# Patient Record
Sex: Female | Born: 1955 | Race: White | Hispanic: No | Smoking: Never smoker
Health system: Southern US, Community
[De-identification: ages and names within clinical notes are randomized; demographics above are authoritative.]

## PROBLEM LIST (undated history)

## (undated) DIAGNOSIS — C801 Malignant (primary) neoplasm, unspecified: Secondary | ICD-10-CM

## (undated) DIAGNOSIS — S060XAA Concussion with loss of consciousness status unknown, initial encounter: Secondary | ICD-10-CM

## (undated) DIAGNOSIS — K219 Gastro-esophageal reflux disease without esophagitis: Secondary | ICD-10-CM

## (undated) HISTORY — DX: Concussion with loss of consciousness status unknown, initial encounter: S06.0XAA

## (undated) HISTORY — PX: DILATION AND CURETTAGE OF UTERUS: SHX78

## (undated) HISTORY — PX: LAPAROSCOPIC OVARIAN: SHX5906

## (undated) HISTORY — PX: ABDOMINAL HYSTERECTOMY: SHX81

## (undated) HISTORY — PX: TONSILLECTOMY: SUR1361

---

## 2001-10-06 ENCOUNTER — Encounter (INDEPENDENT_AMBULATORY_CARE_PROVIDER_SITE_OTHER): Payer: Self-pay

## 2001-10-06 ENCOUNTER — Ambulatory Visit (HOSPITAL_COMMUNITY): Admission: RE | Admit: 2001-10-06 | Discharge: 2001-10-06 | Payer: Self-pay | Admitting: Obstetrics and Gynecology

## 2001-10-06 ENCOUNTER — Encounter (INDEPENDENT_AMBULATORY_CARE_PROVIDER_SITE_OTHER): Payer: Self-pay | Admitting: Specialist

## 2001-10-20 ENCOUNTER — Ambulatory Visit: Admission: RE | Admit: 2001-10-20 | Discharge: 2001-10-20 | Payer: Self-pay | Admitting: Gynecology

## 2001-10-30 ENCOUNTER — Encounter: Payer: Self-pay | Admitting: Obstetrics and Gynecology

## 2001-11-03 ENCOUNTER — Inpatient Hospital Stay (HOSPITAL_COMMUNITY): Admission: RE | Admit: 2001-11-03 | Discharge: 2001-11-05 | Payer: Self-pay | Admitting: Obstetrics and Gynecology

## 2001-11-03 ENCOUNTER — Encounter (INDEPENDENT_AMBULATORY_CARE_PROVIDER_SITE_OTHER): Payer: Self-pay | Admitting: Specialist

## 2001-12-22 ENCOUNTER — Ambulatory Visit: Admission: RE | Admit: 2001-12-22 | Discharge: 2001-12-22 | Payer: Self-pay | Admitting: Gynecology

## 2002-05-04 ENCOUNTER — Ambulatory Visit (HOSPITAL_COMMUNITY): Admission: RE | Admit: 2002-05-04 | Discharge: 2002-05-04 | Payer: Self-pay | Admitting: Family Medicine

## 2002-05-04 ENCOUNTER — Encounter: Payer: Self-pay | Admitting: Family Medicine

## 2002-10-14 ENCOUNTER — Other Ambulatory Visit: Admission: RE | Admit: 2002-10-14 | Discharge: 2002-10-14 | Payer: Self-pay | Admitting: Obstetrics and Gynecology

## 2003-04-20 ENCOUNTER — Other Ambulatory Visit: Admission: RE | Admit: 2003-04-20 | Discharge: 2003-04-20 | Payer: Self-pay | Admitting: Obstetrics and Gynecology

## 2003-10-07 ENCOUNTER — Other Ambulatory Visit: Admission: RE | Admit: 2003-10-07 | Discharge: 2003-10-07 | Payer: Self-pay | Admitting: Obstetrics and Gynecology

## 2004-03-23 ENCOUNTER — Other Ambulatory Visit: Admission: RE | Admit: 2004-03-23 | Discharge: 2004-03-23 | Payer: Self-pay | Admitting: Obstetrics and Gynecology

## 2004-10-25 ENCOUNTER — Other Ambulatory Visit: Admission: RE | Admit: 2004-10-25 | Discharge: 2004-10-25 | Payer: Self-pay | Admitting: Obstetrics and Gynecology

## 2005-05-10 ENCOUNTER — Other Ambulatory Visit: Admission: RE | Admit: 2005-05-10 | Discharge: 2005-05-10 | Payer: Self-pay | Admitting: Obstetrics and Gynecology

## 2008-10-16 ENCOUNTER — Encounter: Admission: RE | Admit: 2008-10-16 | Discharge: 2008-10-16 | Payer: Self-pay | Admitting: Orthopedic Surgery

## 2010-08-12 ENCOUNTER — Encounter: Payer: Self-pay | Admitting: Orthopedic Surgery

## 2010-12-07 NOTE — Op Note (Signed)
[facility]  Patient:    Chelsea Bell, Chelsea Bell Visit Number: [id number] MRN: [id number]          Service Type: GYN Location: 1S X004 01 Attending Physician:  Soledad Gerlach Dictated by:   Rande Brunt Clarke-Pearson, M.D. Proc. Date: 11/03/01 Admit Date:  11/03/2001   CC:         Guy Sandifer. Arleta Creek, M.D.  Telford Nab, R.N.   Operative Report  PREOPERATIVE DIAGNOSIS:  Grade I endometrial adenocarcinoma.  POSTOPERATIVE DIAGNOSIS:  Grade I endometrial adenocarcinoma.  PROCEDURE:  Exploratory laparotomy. total abdominal hysterectomy, right salpingo-oophorectomy.  SURGEON:  Daniel L. Clarke-Pearson, M.D.  ASSISTANT:  Guy Sandifer. Arleta Creek, M.D.  ANESTHESIA:  General with orotracheal tube.  ESTIMATED BLOOD LOSS:  150 cc.  SURGICAL FINDINGS:  At the time of exploratory laparotomy, the upper abdomen including the liver diaphragms, stomach, and spleen were normal.  The omentum was adherent to an umbilical hernia which was easily released.  The pelvis revealed a recent prior left salpingo-oophorectomy.  There was a small cyst on the right ovary.  The uterus is normal size.  On frozen section, the pathologist found that the endometrial carcinoma was grade I with no obvious invasion of the myometrium.  DESCRIPTION OF PROCEDURE:  The patient was brought to the operating room and after satisfactory attainment of general anesthesia, was placed in the modified lithotomy position in [place] stirrups.  The anterior abdominal wall, perineum, and vagina were prepped with Betadine; a Foley catheter was placed, and the patient was draped.  The abdomen was entered through a low midline incision.  Peritoneal washings were obtained from the pelvis.  The abdomen and pelvis were explored with the above-noted findings.  Bookwalter retractor was assembled, and the bowel was packed out of the pelvis.  In the course of packing the bowel, the omentum was released  from its attachments to a small umbilical hernia.  The uterus was grasped with long Kelly clamps, and the round ligaments were divided.  The retroperitoneal space is opened, identifying the vessels and ureter.  The ovarian vessels were skeletonized, clamped, cut, free tied,  and suture ligated on both sides.  The bladder flap was advanced with sharp and blunt dissection.  The uterine vessels were skeletonized, clamped, cut, and suture ligated.  In a stepwise fashion, the paracervical and cardinal ligaments were clamped, cut, and suture ligated. The rectovaginal septum was developed and the cross-clamped incorporating the uterosacral ligaments.  The vaginal angles were transfixed with 0 Vicryl and the central portion of the vagina closed with interrupted figure-of-eight sutures of 0 Vicryl.  The cervix was inspected and had been removed in its entirety.  The specimen was submitted to frozen section, returning, and showing no evidence of myometrial invasion.  Given these very good prognostic features, it was deemed that lymph node dissection would not be performed given the low yield of such a procedure and Grade I endometrial cancer.  The packs and retractors were removed after hemostasis was achieved in the pelvis.  The umbilical hernia was closed with two figure-of-eight sutures of 0 Prolene.  The anterior abdominal wall was then closed in layers, the first being a running mass closure using #1 PDS.  Subcutaneous tissue was irrigated, hemostasis achieved with cautery, and then the subcutaneous tissue was reapproximated using interrupted sutures of 3-0 Vicryl.  The skin was closed with a running subcuticular suture of 3-0 Vicryl.  Steri-Strips were applied. The patient was awakened from anesthesia and taken  to the recovery room in satisfactory condition.  Sponge, needle, and instrument counts correct x 2. Dictated by:   Rande Brunt. Clarke-Pearson, M.D. Attending Physician:  Soledad Gerlach DD:  11/03/01 TD:  11/03/01 Job: [id number]

## 2010-12-07 NOTE — H&P (Signed)
[facility]  Patient:    Chelsea Bell, Chelsea Bell Visit Number: [id number] MRN: [id number]          Service Type: GYN Location: 1S X004 01 Attending Physician:  Soledad Gerlach Dictated by:   Guy Sandifer Arleta Creek, M.D. Admit Date:  11/03/2001                           History and Physical  CHIEF COMPLAINT:  Endometrial adenocarcinoma.  HISTORY OF PRESENT ILLNESS:  The patient is a 55 year old married white female, G0, P0, who underwent hysteroscopy, dilatation and curettage, and laparoscopy with left salpingo-oophorectomy, and ablation of endometriosis, and lysis of adhesions on October 06, 2001, at [facility].  The pathology report was consistent with a mature cystic teratoma and benign fallopian tube of the left ovary.  The endometrial curetting and endometrial polyp were both consistent with a well-differentiated endometrial adenocarcinoma, grade 1, as well as complex atypical hyperplasia.  The patient has been healing well from this surgery.  Consultation with Dr. Stanford Breed has been carried out.  She is now being admitted for total abdominal hysterectomy, right salpingo-oophorectomy, and possible lymph node dissection.  PAST MEDICAL HISTORY: 1. "Borderline" asthma, Dr. Jetty Duhamel. 2. Intermittent constipation. 3. History of infertility. 4. Superficial varicosities of lower extremities.  PAST SURGICAL HISTORY: 1. Laparoscopy and hysteroscopy as above. 2. Tonsillectomy.  MEDICATIONS:  Ambien p.r.n., Claritin D p.r.n., Beconase nasal spray p.r.n., multivitamin, Tylenol daily, Zantac p.r.n.  ALLERGIES:  CODEINE leading to headache, DARVOCET itching, AMOXICILLIN itching and nickel contact dermatitis.  FAMILY HISTORY:  Diabetes in mother and both grandmothers, chronic hypertension in mother and possibly in sisters, heart disease in paternal aunt, asthma in sister, thyroid disease in mother, osteoporosis in her family.  SOCIAL  HISTORY:  The patient denies tobacco, alcohol, or drug abuse.  REVIEW OF SYSTEMS:  Negative except as above.  PHYSICAL EXAMINATION:  VITAL SIGNS:  Height 5 feet and 5-1/2 inches, weight 288 pounds, blood pressure 138/84.  HEENT:  Without thyromegaly.  LUNGS:  Clear to auscultation.  HEART:  Regular rate and rhythm.  BACK:  Without CVA tenderness.  BREASTS:  Without mass, retraction, or discharge.  ABDOMEN:  Obese, soft, and nontender without masses.  PELVIC:  Vulva, vagina, and cervix without lesion.  Uterus normal size and nontender.  Adnexa nontender without masses.  RECTAL:  Without masses.  EXTREMITIES:  Grossly within normal limits.  NEUROLOGIC:  Grossly within normal limits.  ASSESSMENT:  Endometrial adenocarcinoma, grade 1.  PLAN:  Total abdominal hysterectomy and bilateral salpingo-oophorectomy and possible lymph node dissection. Dictated by:   Guy Sandifer Arleta Creek, M.D. Attending Physician:  Soledad Gerlach DD:  11/02/01 TD:  11/02/01 Job: [id number]

## 2010-12-07 NOTE — Consult Note (Signed)
[facility]  Patient:    Chelsea Bell, Chelsea Bell Visit Number: [id number] MRN: [id number]          Service Type: DSU Location: [facility] Attending Physician:  Soledad Gerlach Dictated by:   Rande Brunt. Clarke-Pearson, M.D. Proc. Date: 10/20/01 Admit Date:  10/06/2001 Discharge Date: 10/06/2001   CC:         Fayrene Fearing E. Arleta Creek, M.D.  Telford Nab, R.N.   Consultation Report  A 55 year old white female referred by Dr. Harold Hedge for consultation regarding management of a newly diagnosed grade 1 endometrial carcinoma.  The patient has had several months of menorrhagia recently undergoing an ultrasound which showed an ovarian mass.  Subsequently, the patient underwent laparoscopic left salpingo-oophorectomy, biopsy of the posterior cul-de-sac, and a hysteroscopy with D&C.  Final pathology showed a well differentiated endometrial adenocarcinoma associated with complex atypical hyperplasia. Cul-de-sac biopsies showed endometriosis and the ovary was a mature cystic teratoma.  The patient has had an uncomplicated postoperative course.  PAST MEDICAL HISTORY:  Obesity.  CURRENT MEDICATIONS:  Beconase, Claritin, Ambien, Zantac p.r.n., multivitamin.  ALLERGIES:  AMOXICILLIN, DARVOCET (itch), PERCOCET (headache), CODEINE (headache).  PAST SURGICAL HISTORY:  Laparoscopy, hysteroscopy, D&C, tonsils and adenoidectomy.  PAST OBSTETRICAL HISTORY:  Gravida 0.  FAMILY HISTORY:  Negative for gynecologic, breast, or colon cancer.  REVIEW OF SYSTEMS:  Otherwise negative.  PHYSICAL EXAMINATION  VITAL SIGNS:  Weight 284 pounds, height 5 feet 5 inches.  GENERAL:  The patient is an obese, white female in no acute distress.  HEENT:  Negative.  NECK:  Supple without thyromegaly.  LYMPH:  There is no supraclavicular or inguinal adenopathy.  ABDOMEN:  Soft, nontender.  No mass, organomegaly, ascites, or hernias are noted.  PELVIC:  EGBUS normal.   Vagina is clean, well supported.  Bimanual and rectovaginal examination reveal no masses, induration, or nodularity.  IMPRESSION:  Grade 1 endometrial carcinoma in an obese white female.  Would recommend the patient undergo definitive therapy with total abdominal hysterectomy and right salpingo-oophorectomy, peritoneal washings, and possible pelvic and periaortic lymphadenectomy if she has deep myometrial invasion.  We will coordinate surgery with Dr. Henderson Cloud.  The risks of surgery including hemorrhage, infection, injury to adjacent viscera, thromboembolic complications, and increased risk of wound breakdown and infection in an obese patient were outlined.  The patient is aware of the anesthetic risks as well.  All of her questions are answered.  She wished to proceed with surgery when we can coordinate the surgery with Dr. Harold Hedge. Dictated by:   Rande Brunt. Clarke-Pearson, M.D. Attending Physician:  Soledad Gerlach DD:  10/20/01 TD:  10/21/01 Job: [id number]

## 2010-12-07 NOTE — Discharge Summary (Signed)
[facility]  Patient:    Chelsea Bell, ADCOX Visit Number: [id number] MRN: [id number]          Service Type: GYN Location: 4W 0473 01 Attending Physician:  Soledad Gerlach Dictated by:   Guy Sandifer Arleta Creek, M.D. Admit Date:  11/03/2001 Discharge Date: 11/05/2001                             Discharge Summary  ADMISSION DIAGNOSIS:  Grade I endometrial carcinoma.  DISCHARGE DIAGNOSIS:  Grade I endometrial carcinoma.  PROCEDURES:  On November 03, 2001, total abdominal hysterectomy with right salpingo-oophorectomy.  REASON FOR ADMISSION:  This patient is a 55 year old married white female, G0, P0, status post hysteroscopy D&C and laparoscopic LSO with the postoperative diagnoses of a mature cystic teratoma and well-differentiated endometrial adenocarcinoma, grade I, with complex atypical hyperplasia.  She is being admitted for surgical management.  HOSPITAL COURSE:   The patient was admitted to the hospital and underwent the above procedure with an estimated blood loss of 150 cc.  She tolerated the procedure well.  On the evening of surgery, she was complaining of some itching with her Dilaudid PCA but has good pain relief.  Urine output is clear.  Vital signs are stable.  She is changed to a morphine PCA.  On the first postoperative day, she has good pain relief, complains of some sinus congestion.  She remains afebrile.  Abdomen is soft with good bowel sounds.  On the evening of November 04, 2001, she is tolerating a regular diet but not yet passing flatus.  She has remained afebrile with stable vital signs and good urine output.  On the day of discharge on November 05, 2001, she is passing flatus, ambulating, and doing well.  CONDITION UPON DISCHARGE:  Good.  DIET:  Regular as tolerated.  ACTIVITY:  No lifting, operation of automobiles, or vaginal entry.  SPECIAL INSTRUCTIONS:  She is to call the office for problems including but not  limited to temperature of 101 or greater, persistent nausea and vomiting, increasing pain, or heavy vaginal bleeding.  DISCHARGE MEDICATIONS: 1. Demerol 50 mg, #20, 1 to 2 p.o. q.6h. p.r.n. 2. Ibuprofen 600 mg p.o. q.6h. p.r.n. 3. Multivitamins daily.  FOLLOWUP:  In office in two weeks. Dictated by:   Guy Sandifer Arleta Creek, M.D. Attending Physician:  Soledad Gerlach DD:  11/26/01 TD:  11/29/01 Job: [id number]

## 2010-12-07 NOTE — Consult Note (Signed)
[facility]  Patient:    Chelsea Bell, Chelsea Bell Visit Number: [id number] MRN: [id number]          Service Type: GON Location: GYN Attending Physician:  Jeannette Corpus Dictated by:   Rande Brunt Clarke-Pearson, M.D. Proc. Date: 12/22/01 Admit Date:  12/22/2001 Discharge Date: 12/22/2001   CC:         Fayrene Fearing E. Arleta Creek, M.D.  Telford Nab, R.N.   Consultation Report  A 55 year old white female returns for postoperative follow-up having undergone a total abdominal hysterectomy, bilateral salpingo-oophorectomy, and peritoneal washings on April 15.  Final pathology showed she had a grade 1 endometrial carcinoma which had 1 mm of invasion.  She has had an uncomplicated postoperative course, although she noted a slight amount of erythema lateral to her umbilicus yesterday.  She denies any fever, chills, or any pain in this area.  She denies any vaginal bleeding or discharge.  Her functional status is very good.  REVIEW OF SYSTEMS:  Reviewed and unchanged.  FAMILY HISTORY:  Reviewed and unchanged.  SOCIAL HISTORY:  Reviewed and unchanged.  Patient comes accompanied by her husband.  PHYSICAL EXAMINATION  VITAL SIGNS:  Weight 285 pounds, blood pressure 138/84.  GENERAL:  The patient is a healthy, obese white female in no acute distress.  HEENT:  Negative.  NECK:  Supple without thyromegaly.  LYMPH:  There is no supraclavicular or inguinal adenopathy.  ABDOMEN:  Obese, soft, nontender.  No mass, organomegaly, ascites, or hernias are noted.  Midline incision is healing well.  There is a slight amount of induration and minimal erythema just to the left of her upper pole of her incision.  This may represent an early cellulitis.  PELVIC:  EGBUS normal.  Vagina is clean, well supported.  Cuff is healing nicely.  Bimanual and rectovaginal examinations reveal no masses, induration, or nodularity.  IMPRESSION:  Stage IB grade 1 endometrial  adenocarcinoma.  Patient has had good postoperative recovery and can return to full levels of activity.  Erythema near the wound.  This may be a cellulitis and therefore I will place the patient on Keflex 500 mg t.i.d. for the next week.  She is scheduled to follow up with Dr. Henderson Cloud.  Despite the fact she has a low risk lesion, I recommend she have an examination by Dr. Henderson Cloud every three months in the first year, every four months in the second year, and every six months to finish five years of follow-up.  I would be happy to see the patient back in reconsultation if needed. Dictated by:   Rande Brunt. Clarke-Pearson, M.D. Attending Physician:  Jeannette Corpus DD:  12/22/01 TD:  12/24/01 Job: [id number]

## 2010-12-07 NOTE — Op Note (Signed)
[facility]  Patient:    Chelsea Bell, Chelsea Bell Visit Number: [id number] MRN: [id number]          Service Type: DSU Location: [facility] Attending Physician:  Soledad Gerlach Dictated by:   Guy Sandifer Arleta Creek, M.D. Proc. Date: 10/06/01 Admit Date:  10/06/2001                             Operative Report  PREOPERATIVE DIAGNOSES:       1. Left ovarian cyst.                               2. Menometrorrhagia.  POSTOPERATIVE DIAGNOSES:      1. Bilateral ovarian cysts.                               2. Endometriosis.                               3. Menometrorrhagia.                               4. Pelvic adhesions.  OPERATION:                    1. Laparoscopy with left salpingo-oophorectomy,                                  resection and ablation of endometriosis, and                                  lysis of adhesions.                               2. Hysteroscopy with resection of endometrial                                  polyp and sharp curettage.  SURGEON:                      Guy Sandifer. Arleta Creek, M.D.  ANESTHESIA:                   General with endotracheal intubation - Raul Del, M.D.  ESTIMATED BLOOD LOSS:         Less than 100 cc.  I&Os:                         Sorbitol distending media, 200 cc deficit with                               some of that being on the floor.  INDICATIONS AND CONSENT:      The patient is a 55 year old married  white female with a history of worsening menometrorrhagia.  Ultrasound reveals possibility of endometrial polypoid-type structures.  There is also a left ovarian cyst, possibly consistent with a dermoid.  Laparoscopy with probable left salpingo-oophorectomy, hysteroscopy with resectoscope, and D&C was discussed with the patient.  Potential risks and complications have been discussed including, but not limited to infection, bowel, bladder, or ureteral damage,  bleeding requiring transfusion of blood products with possible transfusion reaction, HIV and hepatitis acquisition, DVT, PE, pneumonia, hysterectomy, intrauterine synechia formation.  All questions have been answered and consent signed on the chart.  FINDINGS:                     There are multiple polypoid-type structures in the uterine cavity.  Abdominally, the upper abdomen appeared grossly normal. The uterus is normal in size and contour.  The fallopian tubes are normal bilaterally.  The right ovary contains an approximately 2 cm smooth translucent cyst.  The left ovary contains a 3 cm smooth translucent cyst. There was a small adhesion from the bowel to the left ovary.  There is a heavy implant of endometriosis in the apex of the posterior cul-de-sac.  There are two small implants of endometriosis in the right aspect of the posterior cul-de-sac.  There are some adhesions of the bowel to the right pelvic brim. Anterior cul-de-sac is clean.  DESCRIPTION OF PROCEDURE:     The patient is taken to the operating room and placed in the dorsal supine position where general anesthesia was induced via endotracheal intubation.  She was then placed in the dorsolithotomy position where she was prepped abdominally and vaginally.  The bladder was straight catheterized and she was draped in a sterile fashion.  The patient received preoperative IV antibiotics.  A bivalve speculum was placed and the anterior cervical lip was injected with 1% Xylocaine and then grasped with a single tooth tenaculum.  A paracervical block was then placed in the 2, 4, 5, 7, 8, and 10 oclock positions with approximately 20 cc total of 1% Xylocaine.  The surface was gently and progressively dilated to a 33 Pratt dilator.  The resectoscope was then placed in the cervical os and advanced under direct visualization using sorbitol distending media.  Due to the bleeding the patient had, it was difficult to get a  consistent clear picture of the entire endometrial cavity at one time.  The picture would intermittent clear and then become bloody again.  Therefore, the hysteroscope was withdrawn and sharp curettage was carried out.  Reinspection reveals a remaining polypoid-type structure in the mid posterior endometrial wall.  This was resected with the resectoscope.  Sharp curettage was again carried out and reinspection reveals the cavity to be clean.  The single tooth tenaculum was replaced with a Hulka tenaculum and attention was turned to the abdomen.  An infraumbilical incision was made and a 10-11 disposable trocar sleeve was placed on the first attempt without difficulty.  Inspection with the laparoscope reveals good placement and no damage to surrounding structures is noted.  Pneumoperitoneum is induced and then a suprapubic and later left lower quadrant incisions are made after transillumination and then a 5 mm nondisposable trocar sleeves are placed under direct visualization without difficulty.  The above findings are noted.  The implant of endometriosis in the posterior cul-de-sac is sharply resected and sent for pathology.  Bipolar cautery was used to obtain hemostasis as well as to cauterize the remaining implants of endometriosis.  The adhesion of  the left ovary was taken down. The right ovary is aspirated for serous fluid which is sent for cytology.  The adhesions of the right pelvic brim are taken down sharply and cautery is used for hemostasis.  Careful inspection of the left ovary reveals the infundibulopelvic ligament to be very mobile.  The ureter is well clear of the area of surgery.  Bipolar cautery was then used to take down the infundibulopelvic ligament followed by the mesosalpinx, and the fallopian tube, and the utero-ovarian ligament in a step-wise fashion using a scissor to release the pedicles.  The cyst on the ovary is then aspirated to debulk the ovary.  This is serous  fluid which is also sent for cytology.  Then, using the 5 mm laparoscope through the left lower trocar sleeve, the Endocatch was used to remove the ovary through the umbilical incision.  Reinspection with the operative laparoscope reveals good hemostasis.  Excess fluid is removed.  Inferior trocar sleeves are removed.  Pneumoperitoneum is reduced and no bleeding is noted from any site.  The umbilical trocar sleeve is removed.  A 0 Vicryl is used to reapproximate the fascia and the umbilical incision with care being taken not to pick up any underlying structures.  The skin incisions were closed with subcuticular 3-0 Vicryl suture.  One-half percent plain Marcaine was injected into the incisions.  The Hulka tenaculum was removed and good hemostasis was noted.  All counts were correct.  The patient was awakened and taken to the recovery room in stable condition. Dictated by:   Guy Sandifer Arleta Creek, M.D. Attending Physician:  Soledad Gerlach DD:  10/06/01 TD:  10/07/01 Job: [id number]

## 2011-12-04 ENCOUNTER — Telehealth (INDEPENDENT_AMBULATORY_CARE_PROVIDER_SITE_OTHER): Payer: Self-pay | Admitting: *Deleted

## 2011-12-04 NOTE — Telephone Encounter (Signed)
Patient's insurance has changed and she is now getting her medication at [facility] in [place] instead of mail order. Chelsea Bell is needing a refill on her Omeprazole 40 mg. Please check on this she could not remember the name only the first 2 letters. The medication is for acid reflux. Patient's return phone number is [phone].

## 2011-12-11 NOTE — Telephone Encounter (Signed)
Information was put in wrong chart.

## 2013-07-25 ENCOUNTER — Other Ambulatory Visit (HOSPITAL_COMMUNITY): Payer: Self-pay | Admitting: Chiropractic Medicine

## 2013-07-25 DIAGNOSIS — M79641 Pain in right hand: Secondary | ICD-10-CM

## 2013-07-25 DIAGNOSIS — M25562 Pain in left knee: Secondary | ICD-10-CM

## 2013-07-26 ENCOUNTER — Ambulatory Visit (HOSPITAL_COMMUNITY)
Admission: RE | Admit: 2013-07-26 | Discharge: 2013-07-26 | Disposition: A | Discharge status: Home / Self Care | Payer: BC Managed Care – PPO | Source: Ambulatory Visit | Attending: Chiropractic Medicine | Admitting: Chiropractic Medicine

## 2013-07-26 ENCOUNTER — Other Ambulatory Visit (HOSPITAL_COMMUNITY): Payer: Self-pay | Admitting: Chiropractic Medicine

## 2013-07-26 DIAGNOSIS — M259 Joint disorder, unspecified: Secondary | ICD-10-CM | POA: Insufficient documentation

## 2013-07-26 DIAGNOSIS — R52 Pain, unspecified: Secondary | ICD-10-CM

## 2013-07-26 DIAGNOSIS — M25539 Pain in unspecified wrist: Secondary | ICD-10-CM | POA: Insufficient documentation

## 2013-07-26 DIAGNOSIS — M79609 Pain in unspecified limb: Secondary | ICD-10-CM | POA: Insufficient documentation

## 2014-05-27 ENCOUNTER — Encounter (HOSPITAL_COMMUNITY): Payer: Self-pay | Admitting: *Deleted

## 2014-06-09 ENCOUNTER — Encounter (HOSPITAL_COMMUNITY)
Admission: RE | Disposition: A | Discharge status: Home / Self Care | Payer: BC Managed Care – PPO | Source: Ambulatory Visit | Attending: Gastroenterology

## 2014-06-09 ENCOUNTER — Ambulatory Visit (HOSPITAL_COMMUNITY)
Admission: RE | Admit: 2014-06-09 | Discharge: 2014-06-09 | Disposition: A | Discharge status: Home / Self Care | Payer: BC Managed Care – PPO | Source: Ambulatory Visit | Attending: Gastroenterology | Admitting: Gastroenterology

## 2014-06-09 ENCOUNTER — Ambulatory Visit (HOSPITAL_COMMUNITY): Payer: BC Managed Care – PPO | Admitting: Anesthesiology

## 2014-06-09 ENCOUNTER — Encounter (HOSPITAL_COMMUNITY): Payer: Self-pay | Admitting: *Deleted

## 2014-06-09 DIAGNOSIS — K5730 Diverticulosis of large intestine without perforation or abscess without bleeding: Secondary | ICD-10-CM | POA: Diagnosis not present

## 2014-06-09 DIAGNOSIS — Z1211 Encounter for screening for malignant neoplasm of colon: Secondary | ICD-10-CM | POA: Insufficient documentation

## 2014-06-09 DIAGNOSIS — K219 Gastro-esophageal reflux disease without esophagitis: Secondary | ICD-10-CM | POA: Diagnosis not present

## 2014-06-09 HISTORY — PX: COLONOSCOPY WITH PROPOFOL: SHX5780

## 2014-06-09 HISTORY — DX: Gastro-esophageal reflux disease without esophagitis: K21.9

## 2014-06-09 HISTORY — DX: Malignant (primary) neoplasm, unspecified: C80.1

## 2014-06-09 SURGERY — COLONOSCOPY WITH PROPOFOL
Anesthesia: Monitor Anesthesia Care

## 2014-06-09 MED ORDER — LACTATED RINGERS IV SOLN
INTRAVENOUS | Status: DC
  Administered 2014-06-09: 1000 mL via INTRAVENOUS

## 2014-06-09 MED ORDER — LIDOCAINE HCL (CARDIAC) 20 MG/ML IV SOLN
INTRAVENOUS | Status: AC
  Filled 2014-06-09: qty 5

## 2014-06-09 MED ORDER — SODIUM CHLORIDE 0.9 % IV SOLN: INTRAVENOUS | Status: DC

## 2014-06-09 MED ORDER — KETAMINE HCL 10 MG/ML IJ SOLN
INTRAMUSCULAR | Status: DC | PRN
  Administered 2014-06-09 (×2): 10 mg via INTRAVENOUS

## 2014-06-09 MED ORDER — LIDOCAINE HCL 1 % IJ SOLN
INTRAMUSCULAR | Status: DC | PRN
  Administered 2014-06-09: 50 mg via INTRADERMAL

## 2014-06-09 MED ORDER — MIDAZOLAM HCL 5 MG/5ML IJ SOLN
INTRAMUSCULAR | Status: DC | PRN
  Administered 2014-06-09 (×2): 1 mg via INTRAVENOUS

## 2014-06-09 MED ORDER — PROPOFOL INFUSION 10 MG/ML OPTIME
INTRAVENOUS | Status: DC | PRN
  Administered 2014-06-09: 140 ug/kg/min via INTRAVENOUS

## 2014-06-09 MED ORDER — PROPOFOL 10 MG/ML IV BOLUS
INTRAVENOUS | Status: AC
  Filled 2014-06-09: qty 20

## 2014-06-09 MED ORDER — MIDAZOLAM HCL 2 MG/2ML IJ SOLN
INTRAMUSCULAR | Status: AC
  Filled 2014-06-09: qty 2

## 2014-06-09 SURGICAL SUPPLY — 22 items

## 2014-06-09 NOTE — Anesthesia Postprocedure Evaluation (Signed)
  Anesthesia Post-op Note  Patient: Chelsea Bell  Procedure(s) Performed: Procedure(s) (LRB): COLONOSCOPY WITH PROPOFOL (N/A)  Patient Location: PACU  Anesthesia Type: MAC  Level of Consciousness: awake and alert   Airway and Oxygen Therapy: Patient Spontanous Breathing  Post-op Pain: mild  Post-op Assessment: Post-op Vital signs reviewed, Patient's Cardiovascular Status Stable, Respiratory Function Stable, Patent Airway and No signs of Nausea or vomiting  Last Vitals:  Filed Vitals:   06/09/14 1330  BP: 158/60  Temp: 36.6 C    Post-op Vital Signs: stable   Complications: No apparent anesthesia complications

## 2014-06-09 NOTE — H&P (Signed)
Chelsea Bell is an 58 y.o. female.   Chief Complaint: Colon cancer screening HPI: Pleasant 58 year old female with history of endometrial cancer and family history of colon polyps in her father, underwent a negative colonoscopy by me approximately 11 years ago in October 2004, and presents at this time for updated colon cancer screening. She does have sporadic abdominal cramps and diarrhea with multiple bowel movements in the early part of the day from time to time.  Past Medical History  Diagnosis Date  . Cancer     endometrial cancer- surgery only  . GERD (gastroesophageal reflux disease)     Past Surgical History  Procedure Laterality Date  . Tonsillectomy    . Dilation and curettage of uterus    . Abdominal hysterectomy    . Laparoscopic ovarian      diagnostic with 1 ovary removed , then Hysterectomy(endometrial cancer) with other ovary removed    History reviewed. No pertinent family history. Social History:  reports that she has never smoked. She does not have any smokeless tobacco history on file. She reports that she does not drink alcohol or use illicit drugs.  Allergies:  Allergies  Allergen Reactions  . Dilaudid [Hydromorphone Hcl] Itching  . Percocet [Oxycodone-Acetaminophen] Itching  . Amoxicillin Itching    + headache.   . Codeine Itching    + Headache.   . Nickel Dermatitis    "itching, skin blisters"    Medications Prior to Admission  Medication Sig Dispense Refill  . ibuprofen (ADVIL,MOTRIN) 200 MG tablet Take 400-600 mg by mouth every 6 (six) hours as needed for headache or mild pain.    Marland Kitchen OVER THE COUNTER MEDICATION Take 1 tablet by mouth at bedtime. [place] PM-- (helps get a good nights sleep)    . OVER THE COUNTER MEDICATION Take 1 tablet by mouth every morning. [place] AM-- (helps get the day started/focus)    . OVER THE COUNTER MEDICATION Take 1 each by mouth every morning. Onguard oil (doterra brand) --puts a drop on tongue    . OVER THE  COUNTER MEDICATION Take 1 drop by mouth every morning. Frankinsense (doterra Brand) --one drop on tongue.    Marland Kitchen OVER THE COUNTER MEDICATION Apply 1 application topically at bedtime. Margirine, lavender and peppermint. (doterra brand)--Rubs on legs at night.    Marland Kitchen OVER THE COUNTER MEDICATION Apply 1 application topically at bedtime. Deep blue (doterra brand)-- rubs on lower back    . peppermint oil liquid Take 1 drop by mouth at bedtime. Doterra Brand. --One drop on tongue.    . polyvinyl alcohol (LIQUIFILM TEARS) 1.4 % ophthalmic solution Place 1 drop into both eyes every morning.    . sodium chloride (OCEAN) 0.65 % SOLN nasal spray Place 2 sprays into both nostrils daily as needed for congestion.      No results found for this or any previous visit (from the past 48 hour(s)). No results found.  ROS see history of present illness  There were no vitals taken for this visit. Physical Exam this is a severely overweight, morbidly obese pleasant, articulate Caucasian female. She is anicteric and without pallor. The chest is clear to auscultation, with good air movement. Heart sounds are normal, no murmur or arrhythmia appreciated. Abdomen is severely obese but without guarding, evident mass effect, or tenderness. Neurologically, the patient appears cognitively intact and without overt cranial nerve or focal motor deficits.  Assessment/Plan Patient at slightly increased risk for colon cancer based on her family history of colon  polyps. Also, probable IBS.  Plan: Colonoscopy under propofol sedation by anesthesia. I do not feel that the patient's lower GI symptoms are sufficiently suspicious for microscopic colitis to necessitate random mucosal biopsies.  Emmani Lesueur V 06/09/2014, 1:26 PM

## 2014-06-09 NOTE — Discharge Instructions (Signed)
Colonoscopy, Care After °These instructions give you information on caring for yourself after your procedure. Your doctor may also give you more specific instructions. Call your doctor if you have any problems or questions after your procedure. °HOME CARE °· Do not drive for 24 hours. °· Do not sign important papers or use machinery for 24 hours. °· You may shower. °· You may go back to your usual activities, but go slower for the first 24 hours. °· Take rest breaks often during the first 24 hours. °· Walk around or use warm packs on your belly (abdomen) if you have belly cramping or gas. °· Drink enough fluids to keep your pee (urine) clear or pale yellow. °· Resume your normal diet. Avoid heavy or fried foods. °· Avoid drinking alcohol for 24 hours or as told by your doctor. °· Only take medicines as told by your doctor. °If a tissue sample (biopsy) was taken during the procedure:  °· Do not take aspirin or blood thinners for 7 days, or as told by your doctor. °· Do not drink alcohol for 7 days, or as told by your doctor. °· Eat soft foods for the first 24 hours. °GET HELP IF: °You still have a small amount of blood in your poop (stool) 2-3 days after the procedure. °GET HELP RIGHT AWAY IF: °· You have more than a small amount of blood in your poop. °· You see clumps of tissue (blood clots) in your poop. °· Your belly is puffy (swollen). °· You feel sick to your stomach (nauseous) or throw up (vomit). °· You have a fever. °· You have belly pain that gets worse and medicine does not help. °MAKE SURE YOU: °· Understand these instructions. °· Will watch your condition. °· Will get help right away if you are not doing well or get worse. °Document Released: 08/10/2010 Document Revised: 07/13/2013 Document Reviewed: 03/15/2013 °ExitCare® Patient Information ©2015 [employer]. This information is not intended to replace advice given to you by your health care provider. Make sure you discuss any questions you have with  your health care provider. ° °

## 2014-06-09 NOTE — Anesthesia Preprocedure Evaluation (Addendum)
Anesthesia Evaluation  Patient identified by MRN, date of birth, ID band Patient awake    Reviewed: Allergy & Precautions, H&P , NPO status , Patient's Chart, lab work & pertinent test results  Airway Mallampati: II  TM Distance: >3 FB Neck ROM: Full    Dental no notable dental hx.    Pulmonary neg pulmonary ROS,  breath sounds clear to auscultation  Pulmonary exam normal       Cardiovascular negative cardio ROS  Rhythm:Regular Rate:Normal     Neuro/Psych negative neurological ROS  negative psych ROS   GI/Hepatic Neg liver ROS, GERD-  ,  Endo/Other  Morbid obesity  Renal/GU negative Renal ROS  negative genitourinary   Musculoskeletal negative musculoskeletal ROS (+)   Abdominal   Peds negative pediatric ROS (+)  Hematology negative hematology ROS (+)   Anesthesia Other Findings   Reproductive/Obstetrics negative OB ROS                            Anesthesia Physical Anesthesia Plan  ASA: III  Anesthesia Plan: MAC   Post-op Pain Management:    Induction: Intravenous  Airway Management Planned: Nasal Cannula  Additional Equipment:   Intra-op Plan:   Post-operative Plan:   Informed Consent: I have reviewed the patients History and Physical, chart, labs and discussed the procedure including the risks, benefits and alternatives for the proposed anesthesia with the patient or authorized representative who has indicated his/her understanding and acceptance.   Dental advisory given  Plan Discussed with: CRNA and Surgeon  Anesthesia Plan Comments:        Anesthesia Quick Evaluation

## 2014-06-09 NOTE — Op Note (Signed)
[facility] [place], [address]   COLONOSCOPY PROCEDURE REPORT  PATIENT: Chelsea Bell, Chelsea Bell  MR#: [id number] BIRTHDATE: Aug 09, 1955 , 9  yrs. old GENDER: female ENDOSCOPIST: Ronald Lobo, MD REFERRED BY:  Minette Brine, MD PROCEDURE DATE:  06/17/2014 PROCEDURE:   Colonoscopy ASA CLASS:   III INDICATIONS:Screening--standard MEDICATIONS: propofol  DESCRIPTION OF PROCEDURE:   After the risks and benefits and of the procedure were explained, informed consent was obtained.  Digital exam normal.         The Pentax Ped Colon [id number]  endoscope was introduced through the anus and advanced to the terminal ileum.      .  The quality of the prep was excellent      .  The instrument was then slowly withdrawn as the colon was fully examined.   Apart from moderate sigmoid diverticulosis, this was a normal examination.  No polyps, masses, or colitis or vascular malformations were seen.          Retroflexion normal.          The scope was then withdrawn from the patient and the procedure completed. No biopsies were obtained.  WITHDRAWAL TIME: 11 minutes  COMPLICATIONS: There were no immediate complications.  ENDOSCOPIC IMPRESSION: Sigmoid diverticulosis.  RECOMMENDATIONS: Repeat colonoscopy 10 years.  REPEAT EXAM: Colonoscopy November 2025  cc:  Dr. Corliss Blacker  _______________________________ eSigned:  Ronald Lobo, MD 2014/06/17 3:25 PM   CPT CODES: ICD CODES:  The ICD and CPT codes recommended by this software are interpretations from the data that the clinical staff has captured with the software.  The verification of the translation of this report to the ICD and CPT codes and modifiers is the sole responsibility of the health care institution and practicing physician where this report was generated.  [employer]. will not be held responsible for the validity of the ICD and CPT codes included on this report.  AMA  assumes no liability for data contained or not contained herein. CPT is a Designer, television/film set of the [employer].

## 2014-06-09 NOTE — Transfer of Care (Signed)
Immediate Anesthesia Transfer of Care Note  Patient: Chelsea Bell  Procedure(s) Performed: Procedure(s): COLONOSCOPY WITH PROPOFOL (N/A)  Patient Location: PACU and Endoscopy Unit  Anesthesia Type:MAC  Level of Consciousness: awake, alert , oriented, patient cooperative and responds to stimulation  Airway & Oxygen Therapy: Patient Spontanous Breathing and Patient connected to face mask oxygen  Post-op Assessment: Report given to PACU RN, Post -op Vital signs reviewed and stable and Patient moving all extremities  Post vital signs: Reviewed and stable  Complications: No apparent anesthesia complications

## 2014-06-10 ENCOUNTER — Encounter (HOSPITAL_COMMUNITY): Payer: Self-pay | Admitting: Gastroenterology

## 2014-07-18 ENCOUNTER — Other Ambulatory Visit: Payer: Self-pay | Admitting: Obstetrics and Gynecology

## 2014-07-20 LAB — CYTOLOGY - PAP

## 2014-10-05 ENCOUNTER — Ambulatory Visit (INDEPENDENT_AMBULATORY_CARE_PROVIDER_SITE_OTHER): Payer: 59 | Admitting: Emergency Medicine

## 2014-10-05 ENCOUNTER — Ambulatory Visit (INDEPENDENT_AMBULATORY_CARE_PROVIDER_SITE_OTHER): Payer: 59

## 2014-10-05 VITALS — BP 141/95 | HR 65 | Temp 97.6°F | Resp 18 | Ht 66.25 in | Wt 297.0 lb

## 2014-10-05 DIAGNOSIS — R109 Unspecified abdominal pain: Secondary | ICD-10-CM

## 2014-10-05 LAB — POCT CBC
GRANULOCYTE PERCENT: 57.7 % (ref 37–80)
HEMATOCRIT: 43.3 % (ref 37.7–47.9)
Hemoglobin: 13.6 g/dL (ref 12.2–16.2)
Lymph, poc: 2.7 (ref 0.6–3.4)
MCH: 28.8 pg (ref 27–31.2)
MCHC: 31.3 g/dL — AB (ref 31.8–35.4)
MCV: 92.1 fL (ref 80–97)
MID (cbc): 0.1 (ref 0–0.9)
MPV: 7.1 fL (ref 0–99.8)
POC Granulocyte: 3.8 (ref 2–6.9)
POC LYMPH %: 41.3 % (ref 10–50)
POC MID %: 1 % (ref 0–12)
Platelet Count, POC: 287 10*3/uL (ref 142–424)
RBC: 4.71 M/uL (ref 4.04–5.48)
RDW, POC: 15.5 %
WBC: 6.6 10*3/uL (ref 4.6–10.2)

## 2014-10-05 LAB — POCT URINALYSIS DIPSTICK
Bilirubin, UA: NEGATIVE
Glucose, UA: NEGATIVE
Ketones, UA: NEGATIVE
Leukocytes, UA: NEGATIVE
Nitrite, UA: NEGATIVE
PH UA: 5.5
PROTEIN UA: NEGATIVE
RBC UA: NEGATIVE
SPEC GRAV UA: 1.01
UROBILINOGEN UA: 0.2

## 2014-10-05 LAB — POCT UA - MICROSCOPIC ONLY
Bacteria, U Microscopic: NEGATIVE
CASTS, UR, LPF, POC: NEGATIVE
Crystals, Ur, HPF, POC: NEGATIVE
Epithelial cells, urine per micros: NEGATIVE
MUCUS UA: NEGATIVE
RBC, urine, microscopic: NEGATIVE
WBC, UR, HPF, POC: NEGATIVE
YEAST UA: NEGATIVE

## 2014-10-05 NOTE — Progress Notes (Signed)
[facility] [address] [place] [address] [phone]  Date:  10/05/2014   Name:  Chelsea Bell   DOB:  1955/09/04   MRN:  [id number]  PCP:  No primary care provider on file.    Chief Complaint: Abdominal Pain   History of Present Illness:  Chelsea Bell is a 59 y.o. very pleasant female patient who presents with the following:  Mid morning today developed a very sharp pain in RLQ and across lower abdomen Worse with potholes and tracks No nausea or vomiting.  No stool change.  The patient has no complaint of blood, mucous, or pus in her stools. Worse when up and around.  Less when sitting or laying. No dysuria, urgency or frequency.  Some incontinence of urine No fever or chills.  History [facility] Colonoscopy in December  No appetite No improvement with over the counter medications or other home remedies.  Denies other complaint or health concern today.     There are no active problems to display for this patient.   Past Medical History  Diagnosis Date  . Cancer     endometrial cancer- surgery only  . GERD (gastroesophageal reflux disease)     Past Surgical History  Procedure Laterality Date  . Tonsillectomy    . Dilation and curettage of uterus    . Abdominal hysterectomy    . Laparoscopic ovarian      diagnostic with 1 ovary removed , then Hysterectomy(endometrial cancer) with other ovary removed  . Colonoscopy with propofol N/A 06/09/2014    Procedure: COLONOSCOPY WITH PROPOFOL;  Surgeon: Cleotis Nipper, MD;  Location: [facility] END[facility]COPY;  Service: Endoscopy;  Laterality: N/A;    History  Substance Use Topics  . Smoking status: Never Smoker   . Smokeless tobacco: Not on file  . Alcohol Use: No    History reviewed. No pertinent family history.  Allergies  Allergen Reactions  . Dilaudid [Hydromorphone Hcl] Itching  . Percocet [Oxycodone-Acetaminophen] Itching  . Amoxicillin Itching    + headache.   . Codeine  Itching    + Headache.   . Nickel Dermatitis    "itching, skin blisters"    Medication list has been reviewed and updated.  Current Outpatient Prescriptions on File Prior to Visit  Medication Sig Dispense Refill  . ibuprofen (ADVIL,MOTRIN) 200 MG tablet Take 400-600 mg by mouth every 6 (six) hours as needed for headache or mild pain.    Marland Kitchen OVER THE COUNTER MEDICATION Take 1 tablet by mouth at bedtime. [place] PM-- (helps get a good nights sleep)    . OVER THE COUNTER MEDICATION Take 1 tablet by mouth every morning. [place] AM-- (helps get the day started/focus)    . OVER THE COUNTER MEDICATION Take 1 each by mouth every morning. Onguard oil (doterra brand) --puts a drop on tongue    . OVER THE COUNTER MEDICATION Take 1 drop by mouth every morning. Frank[place]ense (doterra Brand) --one drop on tongue.    Marland Kitchen OVER THE COUNTER MEDICATION Apply 1 application topically at bedtime. Margirine, lavender and peppermint. (doterra brand)--Rubs on legs at night.    Marland Kitchen OVER THE COUNTER MEDICATION Apply 1 application topically at bedtime. Deep blue (doterra brand)-- rubs on lower back    . peppermint oil liquid Take 1 drop by mouth at bedtime. Doterra Brand. --One drop on tongue.    . polyvinyl alcohol (LIQUIFILM TEARS) 1.4 % ophthalmic solution Place 1 drop into both eyes every morning.    Marland Kitchen  sodium chloride (OCEAN) 0.65 % SOLN nasal spray Place 2 sprays into both nostrils daily as needed for congestion.     No current facility-administered medications on file prior to visit.    Review of Systems:  As per HPI, otherwise negative.    Physical Examination: Filed Vitals:   10/05/14 1400  BP: 141/95  Pulse: 65  Temp: 97.6 F (36.4 C)  Resp: 18   Filed Vitals:   10/05/14 1400  Height: 5' 6.25" (1.683 m)  Weight: 297 lb (134.718 kg)   Body mass index is 47.56 kg/(m^2). Ideal Body Weight: Weight in (lb) to have BMI = 25: 155.7  GEN: morbidly obese, NAD, Non-toxic, A & O x 3 HEENT: Atraumatic,  Normocephalic. Neck supple. No masses, No LAD. Ears and Nose: No external deformity. CV: RRR, No M/G/R. No JVD. No thrill. No extra heart sounds. PULM: CTA B, no wheezes, crackles, rhonchi. No retractions. No resp. distress. No accessory muscle use. ABD: S, NT, ND, +BS. No rebound. No HSM. EXTR: No c/c/e NEURO Normal gait.  PSYCH: Normally interactive. Conversant. Not depressed or anxious appearing.  Calm demeanor.    Assessment and Plan: Abdominal pain Follow up tomorrow or sooner if worse Likely constipation  Signed,  Ellison Carwin, MD   Results for orders placed or performed in visit on 10/05/14  POCT CBC  Result Value Ref Range   WBC 6.6 4.6 - 10.2 K/uL   Lymph, poc 2.7 0.6 - 3.4   POC LYMPH PERCENT 41.3 10 - 50 %L   MID (cbc) 0.1 0 - 0.9   POC MID % 1.0 0 - 12 %M   POC Granulocyte 3.8 2 - 6.9   Granulocyte percent 57.7 37 - 80 %G   RBC 4.71 4.04 - 5.48 M/uL   Hemoglobin 13.6 12.2 - 16.2 g/dL   HCT, POC 43.3 37.7 - 47.9 %   MCV 92.1 80 - 97 fL   MCH, POC 28.8 27 - 31.2 pg   MCHC 31.3 (A) 31.8 - 35.4 g/dL   RDW, POC 15.5 %   Platelet Count, POC 287 142 - 424 K/uL   MPV 7.1 0 - 99.8 fL  POCT UA - Microscopic Only  Result Value Ref Range   WBC, Ur, HPF, POC neg    RBC, urine, microscopic neg    Bacteria, U Microscopic neg    Mucus, UA neg    Epithelial cells, urine per micros neg    Crystals, Ur, HPF, POC neg    Casts, Ur, LPF, POC neg    Yeast, UA neg   POCT urinalysis dipstick  Result Value Ref Range   Color, UA yellow    Clarity, UA clear    Glucose, UA neg    Bilirubin, UA neg    Ketones, UA neg    Spec Grav, UA 1.010    Blood, UA neg    pH, UA 5.5    Protein, UA neg    Urobilinogen, UA 0.2    Nitrite, UA neg    Leukocytes, UA Negative     [facility] reading (PRIMARY) by  Dr. Ouida Sills.  negative.

## 2015-06-11 ENCOUNTER — Emergency Department (HOSPITAL_COMMUNITY)
Admission: EM | Admit: 2015-06-11 | Discharge: 2015-06-11 | Disposition: A | Discharge status: Home / Self Care | Payer: 59 | Source: Home / Self Care | Attending: Emergency Medicine | Admitting: Emergency Medicine

## 2015-06-11 ENCOUNTER — Encounter (HOSPITAL_COMMUNITY): Payer: Self-pay | Admitting: Emergency Medicine

## 2015-06-11 DIAGNOSIS — L03116 Cellulitis of left lower limb: Secondary | ICD-10-CM

## 2015-06-11 MED ORDER — CEPHALEXIN 500 MG PO CAPS: 500.0000 mg | ORAL_CAPSULE | Freq: Four times a day (QID) | ORAL | Status: DC

## 2015-06-11 NOTE — ED Notes (Signed)
Left leg with tenderness, knot on inside of left lower leg, slight redness and swelling in leg.

## 2015-06-11 NOTE — Discharge Instructions (Signed)
Take full course of antibiotics (Keflex). Keep leg elevated as much as possible. You may use Tylenol/Ibuprofen for pain relief. Follow up with this facility if symptoms do not improve in the next couple days.   Cellulitis Cellulitis is an infection of the skin and the tissue beneath it. The infected area is usually red and tender. Cellulitis occurs most often in the arms and lower legs.  CAUSES  Cellulitis is caused by bacteria that enter the skin through cracks or cuts in the skin. The most common types of bacteria that cause cellulitis are staphylococci and streptococci. SIGNS AND SYMPTOMS   Redness and warmth.  Swelling.  Tenderness or pain.  Fever. DIAGNOSIS  Your health care provider can usually determine what is wrong based on a physical exam. Blood tests may also be done. TREATMENT  Treatment usually involves taking an antibiotic medicine. HOME CARE INSTRUCTIONS   Take your antibiotic medicine as directed by your health care provider. Finish the antibiotic even if you start to feel better.  Keep the infected arm or leg elevated to reduce swelling.  Apply a warm cloth to the affected area up to 4 times per day to relieve pain.  Take medicines only as directed by your health care provider.  Keep all follow-up visits as directed by your health care provider. SEEK MEDICAL CARE IF:   You notice red streaks coming from the infected area.  Your red area gets larger or turns dark in color.  Your bone or joint underneath the infected area becomes painful after the skin has healed.  Your infection returns in the same area or another area.  You notice a swollen bump in the infected area.  You develop new symptoms.  You have a fever. SEEK IMMEDIATE MEDICAL CARE IF:   You feel very sleepy.  You develop vomiting or diarrhea.  You have a general ill feeling (malaise) with muscle aches and pains.   This information is not intended to replace advice given to you by your  health care provider. Make sure you discuss any questions you have with your health care provider.   Document Released: 04/17/2005 Document Revised: 03/29/2015 Document Reviewed: 09/23/2011 Elsevier Interactive Patient Education [employer].

## 2015-06-11 NOTE — ED Provider Notes (Signed)
CSN: [id number]     Arrival date & time 06/11/15  1916 History   First MD Initiated Contact with Patient 06/11/15 1930     Chief Complaint  Patient presents with  . Leg Pain   (Consider location/radiation/quality/duration/timing/severity/associated sxs/prior Treatment) HPI  Chelsea Bell is a 59 y.o. female with history of varicose veins presenting to the urgent care with left leg pain. Patient states that she has been "retaining fluid" for the last 3-4 days in her legs, which happens to her often. Today she noticed a spot on her left lower leg that "feels like a knot." The area is mildly painful when touched. It is slightly erythematous. Denies injury to the area. Patient has tried OTC pain reliever and essential oils. 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 Denies history of DVT, recent surgeries, or current cancer. Denies fever, chills, SOB, chest pain, nausea, vomiting, numbness, tingling, or gait disturbance.   Past Medical History  Diagnosis Date  . Cancer [facility])     endometrial cancer- surgery only  . GERD (gastroesophageal reflux disease)    Past Surgical History  Procedure Laterality Date  . Tonsillectomy    . Dilation and curettage of uterus    . Abdominal hysterectomy    . Laparoscopic ovarian      diagnostic with 1 ovary removed , then Hysterectomy(endometrial cancer) with other ovary removed  . Colonoscopy with propofol N/A 06/09/2014    Procedure: COLONOSCOPY WITH PROPOFOL;  Surgeon: Cleotis Nipper, MD;  Location: [facility];  Service: Endoscopy;  Laterality: N/A;   No family history on file. Social History  Substance Use Topics  . Smoking status: Never Smoker   . Smokeless tobacco: None  . Alcohol Use: No   OB History    No data available     Review of Systems  Constitutional: Negative for fever, chills and appetite change.  HENT: Negative for congestion and sore throat.   Respiratory: Negative for cough, chest  tightness, shortness of breath and wheezing.   Cardiovascular: Positive for leg swelling. Negative for chest pain and palpitations.  Gastrointestinal: Negative for nausea, vomiting, diarrhea and constipation.  Genitourinary: Negative for dysuria and hematuria.  Musculoskeletal: Negative for gait problem.  Skin: Positive for color change. Negative for wound.  Neurological: Negative for dizziness, syncope, weakness, numbness and headaches.    Allergies  Dilaudid; Percocet; Amoxicillin; Codeine; and Nickel  Home Medications   Prior to Admission medications   Medication Sig Start Date End Date Taking? Authorizing Provider  cephALEXin (KEFLEX) 500 MG capsule Take 1 capsule (500 mg total) by mouth 4 (four) times daily. 06/11/15   Melony Overly, MD  ibuprofen (ADVIL,MOTRIN) 200 MG tablet Take 400-600 mg by mouth every 6 (six) hours as needed for headache or mild pain.    Historical Provider, MD  OVER THE COUNTER MEDICATION Take 1 tablet by mouth at bedtime. Jeunesse PM-- (helps get a good nights sleep)    Historical Provider, MD  OVER THE COUNTER MEDICATION Take 1 tablet by mouth every morning. Jeunesse AM-- (helps get the day started/focus)    Historical Provider, MD  OVER THE COUNTER MEDICATION Take 1 each by mouth every morning. Onguard oil (doterra brand) --puts a drop on tongue    Historical Provider, MD  OVER THE COUNTER MEDICATION Take 1 drop by mouth every morning. Frankinsense (doterra Brand) --one drop on tongue.    Historical Provider, MD  OVER THE COUNTER MEDICATION  Apply 1 application topically at bedtime. Margirine, lavender and peppermint. (doterra brand)--Rubs on legs at night.    Historical Provider, MD  OVER THE COUNTER MEDICATION Apply 1 application topically at bedtime. Deep blue (doterra brand)-- rubs on lower back    Historical Provider, MD  peppermint oil liquid Take 1 drop by mouth at bedtime. Doterra Brand. --One drop on tongue.    Historical Provider, MD  polyvinyl alcohol  (LIQUIFILM TEARS) 1.4 % ophthalmic solution Place 1 drop into both eyes every morning.    Historical Provider, MD  sodium chloride (OCEAN) 0.65 % SOLN nasal spray Place 2 sprays into both nostrils daily as needed for congestion.    Historical Provider, MD   Meds Ordered and Administered this Visit  Medications - No data to display  BP 121/82 mmHg  Pulse 62  Temp(Src) 97.7 F (36.5 C) (Oral)  Resp 18  SpO2 98% No data found.   Physical Exam  Constitutional: She is oriented to person, place, and time. She appears well-developed and well-nourished. No distress.  HENT:  Head: Normocephalic and atraumatic.  Eyes: Conjunctivae are normal. Pupils are equal, round, and reactive to light.  Neck: Neck supple.  Cardiovascular: Normal rate, regular rhythm, normal heart sounds and intact distal pulses.   Pulmonary/Chest: Effort normal and breath sounds normal.  Musculoskeletal: Normal range of motion. She exhibits edema (non-pitting up to bilateral ankles).  Full 5/5 strength in bilateral lower extremities. Negative Homan's sign.  Lymphadenopathy:    She has no cervical adenopathy.  Neurological: She is alert and oriented to person, place, and time. She has normal strength. No sensory deficit.  Skin: She is not diaphoretic.  Calf circumference equal bilaterally at 53 cm. Multiple varicose veins noted to bilateral lower legs. ~3 cm area of induration noted to left lower leg below the medial head of the gastrocnemius. Mildly erythematous and without laceration or drainage. Skin distal to indurated area is mildly erythematous  Psychiatric: She has a normal mood and affect. Her behavior is normal. Judgment and thought content normal.  Nursing note and vitals reviewed.   ED Course  Procedures (including critical care time)  Labs Review Labs Reviewed - No data to display  Imaging Review No results found.    MDM   1. Cellulitis of left leg    59 yo female with history of varicose veins  with left leg swelling and pain. Vitals stable in urgent care. Calf circumference equal bilaterally. Wells Score 0. Homan's sign negative. ~3cm area of induration with erythema below left medial head of gastrocnemius muscle. Presentation consistent with cellulitis. Rx Keflex 500 mg four times daily x 7 days. Patient to elevate leg as much as possible and take Tylenol/Ibuprofen for pain relief. Patient to follow up with this facility if symptoms do not improve in a couple days. Patient voices no other complaints at this time.   I have seen and examined the patient with the student. I reviewed and edited the note as necessary.  Melony Overly, MD 06/11/15 2025

## 2016-04-06 ENCOUNTER — Encounter (HOSPITAL_COMMUNITY): Payer: Self-pay | Admitting: *Deleted

## 2016-04-06 ENCOUNTER — Ambulatory Visit (HOSPITAL_COMMUNITY)
Admission: EM | Admit: 2016-04-06 | Discharge: 2016-04-06 | Disposition: A | Discharge status: Home / Self Care | Payer: BLUE CROSS/BLUE SHIELD | Attending: Family Medicine | Admitting: Family Medicine

## 2016-04-06 DIAGNOSIS — J069 Acute upper respiratory infection, unspecified: Secondary | ICD-10-CM

## 2016-04-06 MED ORDER — IPRATROPIUM BROMIDE 0.06 % NA SOLN: 2.0000 | Freq: Four times a day (QID) | NASAL | 1 refills | Status: DC

## 2016-04-06 NOTE — ED Provider Notes (Signed)
[facility]    CSN: [id number] Arrival date & time: 04/06/16  1219  First Provider Contact:  First MD Initiated Contact with Patient 04/06/16 1347        History   Chief Complaint Chief Complaint  Patient presents with  . URI    HPI Chelsea Bell is a 60 y.o. female.    URI  Presenting symptoms: congestion, cough, rhinorrhea and sore throat   Presenting symptoms: no fever   Severity:  Mild Duration:  3 days Progression:  Unchanged Chronicity:  New Relieved by:  None tried Worsened by:  Nothing Ineffective treatments:  None tried Associated symptoms: sneezing   Associated symptoms: no wheezing     Past Medical History:  Diagnosis Date  . Cancer [facility])    endometrial cancer- surgery only  . GERD (gastroesophageal reflux disease)     There are no active problems to display for this patient.   Past Surgical History:  Procedure Laterality Date  . ABDOMINAL HYSTERECTOMY    . COLONOSCOPY WITH PROPOFOL N/A 06/09/2014   Procedure: COLONOSCOPY WITH PROPOFOL;  Surgeon: Cleotis Nipper, MD;  Location: [facility];  Service: Endoscopy;  Laterality: N/A;  . DILATION AND CURETTAGE OF UTERUS    . LAPAROSCOPIC OVARIAN     diagnostic with 1 ovary removed , then Hysterectomy(endometrial cancer) with other ovary removed  . TONSILLECTOMY      OB History    No data available       Home Medications    Prior to Admission medications   Medication Sig Start Date End Date Taking? Authorizing Provider  cephALEXin (KEFLEX) 500 MG capsule Take 1 capsule (500 mg total) by mouth 4 (four) times daily. 06/11/15   Melony Overly, MD  ibuprofen (ADVIL,MOTRIN) 200 MG tablet Take 400-600 mg by mouth every 6 (six) hours as needed for headache or mild pain.    Historical Provider, MD  OVER THE COUNTER MEDICATION Take 1 tablet by mouth at bedtime. Jeunesse PM-- (helps get a good nights sleep)    Historical Provider, MD  OVER THE COUNTER MEDICATION Take 1 tablet by mouth  every morning. Jeunesse AM-- (helps get the day started/focus)    Historical Provider, MD  OVER THE COUNTER MEDICATION Take 1 each by mouth every morning. Onguard oil (doterra brand) --puts a drop on tongue    Historical Provider, MD  OVER THE COUNTER MEDICATION Take 1 drop by mouth every morning. Frankinsense (doterra Brand) --one drop on tongue.    Historical Provider, MD  OVER THE COUNTER MEDICATION Apply 1 application topically at bedtime. Margirine, lavender and peppermint. (doterra brand)--Rubs on legs at night.    Historical Provider, MD  OVER THE COUNTER MEDICATION Apply 1 application topically at bedtime. Deep blue (doterra brand)-- rubs on lower back    Historical Provider, MD  peppermint oil liquid Take 1 drop by mouth at bedtime. Doterra Brand. --One drop on tongue.    Historical Provider, MD  polyvinyl alcohol (LIQUIFILM TEARS) 1.4 % ophthalmic solution Place 1 drop into both eyes every morning.    Historical Provider, MD  sodium chloride (OCEAN) 0.65 % SOLN nasal spray Place 2 sprays into both nostrils daily as needed for congestion.    Historical Provider, MD    Family History History reviewed. No pertinent family history.  Social History Social History  Substance Use Topics  . Smoking status: Never Smoker  . Smokeless tobacco: Not on file  . Alcohol use No     Allergies  Dilaudid [hydromorphone hcl]; Percocet [oxycodone-acetaminophen]; Amoxicillin; Codeine; and Nickel   Review of Systems Review of Systems  Constitutional: Negative.  Negative for fever.  HENT: Positive for congestion, postnasal drip, rhinorrhea, sneezing and sore throat.   Respiratory: Positive for cough. Negative for shortness of breath and wheezing.   Cardiovascular: Negative.   Gastrointestinal: Negative.   Musculoskeletal: Negative.   All other systems reviewed and are negative.    Physical Exam Triage Vital Signs ED Triage Vitals  Enc Vitals Group     BP 04/06/16 1333 136/82     Pulse  Rate 04/06/16 1333 63     Resp 04/06/16 1333 16     Temp 04/06/16 1333 98.1 F (36.7 C)     Temp Source 04/06/16 1333 Oral     SpO2 04/06/16 1333 100 %     Weight --      Height --      Head Circumference --      Peak Flow --      Pain Score 04/06/16 1336 7     Pain Loc --      Pain Edu? --      Excl. in [place]? --    No data found.   Updated Vital Signs BP 136/82 (BP Location: Left Arm)   Pulse 63   Temp 98.1 F (36.7 C) (Oral)   Resp 16   SpO2 100%   Visual Acuity Right Eye Distance:   Left Eye Distance:   Bilateral Distance:    Right Eye Near:   Left Eye Near:    Bilateral Near:     Physical Exam  Constitutional: She appears well-developed and well-nourished.  HENT:  Right Ear: External ear normal.  Left Ear: External ear normal.  Mouth/Throat: Oropharynx is clear and moist.  Eyes: Pupils are equal, round, and reactive to light.  Neck: Normal range of motion. Neck supple.  Cardiovascular: Normal rate, regular rhythm, normal heart sounds and intact distal pulses.   Pulmonary/Chest: Effort normal and breath sounds normal.  Lymphadenopathy:    She has no cervical adenopathy.  Skin: Skin is warm and dry.  Nursing note and vitals reviewed.    UC Treatments / Results  Labs (all labs ordered are listed, but only abnormal results are displayed) Labs Reviewed - No data to display  EKG  EKG Interpretation None       Radiology No results found.  Procedures Procedures (including critical care time)  Medications Ordered in UC Medications - No data to display   Initial Impression / Assessment and Plan / UC Course  I have reviewed the triage vital signs and the nursing notes.  Pertinent labs & imaging results that were available during my care of the patient were reviewed by me and considered in my medical decision making (see chart for details).  Clinical Course      Final Clinical Impressions(s) / UC Diagnoses   Final diagnoses:  None    New  Prescriptions New Prescriptions   No medications on file     Billy Fischer, MD 04/06/16 1352

## 2016-04-06 NOTE — ED Triage Notes (Signed)
Pt  reportsb symptoms  Of  Sinus  Drainage  /  Congestion   X  Several  Days      Pt  Reports  The   Congestion  Has  Settled   In her  Chest   She reports symptoms  Not  releived  By  Home  remedys

## 2020-07-21 ENCOUNTER — Ambulatory Visit
Admission: EM | Admit: 2020-07-21 | Discharge: 2020-07-21 | Disposition: A | Discharge status: Home / Self Care | Payer: 59 | Attending: Family Medicine | Admitting: Family Medicine

## 2020-07-21 DIAGNOSIS — Z1152 Encounter for screening for COVID-19: Secondary | ICD-10-CM

## 2020-07-21 DIAGNOSIS — R059 Cough, unspecified: Secondary | ICD-10-CM

## 2020-07-21 MED ORDER — ALBUTEROL SULFATE HFA 108 (90 BASE) MCG/ACT IN AERS: 1.0000 | INHALATION_SPRAY | Freq: Four times a day (QID) | RESPIRATORY_TRACT | 0 refills | Status: DC | PRN

## 2020-07-21 MED ORDER — BENZONATATE 100 MG PO CAPS: 100.0000 mg | ORAL_CAPSULE | Freq: Three times a day (TID) | ORAL | 0 refills | Status: DC

## 2020-07-21 NOTE — Discharge Instructions (Signed)
Covid test pending.  You can use the albuterol inhaler as needed for cough, wheezing or shortness of breath every 4-6 hours.  Tessalon Perles as needed for cough.

## 2020-07-21 NOTE — ED Triage Notes (Signed)
Pt presents with cough for past few days, believes to be bronchitis , has had positive covid exposure

## 2020-07-24 NOTE — ED Provider Notes (Signed)
[facility]    CSN: [id number] Arrival date & time: 07/21/20  1430      History   Chief Complaint Chief Complaint  Patient presents with  . Cough    HPI Chelsea Bell is a 65 y.o. female.   Patient is a 65 year old female presents today for cough for the past few days.  Concern for bronchitis.  Did have positive Covid exposure.  No fevers, chills, chest pain or shortness of breath.     Past Medical History:  Diagnosis Date  . Cancer [facility])    endometrial cancer- surgery only  . GERD (gastroesophageal reflux disease)     There are no problems to display for this patient.   Past Surgical History:  Procedure Laterality Date  . ABDOMINAL HYSTERECTOMY    . COLONOSCOPY WITH PROPOFOL N/A 06/09/2014   Procedure: COLONOSCOPY WITH PROPOFOL;  Surgeon: Cleotis Nipper, MD;  Location: [facility];  Service: Endoscopy;  Laterality: N/A;  . DILATION AND CURETTAGE OF UTERUS    . LAPAROSCOPIC OVARIAN     diagnostic with 1 ovary removed , then Hysterectomy(endometrial cancer) with other ovary removed  . TONSILLECTOMY      OB History   No obstetric history on file.      Home Medications    Prior to Admission medications   Medication Sig Start Date End Date Taking? Authorizing Provider  albuterol (VENTOLIN HFA) 108 (90 Base) MCG/ACT inhaler Inhale 1-2 puffs into the lungs every 6 (six) hours as needed for wheezing or shortness of breath. 07/21/20  Yes Kenita Bines A, NP  benzonatate (TESSALON) 100 MG capsule Take 1 capsule (100 mg total) by mouth every 8 (eight) hours. 07/21/20  Yes Sameul Tagle A, NP  ibuprofen (ADVIL,MOTRIN) 200 MG tablet Take 400-600 mg by mouth every 6 (six) hours as needed for headache or mild pain.    [provider]  ipratropium (ATROVENT) 0.06 % nasal spray Place 2 sprays into both nostrils 4 (four) times daily. 04/06/16   Billy Fischer, MD  OVER THE COUNTER MEDICATION Take 1 tablet by mouth at bedtime. [place] PM-- (helps get  a good nights sleep)    [provider]  OVER THE COUNTER MEDICATION Take 1 tablet by mouth every morning. Jeunesse AM-- (helps get the day started/focus)    [provider]  OVER THE COUNTER MEDICATION Take 1 each by mouth every morning. Onguard oil (doterra brand) --puts a drop on tongue    [provider]  OVER THE COUNTER MEDICATION Take 1 drop by mouth every morning. Frankinsense (doterra Brand) --one drop on tongue.    [provider]  OVER THE COUNTER MEDICATION Apply 1 application topically at bedtime. Margirine, lavender and peppermint. (doterra brand)--Rubs on legs at night.    [provider]  OVER THE COUNTER MEDICATION Apply 1 application topically at bedtime. Deep blue (doterra brand)-- rubs on lower back    [provider]  peppermint oil liquid Take 1 drop by mouth at bedtime. Doterra Brand. --One drop on tongue.    [provider]  polyvinyl alcohol (LIQUIFILM TEARS) 1.4 % ophthalmic solution Place 1 drop into both eyes every morning.    [provider]  sodium chloride (OCEAN) 0.65 % SOLN nasal spray Place 2 sprays into both nostrils daily as needed for congestion.    [provider]    Family History No family history on file.  Social History Social History   Tobacco Use  . Smoking status:  Never Smoker  Substance Use Topics  . Alcohol use: No  . Drug use: No     Allergies   Dilaudid [hydromorphone hcl], Percocet [oxycodone-acetaminophen], Amoxicillin, Codeine, and Nickel   Review of Systems Review of Systems   Physical Exam Triage Vital Signs ED Triage Vitals  Enc Vitals Group     BP 07/21/20 1458 (!) 146/87     Pulse Rate 07/21/20 1458 75     Resp 07/21/20 1458 15     Temp 07/21/20 1458 98.4 F (36.9 C)     Temp src --      SpO2 07/21/20 1458 98 %     Weight --      Height --      Head Circumference --      Peak Flow --      Pain Score 07/21/20 1523 0     Pain Loc --       Pain Edu? --      Excl. in [place]? --    No data found.  Updated Vital Signs BP (!) 146/87   Pulse 75   Temp 98.4 F (36.9 C)   Resp 15   SpO2 98%   Visual Acuity Right Eye Distance:   Left Eye Distance:   Bilateral Distance:    Right Eye Near:   Left Eye Near:    Bilateral Near:     Physical Exam Vitals and nursing note reviewed.  Constitutional:      General: She is not in acute distress.    Appearance: Normal appearance. She is not ill-appearing, toxic-appearing or diaphoretic.  HENT:     Head: Normocephalic.     Nose: Nose normal.     Mouth/Throat:     Pharynx: Oropharynx is clear.  Eyes:     Conjunctiva/sclera: Conjunctivae normal.  Cardiovascular:     Rate and Rhythm: Normal rate and regular rhythm.  Pulmonary:     Effort: Pulmonary effort is normal.     Breath sounds: Normal breath sounds.  Musculoskeletal:        General: Normal range of motion.     Cervical back: Normal range of motion.  Skin:    General: Skin is warm and dry.     Findings: No rash.  Neurological:     Mental Status: She is alert.  Psychiatric:        Mood and Affect: Mood normal.      UC Treatments / Results  Labs (all labs ordered are listed, but only abnormal results are displayed) Labs Reviewed  NOVEL CORONAVIRUS, NAA    EKG   Radiology No results found.  Procedures Procedures (including critical care time)  Medications Ordered in UC Medications - No data to display  Initial Impression / Assessment and Plan / UC Course  I have reviewed the triage vital signs and the nursing notes.  Pertinent labs & imaging results that were available during my care of the patient were reviewed by me and considered in my medical decision making (see chart for details).     Cough Most likely viral. Tessalon Perles for cough as needed. Albuterol as needed for cough, wheezing or shortness of breath. Follow up as needed for continued or worsening symptoms  Final Clinical  Impressions(s) / UC Diagnoses   Final diagnoses:  Cough     Discharge Instructions     Covid test pending.  You can use the albuterol inhaler as needed for cough, wheezing or shortness of breath every 4-6 hours.  Tessalon Perles as needed for cough.   ED Prescriptions    Medication Sig Dispense Auth. Provider   albuterol (VENTOLIN HFA) 108 (90 Base) MCG/ACT inhaler Inhale 1-2 puffs into the lungs every 6 (six) hours as needed for wheezing or shortness of breath. 1 each Dashana Guizar A, NP   benzonatate (TESSALON) 100 MG capsule Take 1 capsule (100 mg total) by mouth every 8 (eight) hours. 21 capsule Mehtab Dolberry A, NP     PDMP not reviewed this encounter.   Dahlia Byes A, NP 07/24/20 1604

## 2020-07-25 LAB — NOVEL CORONAVIRUS, NAA: SARS-CoV-2, NAA: DETECTED — AB

## 2020-07-30 ENCOUNTER — Encounter: Payer: Self-pay | Admitting: Emergency Medicine

## 2020-07-30 ENCOUNTER — Ambulatory Visit (INDEPENDENT_AMBULATORY_CARE_PROVIDER_SITE_OTHER): Payer: 59

## 2020-07-30 ENCOUNTER — Ambulatory Visit
Admission: EM | Admit: 2020-07-30 | Discharge: 2020-07-30 | Disposition: A | Discharge status: Home / Self Care | Payer: 59 | Attending: Family Medicine | Admitting: Family Medicine

## 2020-07-30 ENCOUNTER — Other Ambulatory Visit: Payer: Self-pay

## 2020-07-30 DIAGNOSIS — R519 Headache, unspecified: Secondary | ICD-10-CM | POA: Diagnosis not present

## 2020-07-30 DIAGNOSIS — J1282 Pneumonia due to coronavirus disease 2019: Secondary | ICD-10-CM

## 2020-07-30 DIAGNOSIS — U071 COVID-19: Secondary | ICD-10-CM | POA: Diagnosis not present

## 2020-07-30 DIAGNOSIS — R059 Cough, unspecified: Secondary | ICD-10-CM

## 2020-07-30 DIAGNOSIS — R0602 Shortness of breath: Secondary | ICD-10-CM | POA: Diagnosis not present

## 2020-07-30 MED ORDER — PROMETHAZINE-DM 6.25-15 MG/5ML PO SYRP
5.0000 mL | ORAL_SOLUTION | Freq: Four times a day (QID) | ORAL | 0 refills | Status: DC | PRN
Start: 2020-07-30 — End: 2021-10-07

## 2020-07-30 MED ORDER — METHYLPREDNISOLONE SODIUM SUCC 125 MG IJ SOLR
125.0000 mg | Freq: Once | INTRAMUSCULAR | Status: AC
  Administered 2020-07-30: 125 mg via INTRAMUSCULAR

## 2020-07-30 MED ORDER — PREDNISONE 10 MG (21) PO TBPK: ORAL_TABLET | Freq: Every day | ORAL | 0 refills | Status: AC

## 2020-07-30 MED ORDER — AZITHROMYCIN 250 MG PO TABS: 250.0000 mg | ORAL_TABLET | Freq: Every day | ORAL | 0 refills | Status: DC

## 2020-07-30 NOTE — Discharge Instructions (Addendum)
Your xray shows viral pneumonia consistent with Covid 19 pneumonia  I have sent in a prednisone taper for you to take for 6 days. 6 tablets on day one, 5 tablets on day two, 4 tablets on day three, 3 tablets on day four, 2 tablets on day five, and 1 tablet on day six.  I have sent in azithromycin for you to take. Take 2 tablets today, then one tablet daily for the next 4 days.  I have sent in cough syrup for you to take as needed for cough. This medication can make you sleepy. Do not drive or operate heavy machinery while taking this medication.  Follow up with this office or with primary care if symptoms are persisting.  Follow up in the ER for high fever, trouble swallowing, trouble breathing, other concerning symptoms.

## 2020-07-30 NOTE — ED Triage Notes (Signed)
Cough since new Year's eve.  Covid positive.  Cough medication is not helping.

## 2020-08-04 NOTE — ED Provider Notes (Signed)
Kurt Azimi   [id number] 07/30/20 Arrival Time: 1440   CC: COVID symptoms  SUBJECTIVE: History from: patient.  Chelsea Bell is a 65 y.o. female who presents with cough, fatigue, SOB, body aches, headache since 07/21/20. Was diagnosed with Covid 19 the same day. Has medical hx of obesity. Was seen in urgent care for cough 07/21/20, and treated with albuterol and benzonatate. States that this has not been helping her cough. Has not completed Covid vaccines. Has not taken OTC medications for this. Cough, SOB, fatigue and body aches are worsened with activity. Denies previous symptoms in the past. Denies fever, sinus pain, rhinorrhea, sore throat, nausea, changes in bowel or bladder habits.    ROS: As per HPI.  All other pertinent ROS negative.     Past Medical History:  Diagnosis Date  . Cancer [facility])    endometrial cancer- surgery only  . GERD (gastroesophageal reflux disease)    Past Surgical History:  Procedure Laterality Date  . ABDOMINAL HYSTERECTOMY    . COLONOSCOPY WITH PROPOFOL N/A 06/09/2014   Procedure: COLONOSCOPY WITH PROPOFOL;  Surgeon: Cleotis Nipper, MD;  Location: [facility] ENDOSCOPY;  Service: Endoscopy;  Laterality: N/A;  . DILATION AND CURETTAGE OF UTERUS    . LAPAROSCOPIC OVARIAN     diagnostic with 1 ovary removed , then Hysterectomy(endometrial cancer) with other ovary removed  . TONSILLECTOMY     Allergies  Allergen Reactions  . Dilaudid [Hydromorphone Hcl] Itching  . Percocet [Oxycodone-Acetaminophen] Itching  . Amoxicillin Itching    + headache.   . Codeine Itching    + Headache.   . Nickel Dermatitis    "itching, skin blisters"   No current facility-administered medications on file prior to encounter.   Current Outpatient Medications on File Prior to Encounter  Medication Sig Dispense Refill  . albuterol (VENTOLIN HFA) 108 (90 Base) MCG/ACT inhaler Inhale 1-2 puffs into the lungs every 6 (six) hours as needed for wheezing or shortness of  breath. 1 each 0  . benzonatate (TESSALON) 100 MG capsule Take 1 capsule (100 mg total) by mouth every 8 (eight) hours. 21 capsule 0  . ibuprofen (ADVIL,MOTRIN) 200 MG tablet Take 400-600 mg by mouth every 6 (six) hours as needed for headache or mild pain.    Marland Kitchen ipratropium (ATROVENT) 0.06 % nasal spray Place 2 sprays into both nostrils 4 (four) times daily. 15 mL 1  . OVER THE COUNTER MEDICATION Take 1 tablet by mouth at bedtime. Gibson PM-- (helps get a good nights sleep)    . OVER THE COUNTER MEDICATION Take 1 tablet by mouth every morning. Jeunesse AM-- (helps get the day started/focus)    . OVER THE COUNTER MEDICATION Take 1 each by mouth every morning. Onguard oil (doterra brand) --puts a drop on tongue    . OVER THE COUNTER MEDICATION Take 1 drop by mouth every morning. Frankinsense (doterra Brand) --one drop on tongue.    Marland Kitchen OVER THE COUNTER MEDICATION Apply 1 application topically at bedtime. Margirine, lavender and peppermint. (doterra brand)--Rubs on legs at night.    Marland Kitchen OVER THE COUNTER MEDICATION Apply 1 application topically at bedtime. Deep blue (doterra brand)-- rubs on lower back    . peppermint oil liquid Take 1 drop by mouth at bedtime. Doterra Brand. --One drop on tongue.    . polyvinyl alcohol (LIQUIFILM TEARS) 1.4 % ophthalmic solution Place 1 drop into both eyes every morning.    . sodium chloride (OCEAN) 0.65 % SOLN nasal spray Place 2  sprays into both nostrils daily as needed for congestion.     Social History   Socioeconomic History  . Marital status: Married    Spouse name: Not on file  . Number of children: Not on file  . Years of education: Not on file  . Highest education level: Not on file  Occupational History  . Not on file  Tobacco Use  . Smoking status: Never Smoker  . Smokeless tobacco: Never Used  Substance and Sexual Activity  . Alcohol use: No  . Drug use: No  . Sexual activity: Yes  Other Topics Concern  . Not on file  Social History Narrative   . Not on file   Social Determinants of Health   Financial Resource Strain: Not on file  Food Insecurity: Not on file  Transportation Needs: Not on file  Physical Activity: Not on file  Stress: Not on file  Social Connections: Not on file  Intimate Partner Violence: Not on file   History reviewed. No pertinent family history.  OBJECTIVE:  Vitals:   07/30/20 1627 07/30/20 1628  BP: (!) 144/93   Pulse: 99   Resp: 20   Temp: 99.7 F (37.6 C)   TempSrc: Oral   SpO2: 95%   Weight:  300 lb (136.1 kg)  Height:  5\' 7"  (1.702 m)     General appearance: alert; appears fatigued, but nontoxic; speaking in full sentences and tolerating own secretions HEENT: NCAT; Ears: EACs clear, TMs pearly gray; Eyes: PERRL.  EOM grossly intact. Sinuses: nontender; Nose: nares patent with clear rhinorrhea, Throat: oropharynx erythematous, cobblestoning present, tonsils non erythematous or enlarged, uvula midline  Neck: supple without LAD Lungs: unlabored respirations, symmetrical air entry; cough: moderate; no respiratory distress; coarse lung sounds to bilateral lower lobes, mild wheezing to upper lobes Heart: regular rate and rhythm.  Radial pulses 2+ symmetrical bilaterally Skin: warm and dry Psychological: alert and cooperative; normal mood and affect  LABS:  No results found for this or any previous visit (from the past 24 hour(s)).   ASSESSMENT & PLAN:  1. Pneumonia due to COVID-19 virus   2. SOB (shortness of breath)   3. Cough   4. COVID-19   5. Nonintractable headache, unspecified chronicity pattern, unspecified headache type     Meds ordered this encounter  Medications  . promethazine-dextromethorphan (PROMETHAZINE-DM) 6.25-15 MG/5ML syrup    Sig: Take 5 mLs by mouth 4 (four) times daily as needed for cough.    Dispense:  118 mL    Refill:  0    Order Specific Question:   Supervising Provider    Answer:   Merrilee JanskyLAMPTEY, PHILIP O [id number]]  . predniSONE (STERAPRED UNI-PAK 21 TAB) 10  MG (21) TBPK tablet    Sig: Take by mouth daily for 6 days. Take 6 tablets on day 1, 5 tablets on day 2, 4 tablets on day 3, 3 tablets on day 4, 2 tablets on day 5, 1 tablet on day 6    Dispense:  21 tablet    Refill:  0    Order Specific Question:   Supervising Provider    Answer:   Merrilee JanskyLAMPTEY, PHILIP O [id number]]  . methylPREDNISolone sodium succinate (SOLU-MEDROL) 125 mg/2 mL injection 125 mg  . azithromycin (ZITHROMAX) 250 MG tablet    Sig: Take 1 tablet (250 mg total) by mouth daily. Take first 2 tablets together, then 1 every day until finished.    Dispense:  6 tablet    Refill:  0  Order Specific Question:   Supervising Provider    Answer:   Chase Picket [[id number]]   Chest xray shows "subtle developing opacities in the periphery of the left lung" Prescribed steroid taper Promethazine cough syrup prescribed Sedation precautions given Prescribed azithromycin Solumedrol 125mg  IM in office today Continue supportive care at home COVID and flu testing ordered.  It will take between 2-3 days for test results. Someone will contact you regarding abnormal results.   Patient should remain in quarantine until they have received Covid results.  If negative you may resume normal activities (go back to work/school) while practicing hand hygiene, social distance, and mask wearing.  If positive, patient should remain in quarantine for at least 5 days from symptom onset AND greater than 72 hours after symptoms resolution (absence of fever without the use of fever-reducing medication and improvement in respiratory symptoms), whichever is longer Get plenty of rest and push fluids Use OTC zyrtec for nasal congestion, runny nose, and/or sore throat Use OTC flonase for nasal congestion and runny nose Use medications daily for symptom relief Use OTC medications like ibuprofen or tylenol as needed fever or pain Call or go to the ED if you have any new or worsening symptoms such as fever, worsening cough,  shortness of breath, chest tightness, chest pain, turning blue, changes in mental status.  Reviewed expectations re: course of current medical issues. Questions answered. Outlined signs and symptoms indicating need for more acute intervention. Patient verbalized understanding. After Visit Summary given.         Faustino Congress, NP 08/04/20 1105

## 2021-07-30 ENCOUNTER — Other Ambulatory Visit: Payer: Self-pay

## 2021-10-06 ENCOUNTER — Emergency Department (HOSPITAL_COMMUNITY): Payer: Medicare Other

## 2021-10-06 ENCOUNTER — Observation Stay (HOSPITAL_COMMUNITY)
Admission: EM | Admit: 2021-10-06 | Discharge: 2021-10-07 | Disposition: A | Discharge status: Home / Self Care | Payer: Medicare Other | Attending: Internal Medicine | Admitting: Internal Medicine

## 2021-10-06 ENCOUNTER — Encounter (HOSPITAL_COMMUNITY): Payer: Self-pay | Admitting: Emergency Medicine

## 2021-10-06 DIAGNOSIS — R079 Chest pain, unspecified: Secondary | ICD-10-CM | POA: Diagnosis present

## 2021-10-06 DIAGNOSIS — R519 Headache, unspecified: Secondary | ICD-10-CM | POA: Insufficient documentation

## 2021-10-06 DIAGNOSIS — Z79899 Other long term (current) drug therapy: Secondary | ICD-10-CM | POA: Diagnosis not present

## 2021-10-06 DIAGNOSIS — R0789 Other chest pain: Secondary | ICD-10-CM | POA: Diagnosis not present

## 2021-10-06 DIAGNOSIS — Z8542 Personal history of malignant neoplasm of other parts of uterus: Secondary | ICD-10-CM | POA: Insufficient documentation

## 2021-10-06 DIAGNOSIS — I10 Essential (primary) hypertension: Secondary | ICD-10-CM | POA: Insufficient documentation

## 2021-10-06 DIAGNOSIS — J449 Chronic obstructive pulmonary disease, unspecified: Secondary | ICD-10-CM | POA: Diagnosis not present

## 2021-10-06 DIAGNOSIS — R42 Dizziness and giddiness: Secondary | ICD-10-CM

## 2021-10-06 LAB — COMPREHENSIVE METABOLIC PANEL
ALT: 23 U/L (ref 0–44)
AST: 20 U/L (ref 15–41)
Albumin: 4 g/dL (ref 3.5–5.0)
Alkaline Phosphatase: 97 U/L (ref 38–126)
Anion gap: 8 (ref 5–15)
BUN: 27 mg/dL — ABNORMAL HIGH (ref 8–23)
CO2: 27 mmol/L (ref 22–32)
Calcium: 9.1 mg/dL (ref 8.9–10.3)
Chloride: 105 mmol/L (ref 98–111)
Creatinine, Ser: 1.03 mg/dL — ABNORMAL HIGH (ref 0.44–1.00)
GFR, Estimated: >60 mL/min (ref >60)
Glucose, Bld: 134 mg/dL — ABNORMAL HIGH (ref 70–99)
Potassium: 4 mmol/L (ref 3.5–5.1)
Sodium: 140 mmol/L (ref 135–145)
Total Bilirubin: 0.7 mg/dL (ref 0.3–1.2)
Total Protein: 7 g/dL (ref 6.5–8.1)

## 2021-10-06 LAB — CBC WITH DIFFERENTIAL/PLATELET
Abs Immature Granulocytes: 0.02 10*3/uL (ref 0.00–0.07)
Basophils Absolute: 0.1 10*3/uL (ref 0.0–0.1)
Basophils Relative: 1 %
Eosinophils Absolute: 0.1 10*3/uL (ref 0.0–0.5)
Eosinophils Relative: 1 %
HCT: 40.9 % (ref 36.0–46.0)
Hemoglobin: 12.6 g/dL (ref 12.0–15.0)
Immature Granulocytes: 0 %
Lymphocytes Relative: 36 %
Lymphs Abs: 2.9 10*3/uL (ref 0.7–4.0)
MCH: 28.8 pg (ref 26.0–34.0)
MCHC: 30.8 g/dL (ref 30.0–36.0)
MCV: 93.4 fL (ref 80.0–100.0)
Monocytes Absolute: 0.4 10*3/uL (ref 0.1–1.0)
Monocytes Relative: 5 %
Neutro Abs: 4.5 10*3/uL (ref 1.7–7.7)
Neutrophils Relative %: 57 %
Platelets: 229 10*3/uL (ref 150–400)
RBC: 4.38 MIL/uL (ref 3.87–5.11)
RDW: 14.6 % (ref 11.5–15.5)
WBC: 8 10*3/uL (ref 4.0–10.5)
nRBC: 0 % (ref 0.0–0.2)

## 2021-10-06 LAB — D-DIMER, QUANTITATIVE: D-Dimer, Quant: 0.44 ug/mL-FEU (ref 0.00–0.50)

## 2021-10-06 NOTE — ED Provider Notes (Signed)
?[facility] ?Provider Note ? ? ?CSN: [id number] ?Arrival date & time: 10/06/21  2123 ? ?  ? ?History ? ?Chief Complaint  ?Patient presents with  ? Hypertension  ? ? ?Chelsea Bell is a 66 y.o. female without significant cardiac history or prior CVA presenting today with episodic dizziness, right-sided head pain, and chest pressure around 2100 today.  Episodic dizziness began while sitting in chair, patient stood up and walked to kitchen.  Then felt the room spinning, extremities were tingling, and then felt pressure on her chest.  Sensations lasted 10-20 seconds.  Called 911.  Denies this ever happening before.  Denies SOB.  EMS arrived and observed her have several more "episodes of dizziness".  During episodes, became hypertensive and eyes rolled back.  No grogginess following episodes.  Pt began having accompanying head pains described as transient and non-headache like.  Denies vision changes, facial asymmetry, focal deficits.  Denies recent trauma. ? ?Hx of cancer (20 yrs ago - endometrial stage 1) and GERD.  Lives alone. ? ?The history is provided by the patient and medical records.  ?Hypertension ? ? ?  ? ?Home Medications ?Prior to Admission medications   ?Medication Sig Start Date End Date Taking? Authorizing Provider  ?albuterol (VENTOLIN HFA) 108 (90 Base) MCG/ACT inhaler Inhale 1-2 puffs into the lungs every 6 (six) hours as needed for wheezing or shortness of breath. 07/21/20   Loura Halt A, NP  ?azithromycin (ZITHROMAX) 250 MG tablet Take 1 tablet (250 mg total) by mouth daily. Take first 2 tablets together, then 1 every day until finished. 07/30/20   Faustino Congress, NP  ?benzonatate (TESSALON) 100 MG capsule Take 1 capsule (100 mg total) by mouth every 8 (eight) hours. 07/21/20   Loura Halt A, NP  ?ibuprofen (ADVIL,MOTRIN) 200 MG tablet Take 400-600 mg by mouth every 6 (six) hours as needed for headache or mild pain.    [provider]  ?ipratropium (ATROVENT)  0.06 % nasal spray Place 2 sprays into both nostrils 4 (four) times daily. 04/06/16   Billy Fischer, MD  ?OVER THE COUNTER MEDICATION Take 1 tablet by mouth at bedtime. [place] PM-- (helps get a good nights sleep)    [provider]  ?OVER THE COUNTER MEDICATION Take 1 tablet by mouth every morning. Jeunesse AM-- (helps get the day started/focus)    [provider]  ?OVER THE COUNTER MEDICATION Take 1 each by mouth every morning. Onguard oil (doterra brand) --puts a drop on tongue    [provider]  ?OVER THE COUNTER MEDICATION Take 1 drop by mouth every morning. Frankinsense (doterra Brand) --one drop on tongue.    [provider]  ?OVER THE COUNTER MEDICATION Apply 1 application topically at bedtime. Margirine, lavender and peppermint. (doterra brand)--Rubs on legs at night.    [provider]  ?OVER THE COUNTER MEDICATION Apply 1 application topically at bedtime. Deep blue (doterra brand)-- rubs on lower back    [provider]  ?peppermint oil liquid Take 1 drop by mouth at bedtime. Doterra Brand. --One drop on tongue.    [provider]  ?polyvinyl alcohol (LIQUIFILM TEARS) 1.4 % ophthalmic solution Place 1 drop into both eyes every morning.    [provider]  ?promethazine-dextromethorphan (PROMETHAZINE-DM) 6.25-15 MG/5ML syrup Take 5 mLs by mouth 4 (four) times daily as needed for cough. 07/30/20   Faustino Congress, NP  ?sodium chloride (OCEAN) 0.65 % SOLN nasal spray Place 2 sprays into both nostrils daily  as needed for congestion.    [provider]  ?   ? ?Allergies    ?Dilaudid [hydromorphone hcl], Percocet [oxycodone-acetaminophen], Amoxicillin, Codeine, and Nickel   ? ?Review of Systems   ?Review of Systems  ?Cardiovascular:   ?     Chest pressure  ?Neurological:  Positive for dizziness.  ?     Right sided head pain ?Tingling of extremities  ? ?Physical Exam ?Updated Vital Signs ?BP 135/67   Pulse 73   Temp 98 ?F  (36.7 ?C) (Oral)   Resp 20   SpO2 100%  ?Physical Exam ?Vitals and nursing note reviewed.  ?Constitutional:   ?   General: She is not in acute distress. ?   Appearance: She is well-developed.  ?HENT:  ?   Head: Normocephalic and atraumatic.  ?   Mouth/Throat:  ?   Mouth: Mucous membranes are moist.  ?   Tongue: Tongue does not deviate from midline.  ?   Pharynx: Uvula midline.  ?Eyes:  ?   General: Lids are normal. Vision grossly intact. Gaze aligned appropriately. No visual field deficit or scleral icterus. ?   Extraocular Movements: Extraocular movements intact.  ?   Right eye: No nystagmus.  ?   Left eye: No nystagmus.  ?   Conjunctiva/sclera: Conjunctivae normal.  ?Cardiovascular:  ?   Rate and Rhythm: Normal rate and regular rhythm.  ?   Pulses: Normal pulses.     ?     Radial pulses are 2+ on the right side and 2+ on the left side.  ?     Dorsalis pedis pulses are 2+ on the right side and 2+ on the left side.  ?   Heart sounds: No murmur heard. ?Pulmonary:  ?   Effort: Pulmonary effort is normal. No respiratory distress.  ?   Breath sounds: Normal breath sounds.  ?Chest:  ?   Chest wall: No deformity, swelling, tenderness or crepitus.  ?Abdominal:  ?   General: Bowel sounds are normal.  ?   Palpations: Abdomen is soft.  ?   Tenderness: There is no abdominal tenderness.  ?Musculoskeletal:     ?   General: No swelling.  ?   Cervical back: Neck supple.  ?   Right lower leg: 2+ Pitting Edema present.  ?   Left lower leg: 2+ Pitting Edema present.  ?Skin: ?   General: Skin is warm and dry.  ?   Capillary Refill: Capillary refill takes less than 2 seconds.  ?Neurological:  ?   Mental Status: She is alert and oriented to person, place, and time.  ?   GCS: GCS eye subscore is 4. GCS verbal subscore is 5. GCS motor subscore is 6.  ?   Cranial Nerves: Cranial nerves 2-12 are intact. No cranial nerve deficit, dysarthria or facial asymmetry.  ?   Sensory: Sensation is intact.  ?   Motor: Motor function is intact.   ?Psychiatric:     ?   Mood and Affect: Mood normal.  ? ? ?ED Results / Procedures / Treatments   ?Labs ?(all labs ordered are listed, but only abnormal results are displayed) ?Labs Reviewed  ?COMPREHENSIVE METABOLIC PANEL - Abnormal; Notable for the following components:  ?    Result Value  ? Glucose, Bld 134 (*)   ? BUN 27 (*)   ? Creatinine, Ser 1.03 (*)   ? All other components within normal limits  ?CBC WITH DIFFERENTIAL/PLATELET  ?D-DIMER, QUANTITATIVE  ?TROPONIN I (HIGH  SENSITIVITY)  ?TROPONIN I (HIGH SENSITIVITY)  ? ? ?EKG ?EKG Interpretation ? ?Date/Time:  Sunday October 07 2021 00:11:08 EDT ?Ventricular Rate:  78 ?PR Interval:  175 ?QRS Duration: 115 ?QT Interval:  396 ?QTC Calculation: 452 ?R Axis:   -31 ?Text Interpretation: Sinus rhythm Nonspecific intraventricular conduction delay Low voltage, precordial leads Confirmed by Ripley Fraise [id number]) on 10/07/2021 12:24:03 AM ? ?Radiology ?DG Chest 2 View ? ?Result Date: 10/06/2021 ?CLINICAL DATA:  Chest pressure EXAM: CHEST - 2 VIEW COMPARISON:  07/30/2020 FINDINGS: Lungs are clear.  No pleural effusion or pneumothorax. The heart is normal in size. Epicardial fat pad along the left heart border. Mild degenerative changes of the mid thoracic spine. IMPRESSION: Normal chest radiographs. Electronically Signed   By: Julian Hy M.D.   On: 10/06/2021 23:21  ? ?CT Head Wo Contrast ? ?Result Date: 10/06/2021 ?CLINICAL DATA:  Dizziness EXAM: CT HEAD WITHOUT CONTRAST TECHNIQUE: Contiguous axial images were obtained from the base of the skull through the vertex without intravenous contrast. RADIATION DOSE REDUCTION: This exam was performed according to the departmental dose-optimization program which includes automated exposure control, adjustment of the mA and/or kV according to patient size and/or use of iterative reconstruction technique. COMPARISON:  None. FINDINGS: Brain: Normal anatomic configuration. No abnormal intra or extra-axial mass lesion or fluid  collection. No abnormal mass effect or midline shift. No evidence of acute intracranial hemorrhage or infarct. Ventricular size is normal. Cerebellum unremarkable. Vascular: Unremarkable Skull: Intact Sinuses/Orbits:

## 2021-10-06 NOTE — ED Triage Notes (Signed)
Pt brought in from home c/o hypertension, dizziness and headache on the right side that comes and goes. BPs with EMS noted to funiculate 179/92, 194/98, 114/78, 177/151. Pt denies any cardiac medical hx, or hx of HTN.  ?

## 2021-10-07 ENCOUNTER — Other Ambulatory Visit (HOSPITAL_COMMUNITY): Payer: Self-pay | Admitting: *Deleted

## 2021-10-07 ENCOUNTER — Observation Stay (HOSPITAL_BASED_OUTPATIENT_CLINIC_OR_DEPARTMENT_OTHER): Payer: Medicare Other

## 2021-10-07 DIAGNOSIS — R079 Chest pain, unspecified: Secondary | ICD-10-CM | POA: Diagnosis not present

## 2021-10-07 DIAGNOSIS — R42 Dizziness and giddiness: Secondary | ICD-10-CM

## 2021-10-07 LAB — COMPREHENSIVE METABOLIC PANEL
ALT: 23 U/L (ref 0–44)
AST: 18 U/L (ref 15–41)
Albumin: 3.9 g/dL (ref 3.5–5.0)
Alkaline Phosphatase: 93 U/L (ref 38–126)
Anion gap: 8 (ref 5–15)
BUN: 25 mg/dL — ABNORMAL HIGH (ref 8–23)
CO2: 28 mmol/L (ref 22–32)
Calcium: 9.1 mg/dL (ref 8.9–10.3)
Chloride: 107 mmol/L (ref 98–111)
Creatinine, Ser: 0.92 mg/dL (ref 0.44–1.00)
GFR, Estimated: >60 mL/min (ref >60)
Glucose, Bld: 99 mg/dL (ref 70–99)
Potassium: 4.1 mmol/L (ref 3.5–5.1)
Sodium: 143 mmol/L (ref 135–145)
Total Bilirubin: 0.5 mg/dL (ref 0.3–1.2)
Total Protein: 6.6 g/dL (ref 6.5–8.1)

## 2021-10-07 LAB — ECHOCARDIOGRAM COMPLETE
AR max vel: 1.61 cm2
AV Area VTI: 1.55 cm2
AV Area mean vel: 1.73 cm2
AV Mean grad: 6.5 mmHg
AV Peak grad: 12.6 mmHg
Ao pk vel: 1.78 m/s
Area-P 1/2: 2.6 cm2
S' Lateral: 3.7 cm

## 2021-10-07 LAB — TROPONIN I (HIGH SENSITIVITY)
Troponin I (High Sensitivity): 4 ng/L (ref <18)
Troponin I (High Sensitivity): 5 ng/L (ref <18)

## 2021-10-07 LAB — MAGNESIUM: Magnesium: 2.4 mg/dL (ref 1.7–2.4)

## 2021-10-07 LAB — CBC
HCT: 39.7 % (ref 36.0–46.0)
Hemoglobin: 12.2 g/dL (ref 12.0–15.0)
MCH: 29 pg (ref 26.0–34.0)
MCHC: 30.7 g/dL (ref 30.0–36.0)
MCV: 94.3 fL (ref 80.0–100.0)
Platelets: 246 10*3/uL (ref 150–400)
RBC: 4.21 MIL/uL (ref 3.87–5.11)
RDW: 14.8 % (ref 11.5–15.5)
WBC: 6.6 10*3/uL (ref 4.0–10.5)
nRBC: 0 % (ref 0.0–0.2)

## 2021-10-07 LAB — HIV ANTIBODY (ROUTINE TESTING W REFLEX): HIV Screen 4th Generation wRfx: NONREACTIVE

## 2021-10-07 MED ORDER — ONDANSETRON HCL 4 MG/2ML IJ SOLN: 4.0000 mg | Freq: Four times a day (QID) | INTRAMUSCULAR | Status: DC | PRN

## 2021-10-07 MED ORDER — PERFLUTREN LIPID MICROSPHERE
1.0000 mL | INTRAVENOUS | Status: AC | PRN
  Administered 2021-10-07: 2 mL via INTRAVENOUS
  Filled 2021-10-07: qty 10

## 2021-10-07 MED ORDER — HEPARIN SODIUM (PORCINE) 5000 UNIT/ML IJ SOLN: 5000.0000 [IU] | Freq: Three times a day (TID) | INTRAMUSCULAR | Status: DC

## 2021-10-07 MED ORDER — ACETAMINOPHEN 325 MG PO TABS: 650.0000 mg | ORAL_TABLET | ORAL | Status: DC | PRN

## 2021-10-07 MED ORDER — ALPRAZOLAM 0.5 MG PO TABS: 0.2500 mg | ORAL_TABLET | Freq: Two times a day (BID) | ORAL | Status: DC | PRN

## 2021-10-07 NOTE — ED Notes (Signed)
Echo at bedside

## 2021-10-07 NOTE — Assessment & Plan Note (Signed)
Atypical chest pain x4 episodes lasting several minutes ?Troponin 4, 5 ?D-dimer 0.44 ?Chest x-ray normal ?Patient likely to refuse any allopathic medications as she likes to do alternative medicine only ? ?

## 2021-10-07 NOTE — H&P (Signed)
?History and Physical  ? ? ?Patient: Chelsea Bell ZSW:[id number] DOB: 1955-08-23 ?DOA: 10/06/2021 ?DOS: the patient was seen and examined on 10/07/2021 ?PCP: Patient, No Pcp Per (Inactive)  ?Patient coming from: Home ? ?Chief Complaint:  ?Chief Complaint  ?Patient presents with  ? Hypertension  ? ?HPI: Chelsea Bell is a 66 y.o. female with medical history significant of GERD, COPD, who is on no daily medications and tries to participate in alternative medicine, presents ED with chief complaint of chest pain.  Patient reports she had 4 episodes of chest pain.  She describes the pain as a tightness that lasted several minutes.  She associated with dizziness.  She reports she was in her normal state of health right up until this evening after dinner.  When the first episode came on.  It was sudden in onset without any provocative factors.  Patient reports she had paresthesias in her fingers, feet, legs, body.  Patient reports that her blood pressure was being checked during these episodes and was told it was spiking so she needed to come into the ER.  Patient reports each episode was progressively more intense in the last.  Patient denies any nausea, palpitations, dyspnea.  She reports she has never had anything like this before.  Patient has no other complaints. ?Review of Systems: As mentioned in the history of present illness. All other systems reviewed and are negative. ?Past Medical History:  ?Diagnosis Date  ? Cancer [facility])   ? endometrial cancer- surgery only  ? GERD (gastroesophageal reflux disease)   ? ?Past Surgical History:  ?Procedure Laterality Date  ? ABDOMINAL HYSTERECTOMY    ? COLONOSCOPY WITH PROPOFOL N/A 06/09/2014  ? Procedure: COLONOSCOPY WITH PROPOFOL;  Surgeon: Cleotis Nipper, MD;  Location: [facility];  Service: Endoscopy;  Laterality: N/A;  ? DILATION AND CURETTAGE OF UTERUS    ? LAPAROSCOPIC OVARIAN    ? diagnostic with 1 ovary removed , then Hysterectomy(endometrial cancer) with  other ovary removed  ? TONSILLECTOMY    ? ?Social History:  reports that she has never smoked. She has never used smokeless tobacco. She reports that she does not drink alcohol and does not use drugs. ? ?Allergies  ?Allergen Reactions  ? Dilaudid [Hydromorphone Hcl] Itching  ? Percocet [Oxycodone-Acetaminophen] Itching  ? Amoxicillin Itching  ?  + headache.   ? Codeine Itching  ?  + Headache.   ? Nickel Dermatitis  ?  "itching, skin blisters"  ? ? ?History reviewed. No pertinent family history. ? ?Prior to Admission medications   ?Medication Sig Start Date End Date Taking? Authorizing Provider  ?albuterol (VENTOLIN HFA) 108 (90 Base) MCG/ACT inhaler Inhale 1-2 puffs into the lungs every 6 (six) hours as needed for wheezing or shortness of breath. 07/21/20   Loura Halt A, NP  ?azithromycin (ZITHROMAX) 250 MG tablet Take 1 tablet (250 mg total) by mouth daily. Take first 2 tablets together, then 1 every day until finished. 07/30/20   Faustino Congress, NP  ?benzonatate (TESSALON) 100 MG capsule Take 1 capsule (100 mg total) by mouth every 8 (eight) hours. 07/21/20   Loura Halt A, NP  ?ibuprofen (ADVIL,MOTRIN) 200 MG tablet Take 400-600 mg by mouth every 6 (six) hours as needed for headache or mild pain.    [provider]  ?ipratropium (ATROVENT) 0.06 % nasal spray Place 2 sprays into both nostrils 4 (four) times daily. 04/06/16   Billy Fischer, MD  ?OVER THE COUNTER MEDICATION Take 1 tablet by mouth  at bedtime. Jerome PM-- (helps get a good nights sleep)    [provider]  ?OVER THE COUNTER MEDICATION Take 1 tablet by mouth every morning. Jeunesse AM-- (helps get the day started/focus)    [provider]  ?OVER THE COUNTER MEDICATION Take 1 each by mouth every morning. Onguard oil (doterra brand) --puts a drop on tongue    [provider]  ?OVER THE COUNTER MEDICATION Take 1 drop by mouth every morning. Frankinsense (doterra Brand) --one drop on tongue.    [provider]  ?OVER THE COUNTER MEDICATION Apply 1 application topically at bedtime. Margirine, lavender and peppermint. (doterra brand)--Rubs on legs at night.    [provider]  ?OVER THE COUNTER MEDICATION Apply 1 application topically at bedtime. Deep blue (doterra brand)-- rubs on lower back    [provider]  ?peppermint oil liquid Take 1 drop by mouth at bedtime. Doterra Brand. --One drop on tongue.    [provider]  ?polyvinyl alcohol (LIQUIFILM TEARS) 1.4 % ophthalmic solution Place 1 drop into both eyes every morning.    [provider]  ?promethazine-dextromethorphan (PROMETHAZINE-DM) 6.25-15 MG/5ML syrup Take 5 mLs by mouth 4 (four) times daily as needed for cough. 07/30/20   Faustino Congress, NP  ?sodium chloride (OCEAN) 0.65 % SOLN nasal spray Place 2 sprays into both nostrils daily as needed for congestion.    [provider]  ? ? ?Physical Exam: ?Vitals:  ? 10/07/21 0000 10/07/21 0100 10/07/21 0200 10/07/21 0300  ?BP: 135/67 (!) 134/53 (!) 126/43 (!) 133/58  ?Pulse: 73 75 64 72  ?Resp: '20 19 20 '$ (!) 23  ?Temp:      ?TempSrc:      ?SpO2: 100% 97% 97% 98%  ? ?1.  General: ?Patient lying supine in bed,  no acute distress ?  ?2. Psychiatric: ?Alert and oriented x 3, mood and behavior normal for situation, pleasant and cooperative with exam ?  ?3. Neurologic: ?Speech and language are normal, face is symmetric, moves all 4 extremities voluntarily, at baseline without acute deficits on limited exam ?  ?4. HEENMT:  ?Head is atraumatic, normocephalic, pupils reactive to light, neck is supple, trachea is midline, mucous membranes are moist ?  ?5. Respiratory : ?Lungs are clear to auscultation bilaterally without wheezing, rhonchi, rales, no cyanosis, no increase in work of breathing or accessory muscle use ?  ?6. Cardiovascular : ?Heart rate normal, rhythm is regular, no murmurs, rubs or gallops, no peripheral edema, peripheral pulses palpated ?  ?7.  Gastrointestinal:  ?Abdomen is soft, nondistended, nontender to palpation bowel sounds active, no masses or organomegaly palpated ?  ?8. Skin:  ?Skin is warm, dry and intact without rashes, acute lesions, or ulcers on limited exam ?  ?9.Musculoskeletal:  ?No acute deformities or trauma, no asymmetry in tone, no peripheral edema, peripheral pulses palpated, no tenderness to palpation in the extremities ? ?Data Reviewed: ?In the ED ?Temp 98, heart rate 69-73, respiratory rate 19-21, blood pressure 144/64, satting 100% ?No leukocytosis, hemoglobin 12.6 ?D-dimer 0.44 ?Troponin 4, 5 ?CT head showed no acute change ?Chest x-ray shows normal chest radiographs ?EKG shows a heart rate of 69, sinus rhythm, QTc 445 ?I requested that PAs speak with cardiology prior to admission.  Since troponins were stable and not uptrending, cardiology  ? ?Assessment and Plan: ?* Chest pain ?Atypical chest pain x4 episodes lasting several minutes ?Troponin 4, 5 ?D-dimer 0.44 ?Chest x-ray normal ?Patient likely to refuse any allopathic medications as she  likes to do alternative medicine only ? ? ? ? ? ? Advance Care Planning:   Code Status: Full Code  ? ?Consults:  ? ?Family Communication: No family at bedside ? ?Severity of Illness: ?The appropriate patient status for this patient is OBSERVATION. Observation status is judged to be reasonable and necessary in order to provide the required intensity of service to ensure the patient's safety. The patient's presenting symptoms, physical exam findings, and initial radiographic and laboratory data in the context of their medical condition is felt to place them at decreased risk for further clinical deterioration. Furthermore, it is anticipated that the patient will be medically stable for discharge from the hospital within 2 midnights of admission.  ? ?Author: ?Rolla Plate, DO ?10/07/2021 6:15 AM ? ?For on call review [email or web address].  ?

## 2021-10-07 NOTE — Discharge Summary (Signed)
Physician Discharge Summary  ?Chelsea Bell EXH:[id number] DOB: 27-Jun-1956 DOA: 10/06/2021 ? ?PCP: Patient, No Pcp Per (Inactive) ? ?Admit date: 10/06/2021 ?Discharge date: 10/07/2021 ? ?Admitted From: home ?Disposition:  home ? ?Recommendations for Outpatient Follow-up:  ?Outpatient follow-up with cardiology will be made ?Patient will also receive 30-day event monitor ? ?Home Health: ?Equipment/Devices: ? ?Discharge Condition: Stable ?CODE STATUS: Full code ?Diet recommendation: Heart healthy ? ?Brief/Interim Summary: ?66 year old female with a history of GERD, COPD, presents to the emergency room with transient episodes of dizziness and chest pressure.  Episodes lasted a few minutes and spontaneously resolved.  She was evaluated in the emergency room where troponins, D-dimer were noted to be negative.  EKG was also unrevealing.  Echocardiogram performed showed ejection fraction of 50 to 55%.  Left ventricular internal cavity size was mildly dilated.  Right ventricular size mildly enlarged.  Right atrial size was moderately dilated.  Case was reviewed with Dr. Sallyanne Kuster.  It was felt possible that patient's episodes may have been related to an underlying arrhythmia that was not picked up on telemetry reading.  Patient did not have any recurrence of symptoms since being monitored in the emergency room.  It was felt reasonable to have the patient discharge and follow-up with cardiology as an outpatient.  Was also recommended the patient a 30-day event monitor.  Patient was advised to return to the emergency room if she had any recurrence of chest pain, shortness of breath, dizziness or any other new symptoms. ? ?Discharge Diagnoses:  ?Principal Problem: ?  Chest pain ? ? ? ?Discharge Instructions ? ?Discharge Instructions   ? ? Diet - low sodium heart healthy   Complete by: As directed ?  ? Increase activity slowly   Complete by: As directed ?  ? ?  ? ?Allergies as of 10/07/2021   ? ?   Reactions  ? Dilaudid  [hydromorphone Hcl] Itching  ? Percocet [oxycodone-acetaminophen] Itching  ? Amoxicillin Itching  ? + headache.   ? Codeine Itching  ? + Headache.   ? Nickel Dermatitis  ? "itching, skin blisters"  ? ?  ? ?  ?Medication List  ?  ? ?STOP taking these medications   ? ?azithromycin 250 MG tablet ?Commonly known as: ZITHROMAX ?  ?benzonatate 100 MG capsule ?Commonly known as: TESSALON ?  ?ibuprofen 200 MG tablet ?Commonly known as: ADVIL ?  ?promethazine-dextromethorphan 6.25-15 MG/5ML syrup ?Commonly known as: PROMETHAZINE-DM ?  ? ?  ? ?TAKE these medications   ? ?albuterol 108 (90 Base) MCG/ACT inhaler ?Commonly known as: VENTOLIN HFA ?Inhale 1-2 puffs into the lungs every 6 (six) hours as needed for wheezing or shortness of breath. ?  ?ipratropium 0.06 % nasal spray ?Commonly known as: Atrovent ?Place 2 sprays into both nostrils 4 (four) times daily. ?  ?OVER THE COUNTER MEDICATION ?Take 1 tablet by mouth at bedtime. Jenkins PM-- (helps get a good nights sleep) ?  ?OVER THE COUNTER MEDICATION ?Take 1 tablet by mouth every morning. Jeunesse AM-- (helps get the day started/focus) ?  ?OVER THE COUNTER MEDICATION ?Take 1 each by mouth every morning. Onguard oil (doterra brand) --puts a drop on tongue ?  ?OVER THE COUNTER MEDICATION ?Take 1 drop by mouth every morning. Frankinsense (doterra Brand) --one drop on tongue. ?  ?OVER THE COUNTER MEDICATION ?Apply 1 application topically at bedtime. Margirine, lavender and peppermint. (doterra brand)--Rubs on legs at night. ?  ?OVER THE COUNTER MEDICATION ?Apply 1 application topically at bedtime. Deep blue (doterra brand)-- rubs  on lower back ?  ?peppermint oil liquid ?Take 1 drop by mouth at bedtime. Doterra Brand. --One drop on tongue. ?  ?polyvinyl alcohol 1.4 % ophthalmic solution ?Commonly known as: LIQUIFILM TEARS ?Place 1 drop into both eyes every morning. ?  ?sodium chloride 0.65 % Soln nasal spray ?Commonly known as: OCEAN ?Place 2 sprays into both nostrils daily as  needed for congestion. ?  ? ?  ? ? Follow-up Information   ? ? [place]. Go to .   ?Specialty: Emergency Medicine ?Why: If symptoms worsen ?Contact information: ?[place] ?[phone] mc ?[place] ?[phone] ? ?  ?  ? ? [facility]. Call .   ?Why: to schedule an appointment and establish care ?Contact information: ?[place] [address] ?[phone] ? ? ?  ?  ? ? Arnoldo Lenis, MD Follow up.   ?Specialty: Cardiology ?Why: office will call you with appointment  ?they will also contact you for heart monitor ?Contact information: ?[place] ?[place] [address] ?[phone] ? ? ?  ?  ? ?  ?  ? ?  ? ?Allergies  ?Allergen Reactions  ? Dilaudid [Hydromorphone Hcl] Itching  ? Percocet [Oxycodone-Acetaminophen] Itching  ? Amoxicillin Itching  ?  + headache.   ? Codeine Itching  ?  + Headache.   ? Nickel Dermatitis  ?  "itching, skin blisters"  ? ? ?Consultations: ? ? ? ?Procedures/Studies: ?DG Chest 2 View ? ?Result Date: 10/06/2021 ?CLINICAL DATA:  Chest pressure EXAM: CHEST - 2 VIEW COMPARISON:  07/30/2020 FINDINGS: Lungs are clear.  No pleural effusion or pneumothorax. The heart is normal in size. Epicardial fat pad along the left heart border. Mild degenerative changes of the mid thoracic spine. IMPRESSION: Normal chest radiographs. Electronically Signed   By: Julian Hy M.D.   On: 10/06/2021 23:21  ? ?CT Head Wo Contrast ? ?Result Date: 10/06/2021 ?CLINICAL DATA:  Dizziness EXAM: CT HEAD WITHOUT CONTRAST TECHNIQUE: Contiguous axial images were obtained from the base of the skull through the vertex without intravenous contrast. RADIATION DOSE REDUCTION: This exam was performed according to the departmental dose-optimization program which includes automated exposure control, adjustment of the mA and/or kV according to patient size and/or use of iterative reconstruction technique. COMPARISON:  None. FINDINGS:  Brain: Normal anatomic configuration. No abnormal intra or extra-axial mass lesion or fluid collection. No abnormal mass effect or midline shift. No evidence of acute intracranial hemorrhage or infarct. Ventricular size is normal. Cerebellum unremarkable. Vascular: Unremarkable Skull: Intact Sinuses/Orbits: Paranasal sinuses are clear. Orbits are unremarkable. Other: Mastoid air cells and middle ear cavities are clear. IMPRESSION: No acute intracranial abnormality. Electronically Signed   By: Fidela Salisbury M.D.   On: 10/06/2021 23:16  ? ?ECHOCARDIOGRAM COMPLETE ? ?Result Date: 10/07/2021 ?   ECHOCARDIOGRAM REPORT   Patient Name:   Chelsea Bell Date of Exam: 10/07/2021 Medical Rec #:  [id number]           Height:       67.0 in Accession #:    [id number]          Weight:       300.0 lb Date of Birth:  1955-10-05          BSA:          2.403 m? Patient Age:    86 years            BP:  141/64 mmHg Patient Gender: F                   HR:           64 bpm. Exam Location:  [facility] Procedure: 2D Echo, Cardiac Doppler and Color Doppler Indications:    Chest Pain R07.9  History:        Patient has no prior history of Echocardiogram examinations.                 Cancer ([place]) (From Hx), GERD, Obesity.  Sonographer:    Alvino Chapel RCS Referring Phys: [id number] ASIA B Potter  1. Left ventricular ejection fraction, by estimation, is 50 to 55%. The left ventricle has low normal function. The left ventricle has no regional wall motion abnormalities. The left ventricular internal cavity size was mildly dilated. Left ventricular diastolic parameters were normal.  2. Right ventricular systolic function is normal. The right ventricular size is mildly enlarged. Tricuspid regurgitation signal is inadequate for assessing PA pressure.  3. Left atrial size was mildly dilated.  4. Right atrial size was moderately dilated.  5. The mitral valve is normal in structure. No evidence of mitral valve regurgitation.   6. The aortic valve is tricuspid. There is mild thickening of the aortic valve. Aortic valve regurgitation is trivial. Aortic valve sclerosis is present, with no evidence of aortic valve stenosis. FINDINGS

## 2021-10-07 NOTE — ED Notes (Signed)
Food tray at bedside. Patient resting in bed with eyes closed. Respirations even and unlabored. NAD noted.  ?

## 2021-10-07 NOTE — Progress Notes (Signed)
*  PRELIMINARY RESULTS* ?Echocardiogram ?2D Echocardiogram has been performed with Definity. ? ?Chelsea Bell ?10/07/2021, 12:35 PM ?

## 2021-10-11 ENCOUNTER — Telehealth: Payer: Self-pay | Admitting: Physician Assistant

## 2021-10-11 NOTE — Telephone Encounter (Signed)
Patient feels she needs to be seen sooner than the middle of May with the symptoms she was having when she went to the hospital.  ?

## 2021-10-12 ENCOUNTER — Encounter (INDEPENDENT_AMBULATORY_CARE_PROVIDER_SITE_OTHER): Payer: Medicare Other

## 2021-10-12 DIAGNOSIS — R42 Dizziness and giddiness: Secondary | ICD-10-CM

## 2021-10-12 NOTE — Telephone Encounter (Signed)
MyChart message sent to pt

## 2021-10-15 NOTE — Telephone Encounter (Signed)
Pt notified and verbalized understanding of having May appt. Pt is wearing 30 day heart monitor and [facility] needs results before her appointment.  ?

## 2021-10-16 ENCOUNTER — Other Ambulatory Visit: Payer: Self-pay

## 2021-10-16 ENCOUNTER — Other Ambulatory Visit: Payer: Self-pay | Admitting: *Deleted

## 2021-10-16 DIAGNOSIS — R42 Dizziness and giddiness: Secondary | ICD-10-CM

## 2021-11-20 NOTE — Progress Notes (Deleted)
Cardiology Office Note    Date:  11/20/2021   ID:  Chelsea Bell, DOB 1956/05/29, MRN [id number]   PCP:  Patient, No Pcp Per (Inactive)   [place]  Cardiologist:  None *** Advanced Practice Provider:  No care team member to display Electrophysiologist:  None   [id number]}   No chief complaint on file.   History of Present Illness:  Chelsea Bell is a 66 y.o. female ***    Past Medical History:  Diagnosis Date   Cancer [facility])    endometrial cancer- surgery only   GERD (gastroesophageal reflux disease)     Past Surgical History:  Procedure Laterality Date   ABDOMINAL HYSTERECTOMY     COLONOSCOPY WITH PROPOFOL N/A 06/09/2014   Procedure: COLONOSCOPY WITH PROPOFOL;  Surgeon: Cleotis Nipper, MD;  [facility];  Service: Endoscopy;  Laterality: N/A;   DILATION AND CURETTAGE OF UTERUS     LAPAROSCOPIC OVARIAN     diagnostic with 1 ovary removed , then Hysterectomy(endometrial cancer) with other ovary removed   TONSILLECTOMY      Current Medications: No outpatient medications have been marked as taking for the 11/27/21 encounter (Appointment) with [name], PA-C.     Allergies:   Dilaudid [hydromorphone hcl], Percocet [oxycodone-acetaminophen], Amoxicillin, Codeine, and Nickel   Social History   Socioeconomic History   Marital status: Married    Spouse name: Not on file   Number of children: Not on file   Years of education: Not on file   Highest education level: Not on file  Occupational History   Not on file  Tobacco Use   Smoking status: Never   Smokeless tobacco: Never  Substance and Sexual Activity   Alcohol use: No   Drug use: No   Sexual activity: Yes  Other Topics Concern   Not on file  Social History Narrative   Not on file   Social Determinants of Health   Financial Resource Strain: Not on file  Food Insecurity: Not on file  Transportation Needs: Not on file  Physical Activity: Not on  file  Stress: Not on file  Social Connections: Not on file     Family History:  The patient's ***family history is not on file.   ROS:   Please see the history of present illness.    ROS All other systems reviewed and are negative.   PHYSICAL EXAM:   VS:  There were no vitals taken for this visit.  Physical Exam  GEN: Well nourished, well developed, in no acute distress  HEENT: normal  Neck: no JVD, carotid bruits, or masses Cardiac:RRR; no murmurs, rubs, or gallops  Respiratory:  clear to auscultation bilaterally, normal work of breathing GI: soft, nontender, nondistended, + BS Ext: without cyanosis, clubbing, or edema, Good distal pulses bilaterally MS: no deformity or atrophy  Skin: warm and dry, no rash Neuro:  Alert and Oriented x 3, Strength and sensation are intact Psych: euthymic mood, full affect  Wt Readings from Last 3 Encounters:  07/30/20 300 lb (136.1 kg)  10/05/14 297 lb (134.7 kg)  06/09/14 294 lb (133.4 kg)      Studies/Labs Reviewed:   EKG:  EKG is*** ordered today.  The ekg ordered today demonstrates ***  Recent Labs: 10/07/2021: ALT 23; BUN 25; Creatinine, Ser 0.92; Hemoglobin 12.2; Magnesium 2.4; Platelets 246; Potassium 4.1; Sodium 143   Lipid Panel No results found for: CHOL, TRIG, HDL, CHOLHDL, VLDL, LDLCALC, LDLDIRECT  Additional  studies/ records that were reviewed today include:  ***   Risk Assessment/Calculations:   {Does this patient have ATRIAL FIBRILLATION?:[phone]}     ASSESSMENT:    No diagnosis found.   PLAN:  In order of problems listed above:    Shared Decision Making/Informed Consent   {Are you ordering a CV Procedure (e.g. stress test, cath, DCCV, TEE, etc)?   Press F2        :[id number]}    Medication Adjustments/Labs and Tests Ordered: Current medicines are reviewed at length with the patient today.  Concerns regarding medicines are outlined above.  Medication changes, Labs and Tests ordered today are listed  in the Patient Instructions below. There are no Patient Instructions on file for this visit.   [name], PA-C  11/20/2021 9:58 AM    [facility] [address], [place], [place]  [address] Phone: [phone]; Fax: [phone]

## 2021-11-27 ENCOUNTER — Ambulatory Visit: Payer: PRIVATE HEALTH INSURANCE | Admitting: Physician Assistant

## 2021-12-21 ENCOUNTER — Encounter: Payer: Self-pay | Admitting: Cardiology

## 2021-12-24 ENCOUNTER — Ambulatory Visit (INDEPENDENT_AMBULATORY_CARE_PROVIDER_SITE_OTHER): Payer: Medicare Other | Admitting: Cardiology

## 2021-12-24 ENCOUNTER — Encounter: Payer: Self-pay | Admitting: Cardiology

## 2021-12-24 VITALS — BP 118/74 | HR 65 | Ht 66.0 in | Wt 295.8 lb

## 2021-12-24 DIAGNOSIS — R0789 Other chest pain: Secondary | ICD-10-CM | POA: Diagnosis not present

## 2021-12-24 DIAGNOSIS — R42 Dizziness and giddiness: Secondary | ICD-10-CM | POA: Diagnosis not present

## 2021-12-24 NOTE — Patient Instructions (Signed)
Medication Instructions:  Continue all current medications.  Labwork: none  Testing/Procedures: none  Follow-Up: As needed.    Any Other Special Instructions Will Be Listed Below (If Applicable).  If you need a refill on your cardiac medications before your next appointment, please call your pharmacy.  

## 2021-12-24 NOTE — Progress Notes (Signed)
Clinical Summary Ms. Manus is a 66 y.o.female seen today as a new patient for the following medical problems.      Chest pain/dizzienss -admit 09/2021 with dizziness and chest pressure - negative workup for ACS with EKG and enzymes, ddimer neg - 09/2021 echo LVEF 50-55%, no WMAs - 09/2021 30 day monitor: SR, rare PACs and PVCs, no significant arrhythmias  - no recurrent symptoms - sudden onset while sitting at dining room table. Started feeling like room was moving a little bit. Got up and walked to kitchen, felt lightheaded and felt she may fall. Checked vitals and bp 160/80 and p 86 right after episode. Episodes coming and going. Felt transient pressure in chest. No positional component.      2. Cardiac screening - coronary calcium score was 0 by outside provider 09/2021     Past Medical History:  Diagnosis Date   Cancer [facility])    endometrial cancer- surgery only   GERD (gastroesophageal reflux disease)      Allergies  Allergen Reactions   Dilaudid [Hydromorphone Hcl] Itching   Percocet [Oxycodone-Acetaminophen] Itching   Amoxicillin Itching    + headache.    Codeine Itching    + Headache.    Nickel Dermatitis    "itching, skin blisters"     Current Outpatient Medications  Medication Sig Dispense Refill   albuterol (VENTOLIN HFA) 108 (90 Base) MCG/ACT inhaler Inhale 1-2 puffs into the lungs every 6 (six) hours as needed for wheezing or shortness of breath. 1 each 0   ipratropium (ATROVENT) 0.06 % nasal spray Place 2 sprays into both nostrils 4 (four) times daily. 15 mL 1   OVER THE COUNTER MEDICATION Take 1 tablet by mouth at bedtime. Jeunesse PM-- (helps get a good nights sleep)     OVER THE COUNTER MEDICATION Take 1 tablet by mouth every morning. Jeunesse AM-- (helps get the day started/focus)     OVER THE COUNTER MEDICATION Take 1 each by mouth every morning. Onguard oil (doterra brand) --puts a drop on tongue     OVER THE COUNTER MEDICATION Take 1 drop  by mouth every morning. Frankinsense (doterra Brand) --one drop on tongue.     OVER THE COUNTER MEDICATION Apply 1 application topically at bedtime. Margirine, lavender and peppermint. (doterra brand)--Rubs on legs at night.     OVER THE COUNTER MEDICATION Apply 1 application topically at bedtime. Deep blue (doterra brand)-- rubs on lower back     peppermint oil liquid Take 1 drop by mouth at bedtime. Doterra Brand. --One drop on tongue.     polyvinyl alcohol (LIQUIFILM TEARS) 1.4 % ophthalmic solution Place 1 drop into both eyes every morning.     sodium chloride (OCEAN) 0.65 % SOLN nasal spray Place 2 sprays into both nostrils daily as needed for congestion.     No current facility-administered medications for this visit.     Past Surgical History:  Procedure Laterality Date   ABDOMINAL HYSTERECTOMY     COLONOSCOPY WITH PROPOFOL N/A 06/09/2014   Procedure: COLONOSCOPY WITH PROPOFOL;  Surgeon: Cleotis Nipper, MD;  Location: [facility];  Service: Endoscopy;  Laterality: N/A;   DILATION AND CURETTAGE OF UTERUS     LAPAROSCOPIC OVARIAN     diagnostic with 1 ovary removed , then Hysterectomy(endometrial cancer) with other ovary removed   TONSILLECTOMY       Allergies  Allergen Reactions   Dilaudid [Hydromorphone Hcl] Itching   Percocet [Oxycodone-Acetaminophen] Itching   Amoxicillin Itching    +  headache.    Codeine Itching    + Headache.    Nickel Dermatitis    "itching, skin blisters"      Family History  Problem Relation Age of Onset   Diabetes Mother    Hypertension Mother    Thyroid disease Mother    Osteoporosis Mother    Arthritis Mother    Cerebrovascular Accident Father    Melanoma Father    Hyperlipidemia Sister    Asthma Sister    Hypertension Sister    Irritable bowel syndrome Sister    Diabetes Maternal Grandmother    Arthritis Maternal Grandmother    Osteoporosis Maternal Grandmother    Heart disease Paternal Grandfather    Cancer Paternal Uncle       Social History Ms. Klomp reports that she has never smoked. She has never used smokeless tobacco. Ms. Agar reports no history of alcohol use.   Review of Systems CONSTITUTIONAL: No weight loss, fever, chills, weakness or fatigue.  HEENT: Eyes: No visual loss, blurred vision, double vision or yellow sclerae.No hearing loss, sneezing, congestion, runny nose or sore throat.  SKIN: No rash or itching.  CARDIOVASCULAR: per hpi RESPIRATORY: No shortness of breath, cough or sputum.  GASTROINTESTINAL: No anorexia, nausea, vomiting or diarrhea. No abdominal pain or blood.  GENITOURINARY: No burning on urination, no polyuria NEUROLOGICAL: No headache, dizziness, syncope, paralysis, ataxia, numbness or tingling in the extremities. No change in bowel or bladder control.  MUSCULOSKELETAL: No muscle, back pain, joint pain or stiffness.  LYMPHATICS: No enlarged nodes. No history of splenectomy.  PSYCHIATRIC: No history of depression or anxiety.  ENDOCRINOLOGIC: No reports of sweating, cold or heat intolerance. No polyuria or polydipsia.  Marland Kitchen   Physical Examination Today's Vitals   12/24/21 0854  BP: 118/74  Pulse: 65  SpO2: 99%  Weight: 295 lb 12.8 oz (134.2 kg)  Height: '5\' 6"'$  (1.676 m)   Body mass index is 47.74 kg/m.  Gen: resting comfortably, no acute distress HEENT: no scleral icterus, pupils equal round and reactive, no palptable cervical adenopathy,  CV: RRR, 2/6 systolic murmur rusb, no jvd Resp: Clear to auscultation bilaterally GI: abdomen is soft, non-tender, non-distended, normal bowel sounds, no hepatosplenomegaly MSK: extremities are warm, no edema.  Skin: warm, no rash Neuro:  no focal deficits Psych: appropriate affect      Assessment and Plan   1.Chest pain/dizziness - isolated episode 3 months ago - negative workup during admission including EKG, enzymes, ddimer, echo - outpatient 30 day monitor was benign - no recurrence of symptoms, monitor at this  time.   F/u as needed     Arnoldo Lenis, M.D.

## 2022-09-05 IMAGING — CT CT HEAD W/O CM
4 series · 16 of 47 positions shown, 18 images · non-contrast
Comparison: None.

CLINICAL DATA: Dizziness



[Series 2: head w o · axial · 0.46mm/px · z∈[-662,-542]mm · 7 of 34 slices shown, 9 images]
[im 5/34  brain]
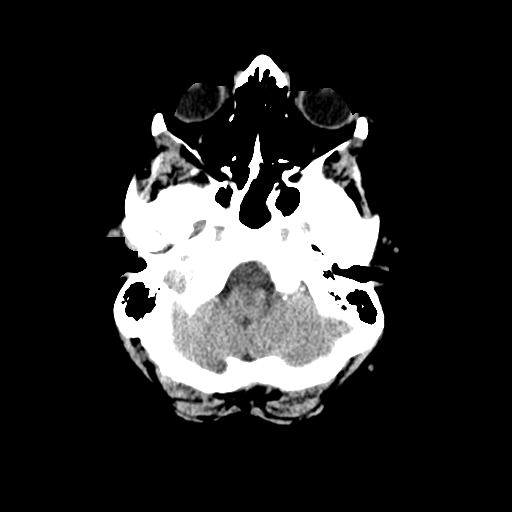
[im 5/34  bone]
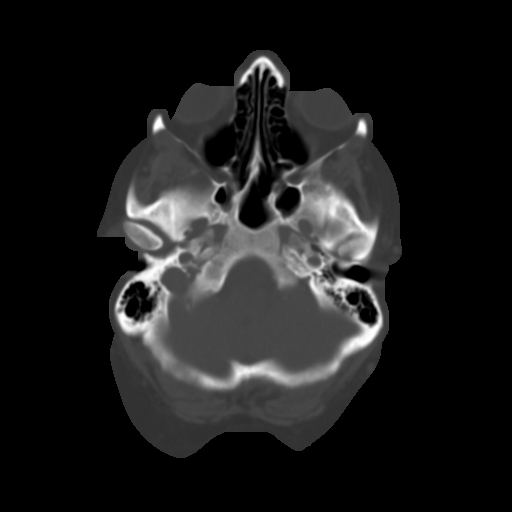
[im 9/34  brain]
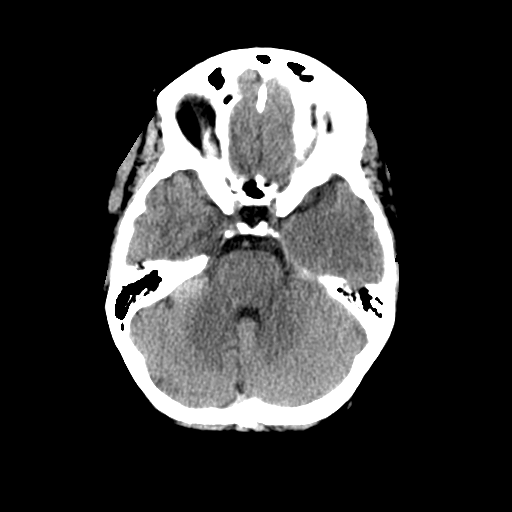
[im 13/34  brain]
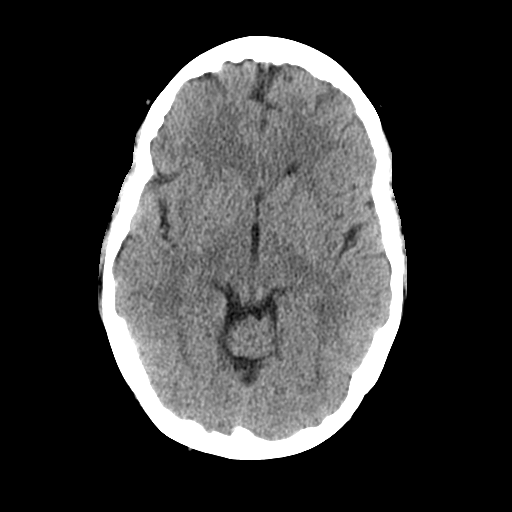
[im 17/34  brain]
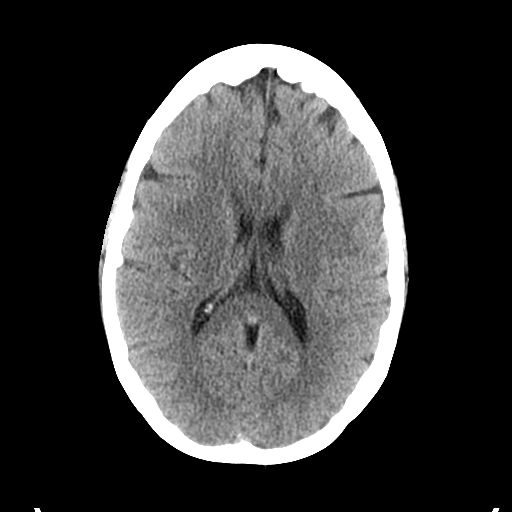
[im 21/34  brain]
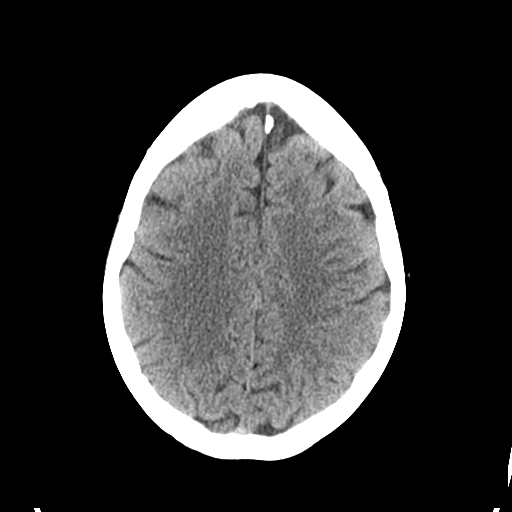
[im 21/34  bone]
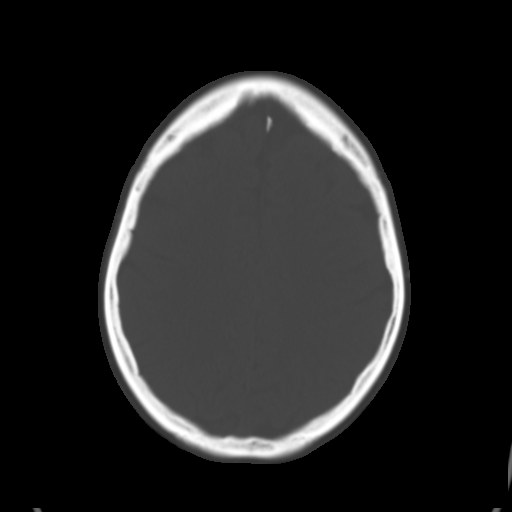
[im 25/34  brain]
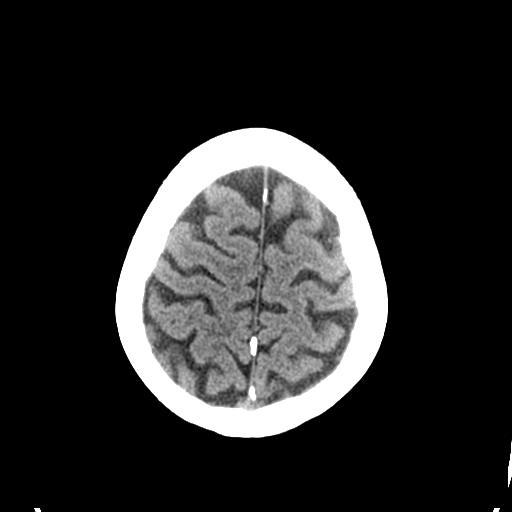
[im 29/34  brain]
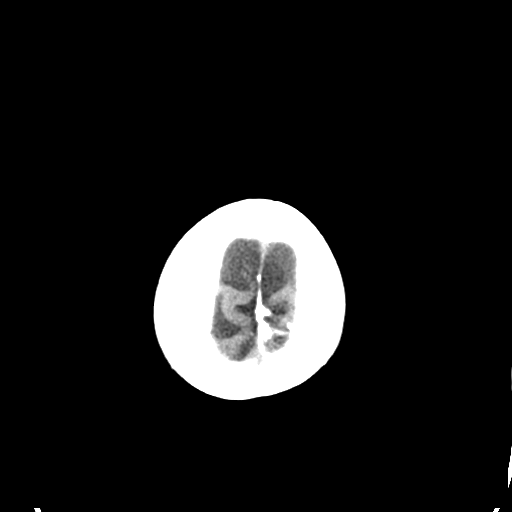

[Series 3: head bone · axial · 0.46mm/px · z∈[-666,-634]mm · 3 of 84 slices shown]
[im 9/84  bone]
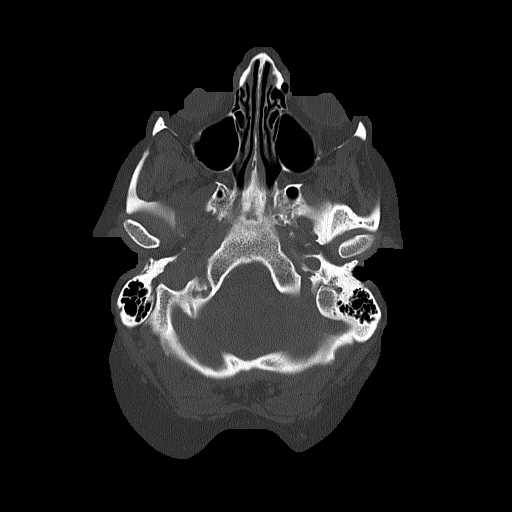
[im 17/84  bone]
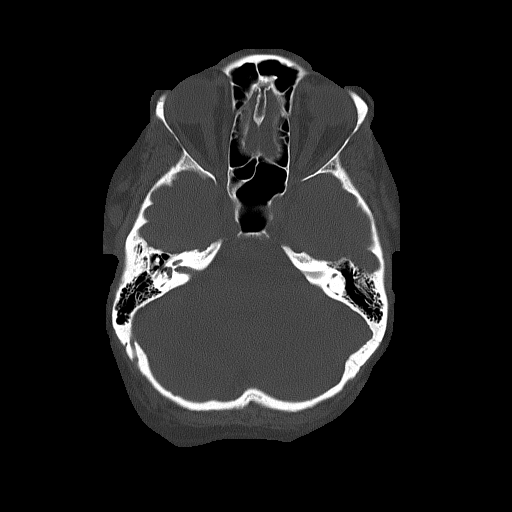
[im 25/84  bone]
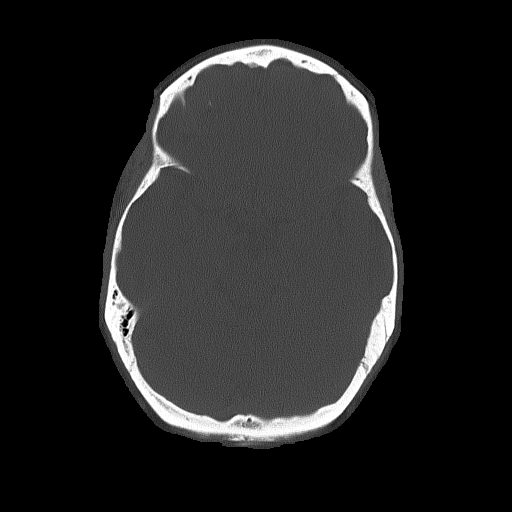

[Series 4: coronal soft · coronal · 0.33mm/px · 3 of 72 slices shown]
[im 24/72  brain]
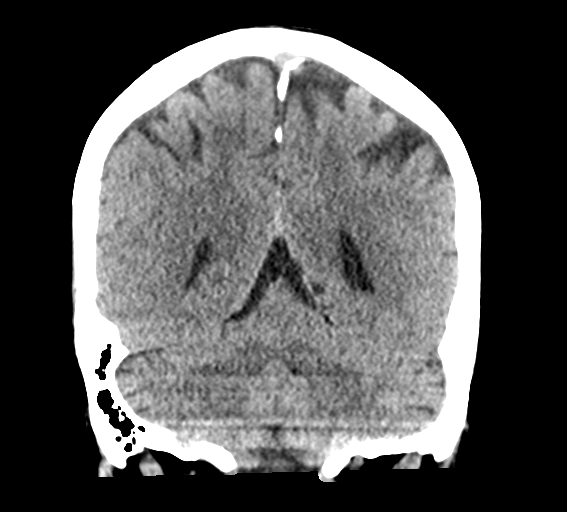
[im 32/72  brain]
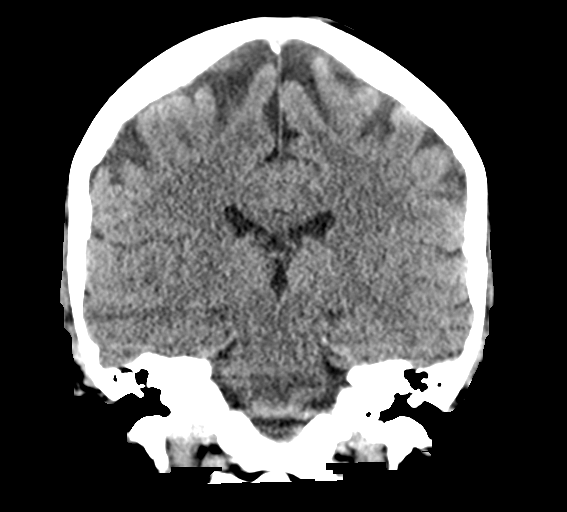
[im 40/72  brain]
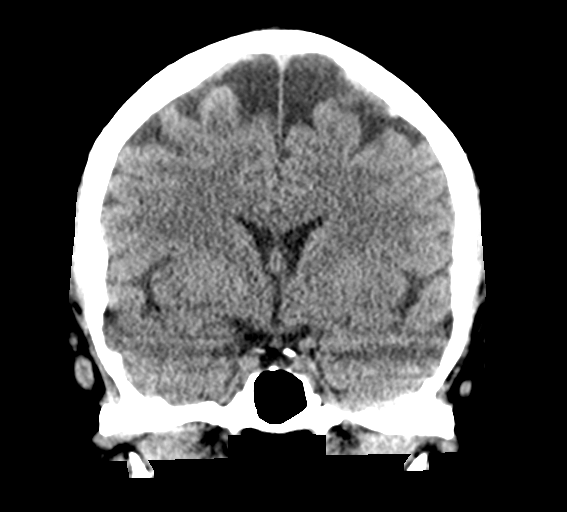

[Series 5: sagittal soft · sagittal · 0.33mm/px · 3 of 63 slices shown]
[im 21/63  brain]
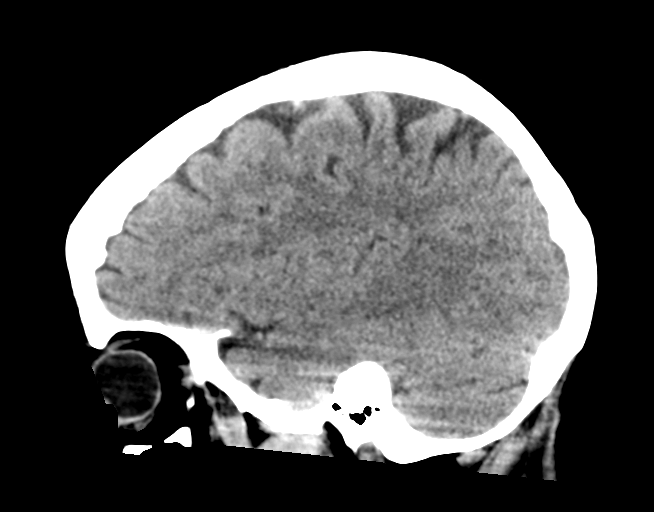
[im 32/63  brain]
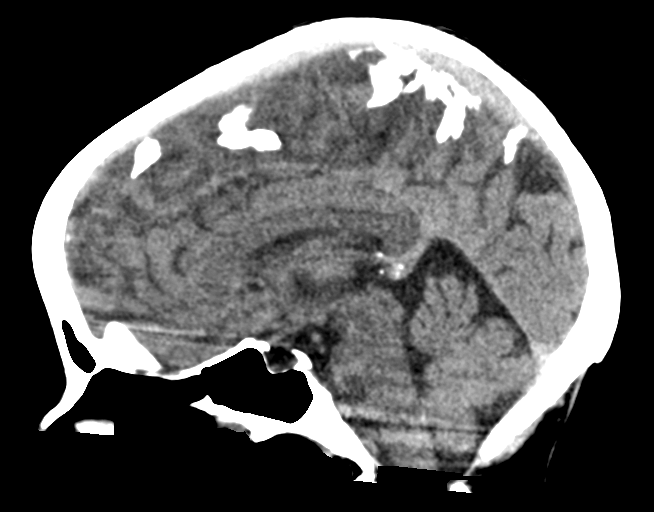
[im 42/63  brain]
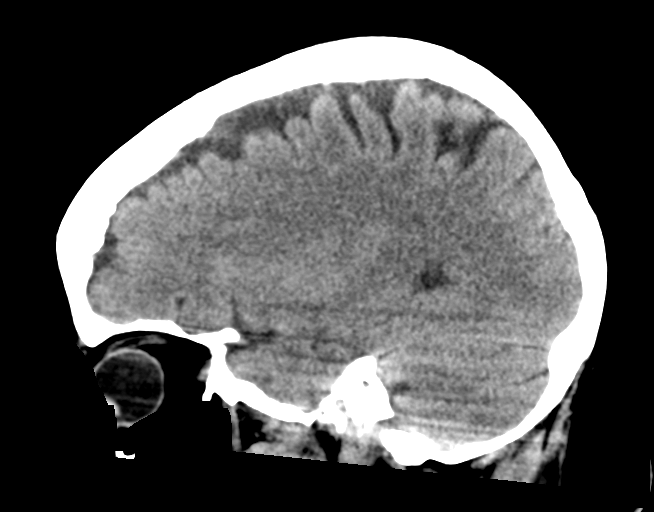

[16 of 47 positions shown; findings below may reference images not displayed]

FINDINGS: Brain: Normal anatomic configuration. No abnormal intra or
extra-axial mass lesion or fluid collection. No abnormal mass effect
or midline shift. No evidence of acute intracranial hemorrhage or
infarct. Ventricular size is normal. Cerebellum unremarkable.

Vascular: Unremarkable

Skull: Intact

Sinuses/Orbits: Paranasal sinuses are clear. Orbits are
unremarkable.

Other: Mastoid air cells and middle ear cavities are clear.
IMPRESSION: No acute intracranial abnormality.

## 2023-02-03 ENCOUNTER — Other Ambulatory Visit: Payer: Self-pay | Admitting: Obstetrics and Gynecology

## 2023-02-03 DIAGNOSIS — Z1231 Encounter for screening mammogram for malignant neoplasm of breast: Secondary | ICD-10-CM

## 2023-02-05 ENCOUNTER — Ambulatory Visit: Payer: Medicare Other

## 2023-02-17 ENCOUNTER — Ambulatory Visit
Admission: RE | Admit: 2023-02-17 | Discharge: 2023-02-17 | Disposition: A | Discharge status: Home / Self Care | Payer: Medicare Other | Source: Ambulatory Visit | Attending: Obstetrics and Gynecology | Admitting: Obstetrics and Gynecology

## 2023-02-17 DIAGNOSIS — Z1231 Encounter for screening mammogram for malignant neoplasm of breast: Secondary | ICD-10-CM

## 2023-02-28 ENCOUNTER — Ambulatory Visit
Admission: RE | Admit: 2023-02-28 | Discharge: 2023-02-28 | Disposition: A | Discharge status: Home / Self Care | Payer: Medicare Other | Source: Ambulatory Visit

## 2023-02-28 VITALS — BP 146/76 | HR 70 | Temp 97.9°F | Resp 20

## 2023-02-28 DIAGNOSIS — J309 Allergic rhinitis, unspecified: Secondary | ICD-10-CM | POA: Diagnosis not present

## 2023-02-28 MED ORDER — FLUTICASONE PROPIONATE 50 MCG/ACT NA SUSP: 1.0000 | Freq: Two times a day (BID) | NASAL | 2 refills | Status: DC

## 2023-02-28 MED ORDER — CETIRIZINE HCL 10 MG PO TABS: 10.0000 mg | ORAL_TABLET | Freq: Every day | ORAL | 2 refills | Status: DC

## 2023-02-28 MED ORDER — PREDNISONE 20 MG PO TABS: 40.0000 mg | ORAL_TABLET | Freq: Every day | ORAL | 0 refills | Status: DC

## 2023-02-28 NOTE — ED Triage Notes (Signed)
Reports her allergies are bothering her x 1 week. Pt went to pharmacy and they told her to use Allegra d and it helped. Has mucus drainage, cough,  and slight facial pain

## 2023-02-28 NOTE — ED Provider Notes (Signed)
[facility]    CSN: [id number] Arrival date & time: 02/28/23  1359      History   Chief Complaint Chief Complaint  Patient presents with   Allergic Reaction    Allergies, congestion, nose stopped upBeen taking Claritin D since last Thurs and Flonase since Mon - Entered by patient    HPI Chelsea Bell is a 67 y.o. female.   Presenting today with 1 week history of nasal congestion, facial pressure, bilateral ear pressure, coughing, scratchy throat.  Denies fever, chills, body aches, chest pain, shortness of breath, abdominal pain, nausea vomiting or diarrhea.  States she has a history of seasonal allergies that tend to flare similarly to this.  She started on Allegra-D but a week ago and then several days later started Flonase as well and notes that these have helped but symptoms persist.    Past Medical History:  Diagnosis Date   Cancer [facility])    endometrial cancer- surgery only   GERD (gastroesophageal reflux disease)     Patient Active Problem List   Diagnosis Date Noted   Chest pain 10/07/2021    Past Surgical History:  Procedure Laterality Date   ABDOMINAL HYSTERECTOMY     COLONOSCOPY WITH PROPOFOL N/A 06/09/2014   Procedure: COLONOSCOPY WITH PROPOFOL;  Surgeon: Florencia Reasons, MD;  Location: [facility];  Service: Endoscopy;  Laterality: N/A;   DILATION AND CURETTAGE OF UTERUS     LAPAROSCOPIC OVARIAN     diagnostic with 1 ovary removed , then Hysterectomy(endometrial cancer) with other ovary removed   TONSILLECTOMY      OB History   No obstetric history on file.      Home Medications    Prior to Admission medications   Medication Sig Start Date End Date Taking? Authorizing Provider  cetirizine (ZYRTEC ALLERGY) 10 MG tablet Take 1 tablet (10 mg total) by mouth daily. 02/28/23  Yes Particia Nearing, PA-C  fluticasone [facility]) 50 MCG/ACT nasal spray Place 1 spray into both nostrils 2 (two) times daily. 02/28/23  Yes Particia Nearing, PA-C  predniSONE (DELTASONE) 20 MG tablet Take 2 tablets (40 mg total) by mouth daily with breakfast. 02/28/23  Yes Particia Nearing, PA-C  albuterol (VENTOLIN HFA) 108 (90 Base) MCG/ACT inhaler Inhale 1-2 puffs into the lungs every 6 (six) hours as needed for wheezing or shortness of breath. Patient not taking: Reported on 12/24/2021 07/21/20   Dahlia Byes A, NP  ipratropium (ATROVENT) 0.06 % nasal spray Place 2 sprays into both nostrils 4 (four) times daily. Patient not taking: Reported on 12/24/2021 04/06/16   Linna Hoff, MD  MYRBETRIQ 50 MG TB24 tablet Take 50 mg by mouth daily.    [provider]  OVER THE COUNTER MEDICATION Take 1 tablet by mouth at bedtime. Jeunesse PM-- (helps get a good nights sleep) Patient not taking: Reported on 12/24/2021    [provider]  OVER THE COUNTER MEDICATION Take 1 tablet by mouth every morning. Jeunesse AM-- (helps get the day started/focus) Patient not taking: Reported on 12/24/2021    [provider]  OVER THE COUNTER MEDICATION Take 1 each by mouth every morning. Onguard oil (doterra brand) --puts a drop on tongue Patient not taking: Reported on 12/24/2021    [provider]  OVER THE COUNTER MEDICATION Take 1 drop by mouth every morning. Frankinsense (doterra Brand) --one drop on tongue. Patient not taking: Reported on 12/24/2021    [provider]  OVER THE COUNTER  MEDICATION Apply 1 application topically at bedtime. Margirine, lavender and peppermint. (doterra brand)--Rubs on legs at night. Patient not taking: Reported on 12/24/2021    [provider]  OVER THE COUNTER MEDICATION Apply 1 application topically at bedtime. Deep blue (doterra brand)-- rubs on lower back Patient not taking: Reported on 12/24/2021    [provider]  peppermint oil liquid Take 1 drop by mouth at bedtime. Doterra Brand. --One drop on tongue. Patient not taking: Reported on 12/24/2021    [provider]  polyvinyl alcohol (LIQUIFILM TEARS) 1.4 % ophthalmic solution Place 1 drop into both eyes every morning. Patient not taking: Reported on 12/24/2021    [provider]  sodium chloride (OCEAN) 0.65 % SOLN nasal spray Place 2 sprays into both nostrils daily as needed for congestion. Patient not taking: Reported on 12/24/2021    [provider]    Family History Family History  Problem Relation Age of Onset   Heart failure Mother    Hyperlipidemia Mother    Diabetes Mother    Hypertension Mother    Thyroid disease Mother    Osteoporosis Mother    Arthritis Mother    Cancer Father        Ear   Cerebrovascular Accident Father    Melanoma Father    Parkinson's disease Father    Stroke Father    Neuropathy Father    Hyperlipidemia Sister    Asthma Sister    Hypertension Sister    Irritable bowel syndrome Sister    Cancer Paternal Uncle    Diabetes Maternal Grandmother    Arthritis Maternal Grandmother    Osteoporosis Maternal Grandmother    Heart disease Paternal Grandfather    Breast cancer Neg Hx     Social History Social History   Tobacco Use   Smoking status: Never   Smokeless tobacco: Never  Substance Use Topics   Alcohol use: No   Drug use: No     Allergies   Dilaudid [hydromorphone hcl], Percocet [oxycodone-acetaminophen], Amoxicillin, Codeine, and Nickel   Review of Systems Review of Systems Per HPI  Physical Exam Triage Vital Signs ED Triage Vitals  Encounter Vitals Group     BP 02/28/23 1421 (!) 146/76     Systolic BP Percentile --      Diastolic BP Percentile --      Pulse Rate 02/28/23 1421 70     Resp 02/28/23 1421 20     Temp 02/28/23 1421 97.9 F (36.6 C)     Temp Source 02/28/23 1421 Oral     SpO2 02/28/23 1421 98 %     Weight --      Height --      Head Circumference --      Peak Flow --      Pain Score 02/28/23 1423 0     Pain Loc --      Pain Education --      Exclude from Growth Chart --    No data  found.  Updated Vital Signs BP (!) 146/76 (BP Location: Right Arm)   Pulse 70   Temp 97.9 F (36.6 C) (Oral)   Resp 20   SpO2 98%   Visual Acuity Right Eye Distance:   Left Eye Distance:   Bilateral Distance:    Right Eye Near:   Left Eye Near:    Bilateral Near:     Physical Exam Vitals and nursing note reviewed.  Constitutional:      Appearance:  Normal appearance.  HENT:     Head: Atraumatic.     Right Ear: External ear normal.     Left Ear: External ear normal.     Ears:     Comments: Mild middle ear effusions bilaterally    Nose: Rhinorrhea present.     Mouth/Throat:     Mouth: Mucous membranes are moist.     Pharynx: Posterior oropharyngeal erythema present.  Eyes:     Extraocular Movements: Extraocular movements intact.     Conjunctiva/sclera: Conjunctivae normal.  Cardiovascular:     Rate and Rhythm: Normal rate and regular rhythm.     Heart sounds: Normal heart sounds.  Pulmonary:     Effort: Pulmonary effort is normal.     Breath sounds: Normal breath sounds. No wheezing or rales.  Musculoskeletal:        General: Normal range of motion.     Cervical back: Normal range of motion and neck supple.  Skin:    General: Skin is warm and dry.  Neurological:     Mental Status: She is alert and oriented to person, place, and time.  Psychiatric:        Mood and Affect: Mood normal.        Thought Content: Thought content normal.      UC Treatments / Results  Labs (all labs ordered are listed, but only abnormal results are displayed) Labs Reviewed - No data to display  EKG   Radiology No results found.  Procedures Procedures (including critical care time)  Medications Ordered in UC Medications - No data to display  Initial Impression / Assessment and Plan / UC Course  I have reviewed the triage vital signs and the nursing notes.  Pertinent labs & imaging results that were available during my care of the patient were reviewed by me and  considered in my medical decision making (see chart for details).     Vitals overall reassuring, exam consistent with allergic rhinitis exacerbation.  Treat with prednisone, Flonase, Zyrtec, sinus rinses and other supportive measures.  Return for worsening symptoms.  Final Clinical Impressions(s) / UC Diagnoses   Final diagnoses:  Allergic sinusitis   Discharge Instructions   None    ED Prescriptions     Medication Sig Dispense Auth. Provider   predniSONE (DELTASONE) 20 MG tablet Take 2 tablets (40 mg total) by mouth daily with breakfast. 10 tablet Particia Nearing, PA-C   fluticasone [facility]) 50 MCG/ACT nasal spray Place 1 spray into both nostrils 2 (two) times daily. 16 g Particia Nearing, [place]   cetirizine (ZYRTEC ALLERGY) 10 MG tablet Take 1 tablet (10 mg total) by mouth daily. 30 tablet Particia Nearing, [place]      PDMP not reviewed this encounter.   Particia Nearing, [place] 02/28/23 1524

## 2023-07-01 DIAGNOSIS — J069 Acute upper respiratory infection, unspecified: Secondary | ICD-10-CM | POA: Diagnosis not present

## 2023-07-01 DIAGNOSIS — R6889 Other general symptoms and signs: Secondary | ICD-10-CM | POA: Diagnosis not present

## 2023-07-22 DIAGNOSIS — E785 Hyperlipidemia, unspecified: Secondary | ICD-10-CM | POA: Diagnosis not present

## 2023-07-22 DIAGNOSIS — Z0001 Encounter for general adult medical examination with abnormal findings: Secondary | ICD-10-CM | POA: Diagnosis not present

## 2023-07-22 DIAGNOSIS — Z23 Encounter for immunization: Secondary | ICD-10-CM | POA: Diagnosis not present

## 2023-08-19 ENCOUNTER — Ambulatory Visit (HOSPITAL_COMMUNITY)
Admission: RE | Admit: 2023-08-19 | Discharge: 2023-08-19 | Disposition: A | Discharge status: Home / Self Care | Payer: Medicare Other | Source: Ambulatory Visit | Attending: Family Medicine | Admitting: Family Medicine

## 2023-08-19 ENCOUNTER — Other Ambulatory Visit (HOSPITAL_COMMUNITY): Payer: Self-pay | Admitting: Family Medicine

## 2023-08-19 DIAGNOSIS — R058 Other specified cough: Secondary | ICD-10-CM

## 2023-10-20 ENCOUNTER — Ambulatory Visit: Payer: Medicare Other | Admitting: Obstetrics

## 2023-10-29 ENCOUNTER — Ambulatory Visit: Payer: Medicare Other | Admitting: Obstetrics and Gynecology

## 2023-10-29 ENCOUNTER — Encounter: Payer: Self-pay | Admitting: Obstetrics and Gynecology

## 2023-10-29 VITALS — BP 127/77 | HR 65 | Ht 66.0 in | Wt 306.4 lb

## 2023-10-29 DIAGNOSIS — R350 Frequency of micturition: Secondary | ICD-10-CM | POA: Diagnosis not present

## 2023-10-29 DIAGNOSIS — N3281 Overactive bladder: Secondary | ICD-10-CM | POA: Diagnosis not present

## 2023-10-29 DIAGNOSIS — N393 Stress incontinence (female) (male): Secondary | ICD-10-CM | POA: Diagnosis not present

## 2023-10-29 LAB — POCT URINALYSIS DIPSTICK
Bilirubin, UA: NEGATIVE
Blood, UA: NEGATIVE
Glucose, UA: NEGATIVE
Ketones, UA: NEGATIVE
Leukocytes, UA: NEGATIVE
Nitrite, UA: NEGATIVE
Protein, UA: NEGATIVE
Spec Grav, UA: 1.02 (ref 1.010–1.025)
Urobilinogen, UA: 0.2 U/dL
pH, UA: 5.5 (ref 5.0–8.0)

## 2023-10-29 NOTE — Assessment & Plan Note (Signed)
-   For treatment of stress urinary incontinence,  non-surgical options include expectant management, weight loss, physical therapy, as well as a pessary.  Surgical options include a midurethral sling, Burch urethropexy, and transurethral injection of a bulking agent. - We discussed that procedural treatments may not be as effective due to high BMI. Not a sling candidate at this time due to weight.  - She does not feel that SUI is as bothersome as the OAB symptoms. Can reassess after pelvic PT.

## 2023-10-29 NOTE — Assessment & Plan Note (Signed)
-   We discussed the symptoms of overactive bladder (OAB), which include urinary urgency, urinary frequency, nocturia, with or without urge incontinence.  While we do not know the exact etiology of OAB, several treatment options exist. We discussed management including behavioral therapy (decreasing bladder irritants, urge suppression strategies, timed voids, bladder retraining), physical therapy, medication; for refractory cases posterior tibial nerve stimulation, sacral neuromodulation, and intravesical botulinum toxin injection.  - Continue with Myrbetriq 50mg .  - We discussed that her recent decrease in soda intake may positively improve her symptoms.  - She is interested in pelvic PT, referral placed.  - Can also consider the addition of pumpkin seed oil to reduce urgency.

## 2023-10-29 NOTE — Progress Notes (Signed)
 New Patient Evaluation and Consultation  Referring Provider: Assunta Found, MD PCP: Assunta Found, MD Date of Service: 10/29/2023  SUBJECTIVE Chief Complaint: New Patient (Initial Visit) Chelsea Bell is a 68 y.o. female complains of SUI and urinary frequency with urgency.)  History of Present Illness: Chelsea Bell is a 68 y.o. White or Caucasian female seen in consultation at the request of Dr Phillips Odor for evaluation of incontinence.     Urinary Symptoms: Leaks urine with cough/ sneeze, laughing, with a full bladder, with movement to the bathroom, and with urgency, UUI > SUI.  Leaks 1-2 time(s) per  day- sometimes small leak, sometimes larger loss of urine .  Pad use: 1-2 pads per day.   Patient is bothered by UI symptoms. Has been on Myrbetriq both 25mg  and 50mg . Has worsened over the last year.   Day time voids: very 1.5- 2hrs, sometimes can go longer.  Nocturia: 1-2 times per night to void. Voiding dysfunction:  empties bladder well.  Patient does not use a catheter to empty bladder.  When urinating, patient feels she has no difficulties Drinks: Drinks lipton tea in AM, mostly water during day, sometimes will drink diet soft drinks but has stopped caffeine recently from recommendations from ENT. Also stopped peppermint.  Adds lemon, lime or orange essential oil to water  UTIs:  0  UTI's in the last year.   Denies history of blood in urine and kidney or bladder stones   Pelvic Organ Prolapse Symptoms:                  Patient Denies a feeling of a bulge the vaginal area.   Bowel Symptom: Bowel movements: 1-2 time(s) per day Stool consistency: soft  Straining: no.  Splinting: no.  Incomplete evacuation: no.  Patient Denies accidental bowel leakage / fecal incontinence Bowel regimen: fiber and prunes  HM Colonoscopy          Upcoming     Colonoscopy (Every 10 Years) Next due on 06/09/2024    06/09/2014  COLONOSCOPY   Only the first 1 history entries  have been loaded, but more history exists.                Sexual Function Sexually active: no.  Sexual orientation: Straight   Pelvic Pain Denies pelvic pain   Past Medical History:  Past Medical History:  Diagnosis Date   Cancer (HCC)    endometrial cancer- surgery only   GERD (gastroesophageal reflux disease)      Past Surgical History:   Past Surgical History:  Procedure Laterality Date   ABDOMINAL HYSTERECTOMY     COLONOSCOPY WITH PROPOFOL N/A 06/09/2014   Procedure: COLONOSCOPY WITH PROPOFOL;  Surgeon: Florencia Reasons, MD;  Location: [facility];  Service: Endoscopy;  Laterality: N/A;   DILATION AND CURETTAGE OF UTERUS     LAPAROSCOPIC OVARIAN     diagnostic with 1 ovary removed , then Hysterectomy(endometrial cancer) with other ovary removed   TONSILLECTOMY       Past OB/GYN History: OB History  Gravida Para Term Preterm AB Living  0 0 0 0 0 0  SAB IAB Ectopic Multiple Live Births  0 0 0 0 0    S/p hysterectomy  Medications: Patient has a current medication list which includes the following prescription(s): fluticasone, myrbetriq, OVER THE COUNTER MEDICATION, OVER THE COUNTER MEDICATION, and pantoprazole.   Allergies: Patient is allergic to dilaudid [hydromorphone hcl], percocet [oxycodone-acetaminophen], amoxicillin, codeine, and nickel.   Social History:  Social History   Tobacco Use   Smoking status: Never   Smokeless tobacco: Never  Vaping Use   Vaping status: Never Used  Substance Use Topics   Alcohol use: No   Drug use: No    Relationship status: widowed Patient lives alone.   Patient is not employed . Regular exercise: No History of abuse: No  Family History:   Family History  Problem Relation Age of Onset   Heart failure Mother    Hyperlipidemia Mother    Diabetes Mother    Hypertension Mother    Thyroid disease Mother    Osteoporosis Mother    Arthritis Mother    Cancer Father        Ear   Cerebrovascular Accident  Father    Melanoma Father    Parkinson's disease Father    Stroke Father    Neuropathy Father    Hyperlipidemia Sister    Asthma Sister    Hypertension Sister    Irritable bowel syndrome Sister    Cancer Paternal Uncle    Diabetes Maternal Grandmother    Arthritis Maternal Grandmother    Osteoporosis Maternal Grandmother    Heart disease Paternal Grandfather    Breast cancer Neg Hx      Review of Systems: Review of Systems  Constitutional:  Negative for fever, malaise/fatigue and weight loss.  Respiratory:  Negative for cough, shortness of breath and wheezing.   Cardiovascular:  Positive for leg swelling. Negative for chest pain and palpitations.  Gastrointestinal:  Negative for abdominal pain and blood in stool.  Genitourinary:  Negative for dysuria.  Musculoskeletal:  Negative for myalgias.  Skin:  Negative for rash.  Neurological:  Negative for dizziness and headaches.  Endo/Heme/Allergies:  Does not bruise/bleed easily.  Psychiatric/Behavioral:  Negative for depression. The patient is not nervous/anxious.      OBJECTIVE Physical Exam: Vitals:   10/29/23 1021  BP: 127/77  Pulse: 65  Weight: (!) 306 lb 6.4 oz (139 kg)  Height: 5\' 6"  (1.676 m)  Body mass index is 49.45 kg/m.   Physical Exam Vitals reviewed. Exam conducted with a chaperone present.  Constitutional:      General: She is not in acute distress.    Appearance: She is obese.  Pulmonary:     Effort: Pulmonary effort is normal.  Abdominal:     General: There is no distension.     Palpations: Abdomen is soft.     Tenderness: There is no abdominal tenderness. There is no rebound.  Musculoskeletal:        General: No swelling. Normal range of motion.  Skin:    General: Skin is warm and dry.     Findings: No rash.  Neurological:     Mental Status: She is alert and oriented to person, place, and time.  Psychiatric:        Mood and Affect: Mood normal.        Behavior: Behavior normal.      GU  / Detailed Urogynecologic Evaluation:  Pelvic Exam: Normal external female genitalia; Bartholin's and Skene's glands normal in appearance; urethral meatus normal in appearance, no urethral masses or discharge.   CST: positive  Speculum exam reveals normal vaginal mucosa with atrophy. Cervix normal appearance. Uterus normal single, nontender. Adnexa no mass, fullness, tenderness.     Pelvic floor strength I/V  Pelvic floor musculature: Right levator non-tender, Right obturator non-tender, Left levator non-tender, Left obturator non-tender  POP-Q:   POP-Q  -2  Aa   -2                                           Ba  -6                                              C   3                                            Gh  4.5                                            Pb  7                                            tvl   -1                                            Ap  -1                                            Bp                                                 D     Rectal Exam:  Normal external rectum  Post-Void Residual (PVR) by Bladder Scan: In order to evaluate bladder emptying, we discussed obtaining a postvoid residual and patient agreed to this procedure.  Procedure: The ultrasound unit was placed on the patient's abdomen in the suprapubic region after the patient had voided. PVR was with bladder scan.   Urethra was prepped with betadine and straight cath placed. PVR was only 50ml by catheter.     Post Void Residual - 10/29/23 1112       Post Void Residual   Post Void Residual 50 mL   catheter             Laboratory Results: Lab Results  Component Value Date   COLORU Yellow 10/29/2023   CLARITYU Clear 10/29/2023   GLUCOSEUR Negative 10/29/2023   BILIRUBINUR Negative 10/29/2023   KETONESU Negative 10/29/2023   SPECGRAV 1.020 10/29/2023   RBCUR Negative 10/29/2023   PHUR 5.5 10/29/2023   PROTEINUR  Negative 10/29/2023   UROBILINOGEN 0.2 10/29/2023   LEUKOCYTESUR Negative 10/29/2023    Lab Results  Component Value Date   CREATININE 0.92 10/07/2021   CREATININE 1.03 (H) 10/06/2021    No results found for: "HGBA1C"  Lab Results  Component Value Date   HGB 12.2 10/07/2021  ASSESSMENT AND PLAN Ms. Callari is a 68 y.o. with:  1. Overactive bladder   2. Urinary frequency   3. SUI (stress urinary incontinence, female)    - Has slight prolapse, but asymptomatic, will defer any treatment.   Overactive bladder Assessment & Plan: - We discussed the symptoms of overactive bladder (OAB), which include urinary urgency, urinary frequency, nocturia, with or without urge incontinence.  While we do not know the exact etiology of OAB, several treatment options exist. We discussed management including behavioral therapy (decreasing bladder irritants, urge suppression strategies, timed voids, bladder retraining), physical therapy, medication; for refractory cases posterior tibial nerve stimulation, sacral neuromodulation, and intravesical botulinum toxin injection.  - Continue with Myrbetriq 50mg .  - We discussed that her recent decrease in soda intake may positively improve her symptoms.  - She is interested in pelvic PT, referral placed.  - Can also consider the addition of pumpkin seed oil to reduce urgency.    Orders: -     AMB referral to rehabilitation  Urinary frequency -     POCT urinalysis dipstick  SUI (stress urinary incontinence, female) Assessment & Plan: - For treatment of stress urinary incontinence,  non-surgical options include expectant management, weight loss, physical therapy, as well as a pessary.  Surgical options include a midurethral sling, Burch urethropexy, and transurethral injection of a bulking agent. - We discussed that procedural treatments may not be as effective due to high BMI. Not a sling candidate at this time due to weight.  - She does not feel  that SUI is as bothersome as the OAB symptoms. Can reassess after pelvic PT.   Orders: -     AMB referral to rehabilitation  Return 3 months   Marguerita Beards, MD

## 2023-10-29 NOTE — Patient Instructions (Addendum)
 Today we talked about ways to manage bladder urgency such as altering your diet to avoid irritative beverages and foods (bladder diet) as well as attempting to decrease stress and other exacerbating factors.  You can also chew a plain Tums 1-3 times per day, or Prelief (OTC), to make your urine less acidic, especially if you have eating/drinking acidic things.    The Most Bothersome Foods* The Least Bothersome Foods*  Coffee - Regular & Decaf Tea - caffeinated Carbonated beverages - cola, non-colas, diet & caffeine-free Alcohols - Beer, Red Wine, White Wine, [address] - Grapefruit, [place], Orange, [employer] - Cranberry, Grapefruit, Orange, Pineapple Vegetables - Tomato & Tomato Products Flavor Enhancers - Hot peppers, Spicy foods, Chili, Horseradish, Vinegar, Monosodium glutamate (MSG) Artificial Sweeteners - NutraSweet, Sweet 'N Low, Equal (sweetener), Saccharin Ethnic foods - [place], [place], [place] food [employer] - low-fat & whole Fruits - Bananas, Blueberries, Honey[place]w melon, Pears, Raisins, Watermelon Vegetables - Broccoli, [address] Sprouts, [place], Carrots, Cauliflower, [place], Cucumber, Mushrooms, Peas, Radishes, Squash, Zucchini, White potatoes, Sweet potatoes & yams Poultry - Chicken, Eggs, [place], [employer] - Beef, Diplomatic Services operational officer, Lamb Seafood - Shrimp, [place] fish, Salmon Grains - Oat, Rice Snacks - Pretzels, Popcorn  *Lenward Chancellor et al. Diet and its role in interstitial cystitis/bladder pain syndrome (IC/BPS) and comorbid conditions. [employer]. [employer]. 2012 Jan 11.   You can try pumpkin seed oil to reduce bladder urgency./

## 2023-10-31 ENCOUNTER — Encounter: Payer: Self-pay | Admitting: Physical Therapy

## 2023-10-31 ENCOUNTER — Ambulatory Visit: Attending: Obstetrics and Gynecology | Admitting: Physical Therapy

## 2023-10-31 ENCOUNTER — Other Ambulatory Visit: Payer: Self-pay

## 2023-10-31 DIAGNOSIS — R279 Unspecified lack of coordination: Secondary | ICD-10-CM | POA: Insufficient documentation

## 2023-10-31 DIAGNOSIS — R293 Abnormal posture: Secondary | ICD-10-CM | POA: Diagnosis present

## 2023-10-31 DIAGNOSIS — M6281 Muscle weakness (generalized): Secondary | ICD-10-CM | POA: Insufficient documentation

## 2023-10-31 NOTE — Therapy (Signed)
 OUTPATIENT PHYSICAL THERAPY FEMALE PELVIC EVALUATION   Patient Name: HAJAR PENNINGER MRN: [id number] DOB:[birth year], 68 y.o., female Today's Date: 10/31/2023  END OF SESSION:  PT End of Session - 10/31/23 1017     Visit Number 1    Number of Visits 10    Authorization Type BCBS Medicare    PT Start Time 0930    PT Stop Time 1017    PT Time Calculation (min) 47 min    Activity Tolerance Patient tolerated treatment well    Behavior During Therapy WFL for tasks assessed/performed             Past Medical History:  Diagnosis Date   Cancer (HCC)    endometrial cancer- surgery only   GERD (gastroesophageal reflux disease)    Past Surgical History:  Procedure Laterality Date   ABDOMINAL HYSTERECTOMY     COLONOSCOPY WITH PROPOFOL N/A 06/09/2014   Procedure: COLONOSCOPY WITH PROPOFOL;  Surgeon: Florencia Reasons, MD;  Location: [facility];  Service: Endoscopy;  Laterality: N/A;   DILATION AND CURETTAGE OF UTERUS     LAPAROSCOPIC OVARIAN     diagnostic with 1 ovary removed , then Hysterectomy(endometrial cancer) with other ovary removed   TONSILLECTOMY     Patient Active Problem List   Diagnosis Date Noted   Overactive bladder 10/29/2023   SUI (stress urinary incontinence, female) 10/29/2023   Chest pain 10/07/2021    PCP: Assunta Found, MD   REFERRING PROVIDER: Marguerita Beards, MD   REFERRING DIAG: N32.81 (ICD-10-CM) - Overactive bladder N39.3 (ICD-10-CM) - SUI (stress urinary incontinence, female)  THERAPY DIAG:  Muscle weakness (generalized)  Unspecified lack of coordination  Abnormal posture  Rationale for Evaluation and Treatment: Rehabilitation  ONSET DATE: unknown   SUBJECTIVE:                                                                                                                                                                                           SUBJECTIVE STATEMENT: Patient reports to pelvic PT with urinary incontinence  that has been getting progressively worse for many years. Some days she wears 2 pads, some days she has full bladder loss and requires more frequent changes. The pads she uses usually only catch 4-5 drops of urine when she does leak. No bowel related concerns to report. No pelvic pain to report.  Fluid intake: is trying to increase her water intake, she is trying to cut back on soda/caffeine   PAIN:  Are you having pain? No NPRS scale: 0/10  PRECAUTIONS: None  RED FLAGS: None   WEIGHT BEARING RESTRICTIONS: No  FALLS:  Has patient  fallen in last 6 months? 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, is a Facilities manager   ACTIVITY LEVEL : not currently exercising consistently   PLOF: Independent  PATIENT GOALS: to decrease urinary leakage and control bladder better   PERTINENT HISTORY:  Has plantar fasciitis and she uses a cane to help her ambulate  Hx: GERD, endometrial cancer (surgery), abdominal hysterectomy, ovarian removal  Sexual abuse: No  BOWEL MOVEMENT: Pain with bowel movement: No Type of bowel movement:Type (Bristol Stool Scale) 4, Frequency 1x/day, Strain no, and Splinting no Fully empty rectum: Yes:   Leakage: No Pads: No Fiber supplement/laxative No  URINATION: Pain with urination: No Fully empty bladder: Noshe feels like she empties, but there is some residual voiding volume  Stream: Weak Urgency: Yes  Frequency: feels like she goes "all the time" - goes 1-3x/night  Leakage: Urge to void, Walking to the bathroom, Coughing, Sneezing, and Laughing Pads: Yes: long pads 4 or 5 drops   INTERCOURSE: not currently sexually active   PREGNANCY: Vaginal deliveries 0 C-section deliveries 0  PROLAPSE: None  OBJECTIVE:  Note: Objective measures were completed at Evaluation unless otherwise noted.  PATIENT SURVEYS:  PFIQ-7: 14  COGNITION: Overall cognitive status: Within functional limits for tasks assessed     SENSATION: Light touch: Appears  intact  LUMBAR SPECIAL TESTS:  Single leg stance test: Positive  FUNCTIONAL TESTS:  Squat: mild dynamic knee valgus bilaterally with sit to stand transfer   GAIT: Assistive device utilized: Single point cane Comments: moderate trendelenburg gait pattern with ambulation   POSTURE: rounded shoulders, forward head, increased thoracic kyphosis, and flexed trunk    LUMBARAROM/PROM: within functional limits   A/PROM A/PROM  eval  Flexion   Extension   Right lateral flexion   Left lateral flexion   Right rotation   Left rotation    (Blank rows = not tested)  LOWER EXTREMITY ROM: within functional limits   Active ROM Right eval Left eval  Hip flexion    Hip extension    Hip abduction    Hip adduction    Hip internal rotation    Hip external rotation    Knee flexion    Knee extension    Ankle dorsiflexion    Ankle plantarflexion    Ankle inversion    Ankle eversion     (Blank rows = not tested)  LOWER EXTREMITY MMT: 4-/5 bilateral knees and hips grossly   MMT Right eval Left eval  Hip flexion    Hip extension    Hip abduction    Hip adduction    Hip internal rotation    Hip external rotation    Knee flexion    Knee extension    Ankle dorsiflexion    Ankle plantarflexion    Ankle inversion    Ankle eversion     (Blank rows = not tested) PALPATION:   General: no significant tenderness to palpation at bilateral adductors or hip flexors   Pelvic Alignment: within normal limits   Abdominal: decreased rib excursion with inhalation, abdominal bracing at rest, upper chest breathing at rest                External Perineal Exam: mild dryness noted with sufficient clitoral hood mobility present                               Internal Pelvic Floor: Patient fully consents to today's  internal examination. She demonstrates a stiffness throughout the superficial and deep layers of the pelvic floor bilaterally, but no tenderness to palpation of superficial or deep  musculature throughout. Patient demonstrates a lack of coordination in the pelvic floor with inhalation/exhalation. Following internal cueing, patient was able to actively lengthen and shorten the pelvic floor with her breathing techniques. Repetitive pelvic floor contractions were challenging for the patient.   Patient confirms identification and approves PT to assess internal pelvic floor and treatment Yes No emotional/communication barriers or cognitive limitation. Patient is motivated to learn. Patient understands and agrees with treatment goals and plan. PT explains patient will be examined in standing, sitting, and lying down to see how their muscles and joints work. When they are ready, they will be asked to remove their underwear so PT can examine their perineum. The patient is also given the option of providing their own chaperone as one is not provided in our facility. The patient also has the right and is explained the right to defer or refuse any part of the evaluation or treatment including the internal exam. With the patient's consent, PT will use one gloved finger to gently assess the muscles of the pelvic floor, seeing how well it contracts and relaxes and if there is muscle symmetry. After, the patient will get dressed and PT and patient will discuss exam findings and plan of care. PT and patient discuss plan of care, schedule, attendance policy and HEP activities.  PELVIC MMT:   MMT eval  Vaginal 3/5 manual muscle test score, 5 quick flicks that were challenging, 0 second hold  Internal Anal Sphincter   External Anal Sphincter   Puborectalis   Diastasis Recti   (Blank rows = not tested)        TONE: Within normal limits   PROLAPSE: No significant anterior or posterior vaginal wall laxity present   TODAY'S TREATMENT:                                                                                                                              DATE:   EVAL 10/31/23: Examination  completed, findings reviewed, pt educated on POC, HEP, and self care. Pt motivated to participate in PT and agreeable to attempt recommendations.   Neuro re-ed: Hookyling diaphragmatic breathing + pelvic floor lengthening + shortening with inhalation/exhalation 2x10  Hooklying diaphragmatic breathing + pelvic floor quick flick contractions 2x10  Self care: Relative pelvic floor anatomy and the connection between the diaphragm and pelvic floor, intraabdominal pressure management and how this affects our pelvic floor musculature  PATIENT EDUCATION:  Education details: Relative pelvic floor anatomy and the connection between the diaphragm and pelvic floor, intraabdominal pressure management and how this affects our pelvic floor musculature Person educated: Patient Education method: Explanation, Demonstration, Tactile cues, Verbal cues, and Handouts Education comprehension: verbalized understanding, returned demonstration, verbal cues required, tactile cues required, and needs further education  HOME EXERCISE PROGRAM: Access Code: [email or web address] URL: [email or web address]  Date: 10/31/2023 Prepared by: Earna Coder  Exercises - Supine Pelvic Floor Contraction  - 1 x daily - 7 x weekly - 2 sets - 10 reps - Quick Flick Pelvic Floor Contractions in Hooklying  - 1 x daily - 7 x weekly - 2 sets - 10 reps  ASSESSMENT:  CLINICAL IMPRESSION: Patient is a 68 y.o. female  who was seen today for physical therapy evaluation and treatment for stress incontinence. She has been experiencing urinary incontinence for many years that has been getting progressively worse. She will go through 2+ pads per day and is waking 1-3 times per night. Patient fully consents to today's internal examination. She demonstrates a stiffness throughout the superficial and deep layers of the pelvic floor bilaterally, but no tenderness to palpation of superficial or deep musculature throughout. Patient demonstrates a lack of  coordination in the pelvic floor with inhalation/exhalation. Following internal cueing, patient was able to actively lengthen and shorten the pelvic floor with her breathing techniques. Repetitive pelvic floor contractions were challenging for the patient. No pain following today's session and Pt would benefit from additional PT to further address deficits.    OBJECTIVE IMPAIRMENTS: decreased coordination, decreased endurance, decreased mobility, decreased ROM, and decreased strength.   ACTIVITY LIMITATIONS: continence  PARTICIPATION LIMITATIONS:  N/A  PERSONAL FACTORS: Age, Past/current experiences, and Time since onset of injury/illness/exacerbation are also affecting patient's functional outcome.   REHAB POTENTIAL: Good  CLINICAL DECISION MAKING: Stable/uncomplicated  EVALUATION COMPLEXITY: Low   GOALS: Goals reviewed with patient? Yes  SHORT TERM GOALS: Target date: 11/28/2023  Pt will be independent with HEP.  Baseline: Goal status: INITIAL  2.  Pt will be independent with the knack, urge suppression technique, and double voiding in order to improve bladder habits and decrease urinary incontinence.   Baseline:  Goal status: INITIAL  3.  Pt will report no leaks with laughing, coughing, sneezing in order to improve comfort with interpersonal relationships and community activities so that she does not have to change pads multiple times per day. Baseline:  Goal status: INITIAL  LONG TERM GOALS: Target date: 05/01/2024  Pt will be independent with advanced HEP.  Baseline:  Goal status: INITIAL  2.  Pt to demonstrate improved coordination of pelvic floor and breathing mechanics with 10# squat with appropriate synergistic patterns to decrease pain and leakage at least 75% of the time.   Baseline:  Goal status: INITIAL  3.  Pt will be able to lift at least 10 lb correctly for 10 reps without pain or leakage for functional activities to allow patient to pick up weighted objects  in public without leaking urine.  Baseline:  Goal status: INITIAL  4.  Pt will demonstrate increase in all impaired hip strength by 1 muscle grades in order to demonstrate improved lumbopelvic support and increase functional ability.   Baseline:  Goal status: INITIAL  PLAN:  PT FREQUENCY: 1-2x/week  PT DURATION: 6 months  PLANNED INTERVENTIONS: 97110-Therapeutic exercises, 97530- Therapeutic activity, 97112- Neuromuscular re-education, 97535- Self Care, 82956- Manual therapy, Taping, Dry Needling, Joint mobilization, Spinal mobilization, Scar mobilization, Cryotherapy, and Moist heat  PLAN FOR NEXT SESSION: continued pelvic floor AROM training in seated, introduce hip and core strengthening, introduce downtraining stretches   Omar Person, PT 10/31/2023, 10:40 AM

## 2023-10-31 NOTE — Patient Instructions (Signed)
 Start cutting off fluids 1 hour before bedtime to help with nighttime waking to void

## 2023-11-04 ENCOUNTER — Ambulatory Visit: Admitting: Physical Therapy

## 2023-11-06 ENCOUNTER — Ambulatory Visit: Admitting: Physical Therapy

## 2023-11-06 DIAGNOSIS — M6281 Muscle weakness (generalized): Secondary | ICD-10-CM

## 2023-11-06 DIAGNOSIS — R279 Unspecified lack of coordination: Secondary | ICD-10-CM

## 2023-11-06 DIAGNOSIS — R293 Abnormal posture: Secondary | ICD-10-CM

## 2023-11-06 NOTE — Therapy (Signed)
 OUTPATIENT PHYSICAL THERAPY FEMALE PELVIC TREATMENT   Patient Name: Chelsea Bell MRN: [id number] DOB:03/28/1956, 68 y.o., female Today's Date: 11/06/2023  END OF SESSION:  PT End of Session - 11/06/23 1702     Visit Number 2    Number of Visits 10    Authorization Type [employer] Medicare    PT Start Time 0330    PT Stop Time 0415    PT Time Calculation (min) 45 min    Activity Tolerance Patient tolerated treatment well    Behavior During Therapy WFL for tasks assessed/performed              Past Medical History:  Diagnosis Date   Cancer (HCC)    endometrial cancer- surgery only   GERD (gastroesophageal reflux disease)    Past Surgical History:  Procedure Laterality Date   ABDOMINAL HYSTERECTOMY     COLONOSCOPY WITH PROPOFOL N/A 06/09/2014   Procedure: COLONOSCOPY WITH PROPOFOL;  Surgeon: Brice Campi, MD;  Location: [facility];  Service: Endoscopy;  Laterality: N/A;   DILATION AND CURETTAGE OF UTERUS     LAPAROSCOPIC OVARIAN     diagnostic with 1 ovary removed , then Hysterectomy(endometrial cancer) with other ovary removed   TONSILLECTOMY     Patient Active Problem List   Diagnosis Date Noted   Overactive bladder 10/29/2023   SUI (stress urinary incontinence, female) 10/29/2023   Chest pain 10/07/2021    PCP: Minus Amel, MD   REFERRING PROVIDER: Arma Lamp, MD   REFERRING DIAG: N32.81 (ICD-10-CM) - Overactive bladder N39.3 (ICD-10-CM) - SUI (stress urinary incontinence, female)  THERAPY DIAG:  Muscle weakness (generalized)  Abnormal posture  Unspecified lack of coordination  Rationale for Evaluation and Treatment: Rehabilitation  ONSET DATE: unknown   SUBJECTIVE:                                                                                                                                                                                           SUBJECTIVE STATEMENT: "Chelsea Bell" Patient reports that she has been wearing 1 pad  most days. This morning she leaked on the way to the bathroom. She has not been soaking pads through the night.  Eval: Patient reports to pelvic PT with urinary incontinence that has been getting progressively worse for many years. Some days she wears 2 pads, some days she has full bladder loss and requires more frequent changes. The pads she uses usually only catch 4-5 drops of urine when she does leak. No bowel related concerns to report. No pelvic pain to report.  Fluid intake: is trying to increase her water intake, she is trying to  cut back on soda/caffeine   PAIN:  Are you having pain? No NPRS scale: 0/10  PRECAUTIONS: None  RED FLAGS: None   WEIGHT BEARING RESTRICTIONS: No  FALLS:  Has patient fallen in last 6 months? 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, is a Facilities manager   ACTIVITY LEVEL : not currently exercising consistently   PLOF: Independent  PATIENT GOALS: to decrease urinary leakage and control bladder better   PERTINENT HISTORY:  Has plantar fasciitis and she uses a cane to help her ambulate  Hx: GERD, endometrial cancer (surgery), abdominal hysterectomy, ovarian removal  Sexual abuse: No  BOWEL MOVEMENT: Pain with bowel movement: No Type of bowel movement:Type (Bristol Stool Scale) 4, Frequency 1x/day, Strain no, and Splinting no Fully empty rectum: Yes:   Leakage: No Pads: No Fiber supplement/laxative No  URINATION: Pain with urination: No Fully empty bladder: Noshe feels like she empties, but there is some residual voiding volume  Stream: Weak Urgency: Yes  Frequency: feels like she goes "all the time" - goes 1-3x/night  Leakage: Urge to void, Walking to the bathroom, Coughing, Sneezing, and Laughing Pads: Yes: long pads 4 or 5 drops   INTERCOURSE: not currently sexually active   PREGNANCY: Vaginal deliveries 0 C-section deliveries 0  PROLAPSE: None  OBJECTIVE:  Note: Objective measures were completed at Evaluation  unless otherwise noted.  PATIENT SURVEYS:  PFIQ-7: 14  COGNITION: Overall cognitive status: Within functional limits for tasks assessed     SENSATION: Light touch: Appears intact  LUMBAR SPECIAL TESTS:  Single leg stance test: Positive  FUNCTIONAL TESTS:  Squat: mild dynamic knee valgus bilaterally with sit to stand transfer   GAIT: Assistive device utilized: Single point cane Comments: moderate trendelenburg gait pattern with ambulation   POSTURE: rounded shoulders, forward head, increased thoracic kyphosis, and flexed trunk    LUMBARAROM/PROM: within functional limits   A/PROM A/PROM  eval  Flexion   Extension   Right lateral flexion   Left lateral flexion   Right rotation   Left rotation    (Blank rows = not tested)  LOWER EXTREMITY ROM: within functional limits   Active ROM Right eval Left eval  Hip flexion    Hip extension    Hip abduction    Hip adduction    Hip internal rotation    Hip external rotation    Knee flexion    Knee extension    Ankle dorsiflexion    Ankle plantarflexion    Ankle inversion    Ankle eversion     (Blank rows = not tested)  LOWER EXTREMITY MMT: 4-/5 bilateral knees and hips grossly   MMT Right eval Left eval  Hip flexion    Hip extension    Hip abduction    Hip adduction    Hip internal rotation    Hip external rotation    Knee flexion    Knee extension    Ankle dorsiflexion    Ankle plantarflexion    Ankle inversion    Ankle eversion     (Blank rows = not tested) PALPATION:   General: no significant tenderness to palpation at bilateral adductors or hip flexors   Pelvic Alignment: within normal limits   Abdominal: decreased rib excursion with inhalation, abdominal bracing at rest, upper chest breathing at rest                External Perineal Exam: mild dryness noted with sufficient clitoral hood mobility present  Internal Pelvic Floor: Patient fully consents to today's  internal examination. She demonstrates a stiffness throughout the superficial and deep layers of the pelvic floor bilaterally, but no tenderness to palpation of superficial or deep musculature throughout. Patient demonstrates a lack of coordination in the pelvic floor with inhalation/exhalation. Following internal cueing, patient was able to actively lengthen and shorten the pelvic floor with her breathing techniques. Repetitive pelvic floor contractions were challenging for the patient.   Patient confirms identification and approves PT to assess internal pelvic floor and treatment Yes No emotional/communication barriers or cognitive limitation. Patient is motivated to learn. Patient understands and agrees with treatment goals and plan. PT explains patient will be examined in standing, sitting, and lying down to see how their muscles and joints work. When they are ready, they will be asked to remove their underwear so PT can examine their perineum. The patient is also given the option of providing their own chaperone as one is not provided in our facility. The patient also has the right and is explained the right to defer or refuse any part of the evaluation or treatment including the internal exam. With the patient's consent, PT will use one gloved finger to gently assess the muscles of the pelvic floor, seeing how well it contracts and relaxes and if there is muscle symmetry. After, the patient will get dressed and PT and patient will discuss exam findings and plan of care. PT and patient discuss plan of care, schedule, attendance policy and HEP activities.  PELVIC MMT:   MMT eval  Vaginal 3/5 manual muscle test score, 5 quick flicks that were challenging, 0 second hold  Internal Anal Sphincter   External Anal Sphincter   Puborectalis   Diastasis Recti   (Blank rows = not tested)        TONE: Within normal limits   PROLAPSE: No significant anterior or posterior vaginal wall laxity present    TODAY'S TREATMENT:                                                                                                                              DATE:   EVAL 10/31/23: Examination completed, findings reviewed, pt educated on POC, HEP, and self care. Pt motivated to participate in PT and agreeable to attempt recommendations.   Neuro re-ed: Hookyling diaphragmatic breathing + pelvic floor lengthening + shortening with inhalation/exhalation 2x10  Hooklying diaphragmatic breathing + pelvic floor quick flick contractions 2x10  Self care: Relative pelvic floor anatomy and the connection between the diaphragm and pelvic floor, intraabdominal pressure management and how this affects our pelvic floor musculature  11/06/23: Neuro re-ed: SEATED diaphragmatic breathing + pelvic floor lengthening + shortening with inhalation/exhalation 2x10  Hooklying diaphragmatic breathing + pelvic floor quick flick contractions 2x10  Manual therapy: Internal cueing for pelvic floor lengthening with inhalation and shortening with exhalation  Internal cueing for quick flick pelvic floor contractions as she exhales  Self care: Relative pelvic floor  anatomy and the connection between the diaphragm and pelvic floor, intraabdominal pressure management and how this affects our pelvic floor musculature Knack technique   PATIENT EDUCATION:  Education details: Relative pelvic floor anatomy and the connection between the diaphragm and pelvic floor, intraabdominal pressure management and how this affects our pelvic floor musculature Person educated: Patient Education method: Explanation, Demonstration, Tactile cues, Verbal cues, and Handouts Education comprehension: verbalized understanding, returned demonstration, verbal cues required, tactile cues required, and needs further education  HOME EXERCISE PROGRAM: Access Code: [email or web address] URL: [email or web address] Date: 11/06/2023 Prepared by: Robbin Chill  Exercises - Quick Flick Pelvic Floor Contractions in Hooklying  - 1 x daily - 7 x weekly - 2 sets - 10 reps - Seated Pelvic Floor Contraction  - 1 x daily - 7 x weekly - 2 sets - 10 reps  ASSESSMENT:  CLINICAL IMPRESSION: Patient is a 68 y.o. female  who was seen today for physical therapy treatment for stress incontinence.Patient is noticing less urinary leakage throughout the day and at night, but she still soaked a pad this morning when getting out of bed. Internal treatment performed to assess pelvic floor active range of motion and to ensure patient was doing exercises correctly. She demonstrates improved pelvic floor coordination and control compared to last session. No pain following today's session and Pt would benefit from additional PT to further address deficits.    OBJECTIVE IMPAIRMENTS: decreased coordination, decreased endurance, decreased mobility, decreased ROM, and decreased strength.   ACTIVITY LIMITATIONS: continence  PARTICIPATION LIMITATIONS:  N/A  PERSONAL FACTORS: Age, Past/current experiences, and Time since onset of injury/illness/exacerbation are also affecting patient's functional outcome.   REHAB POTENTIAL: Good  CLINICAL DECISION MAKING: Stable/uncomplicated  EVALUATION COMPLEXITY: Low   GOALS: Goals reviewed with patient? Yes  SHORT TERM GOALS: Target date: 11/28/2023  Pt will be independent with HEP.  Baseline: Goal status: INITIAL  2.  Pt will be independent with the knack, urge suppression technique, and double voiding in order to improve bladder habits and decrease urinary incontinence.   Baseline:  Goal status: INITIAL  3.  Pt will report no leaks with laughing, coughing, sneezing in order to improve comfort with interpersonal relationships and community activities so that she does not have to change pads multiple times per day. Baseline:  Goal status: INITIAL  LONG TERM GOALS: Target date: 05/01/2024  Pt will be independent with  advanced HEP.  Baseline:  Goal status: INITIAL  2.  Pt to demonstrate improved coordination of pelvic floor and breathing mechanics with 10# squat with appropriate synergistic patterns to decrease pain and leakage at least 75% of the time.   Baseline:  Goal status: INITIAL  3.  Pt will be able to lift at least 10 lb correctly for 10 reps without pain or leakage for functional activities to allow patient to pick up weighted objects in public without leaking urine.  Baseline:  Goal status: INITIAL  4.  Pt will demonstrate increase in all impaired hip strength by 1 muscle grades in order to demonstrate improved lumbopelvic support and increase functional ability.   Baseline:  Goal status: INITIAL  PLAN:  PT FREQUENCY: 1-2x/week  PT DURATION: 6 months  PLANNED INTERVENTIONS: 97110-Therapeutic exercises, 97530- Therapeutic activity, 97112- Neuromuscular re-education, 97535- Self Care, [id number]- Manual therapy, Taping, Dry Needling, Joint mobilization, Spinal mobilization, Scar mobilization, Cryotherapy, and Moist heat  PLAN FOR NEXT SESSION: continued pelvic floor AROM training in seated, introduce hip and core strengthening, introduce downtraining stretches   Granite Godman C  Zylie Mumaw, PT 11/06/2023, 5:02 PM

## 2023-11-13 ENCOUNTER — Encounter: Payer: Self-pay | Admitting: Physical Therapy

## 2023-11-19 ENCOUNTER — Ambulatory Visit: Payer: Self-pay | Admitting: Physical Therapy

## 2023-11-26 ENCOUNTER — Ambulatory Visit: Payer: Self-pay | Attending: Obstetrics and Gynecology | Admitting: Physical Therapy

## 2023-11-26 DIAGNOSIS — R279 Unspecified lack of coordination: Secondary | ICD-10-CM | POA: Diagnosis present

## 2023-11-26 DIAGNOSIS — M6281 Muscle weakness (generalized): Secondary | ICD-10-CM | POA: Insufficient documentation

## 2023-11-26 DIAGNOSIS — R293 Abnormal posture: Secondary | ICD-10-CM | POA: Insufficient documentation

## 2023-11-26 NOTE — Therapy (Signed)
 OUTPATIENT PHYSICAL THERAPY FEMALE PELVIC TREATMENT   Patient Name: Chelsea Bell MRN: [id number] DOB:[birth year], 68 y.o., female Today's Date: 11/26/2023  END OF SESSION:  PT End of Session - 11/26/23 1301     Visit Number 3    Number of Visits 10    Authorization Type [employer] Medicare    PT Start Time 1145    PT Stop Time 1230    PT Time Calculation (min) 45 min    Activity Tolerance Patient tolerated treatment well    Behavior During Therapy WFL for tasks assessed/performed               Past Medical History:  Diagnosis Date   Cancer (HCC)    endometrial cancer- surgery only   GERD (gastroesophageal reflux disease)    Past Surgical History:  Procedure Laterality Date   ABDOMINAL HYSTERECTOMY     COLONOSCOPY WITH PROPOFOL  N/A 06/09/2014   Procedure: COLONOSCOPY WITH PROPOFOL ;  Surgeon: Brice Campi, MD;  Location: [facility];  Service: Endoscopy;  Laterality: N/A;   DILATION AND CURETTAGE OF UTERUS     LAPAROSCOPIC OVARIAN     diagnostic with 1 ovary removed , then Hysterectomy(endometrial cancer) with other ovary removed   TONSILLECTOMY     Patient Active Problem List   Diagnosis Date Noted   Overactive bladder 10/29/2023   SUI (stress urinary incontinence, female) 10/29/2023   Chest pain 10/07/2021    PCP: Minus Amel, MD   REFERRING PROVIDER: Arma Lamp, MD   REFERRING DIAG: N32.81 (ICD-10-CM) - Overactive bladder N39.3 (ICD-10-CM) - SUI (stress urinary incontinence, female)  THERAPY DIAG:  Muscle weakness (generalized)  Abnormal posture  Unspecified lack of coordination  Rationale for Evaluation and Treatment: Rehabilitation  ONSET DATE: unknown   SUBJECTIVE:                                                                                                                                                                                           SUBJECTIVE STATEMENT: "kathy" Patient reports that she has had two episodes  of fecal urgency that almost led to incontinence. Her urinary control has been going better - she had one morning this week when she had some urine running down her leg when she stood up from the bed and walked. No urinary leakage in the night when she is sleeping.   Eval: Patient reports to pelvic PT with urinary incontinence that has been getting progressively worse for many years. Some days she wears 2 pads, some days she has full bladder loss and requires more frequent changes. The pads she uses usually only catch 4-5 drops of  urine when she does leak. No bowel related concerns to report. No pelvic pain to report.  Fluid intake: is trying to increase her water intake, she is trying to cut back on soda/caffeine   PAIN:  Are you having pain? No NPRS scale: 0/10  PRECAUTIONS: None  RED FLAGS: None   WEIGHT BEARING RESTRICTIONS: No  FALLS:  Has patient fallen in last 6 months? 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, is a Facilities manager   ACTIVITY LEVEL : not currently exercising consistently   PLOF: Independent  PATIENT GOALS: to decrease urinary leakage and control bladder better   PERTINENT HISTORY:  Has plantar fasciitis and she uses a cane to help her ambulate  Hx: GERD, endometrial cancer (surgery), abdominal hysterectomy, ovarian removal  Sexual abuse: No  BOWEL MOVEMENT: Pain with bowel movement: No Type of bowel movement:Type (Bristol Stool Scale) 4, Frequency 1x/day, Strain no, and Splinting no Fully empty rectum: Yes:   Leakage: No Pads: No Fiber supplement/laxative No  URINATION: Pain with urination: No Fully empty bladder: Noshe feels like she empties, but there is some residual voiding volume  Stream: Weak Urgency: Yes  Frequency: feels like she goes "all the time" - goes 1-3x/night  Leakage: Urge to void, Walking to the bathroom, Coughing, Sneezing, and Laughing Pads: Yes: long pads 4 or 5 drops   INTERCOURSE: not currently sexually active    PREGNANCY: Vaginal deliveries 0 C-section deliveries 0  PROLAPSE: None  OBJECTIVE:  Note: Objective measures were completed at Evaluation unless otherwise noted.  PATIENT SURVEYS:  PFIQ-7: 14  COGNITION: Overall cognitive status: Within functional limits for tasks assessed     SENSATION: Light touch: Appears intact  LUMBAR SPECIAL TESTS:  Single leg stance test: Positive  FUNCTIONAL TESTS:  Squat: mild dynamic knee valgus bilaterally with sit to stand transfer   GAIT: Assistive device utilized: Single point cane Comments: moderate trendelenburg gait pattern with ambulation   POSTURE: rounded shoulders, forward head, increased thoracic kyphosis, and flexed trunk    LUMBARAROM/PROM: within functional limits   A/PROM A/PROM  eval  Flexion   Extension   Right lateral flexion   Left lateral flexion   Right rotation   Left rotation    (Blank rows = not tested)  LOWER EXTREMITY ROM: within functional limits   Active ROM Right eval Left eval  Hip flexion    Hip extension    Hip abduction    Hip adduction    Hip internal rotation    Hip external rotation    Knee flexion    Knee extension    Ankle dorsiflexion    Ankle plantarflexion    Ankle inversion    Ankle eversion     (Blank rows = not tested)  LOWER EXTREMITY MMT: 4-/5 bilateral knees and hips grossly   MMT Right eval Left eval  Hip flexion    Hip extension    Hip abduction    Hip adduction    Hip internal rotation    Hip external rotation    Knee flexion    Knee extension    Ankle dorsiflexion    Ankle plantarflexion    Ankle inversion    Ankle eversion     (Blank rows = not tested) PALPATION:   General: no significant tenderness to palpation at bilateral adductors or hip flexors   Pelvic Alignment: within normal limits   Abdominal: decreased rib excursion with inhalation, abdominal bracing at rest, upper chest breathing at rest  External Perineal Exam: mild  dryness noted with sufficient clitoral hood mobility present                               Internal Pelvic Floor: Patient fully consents to today's internal examination. She demonstrates a stiffness throughout the superficial and deep layers of the pelvic floor bilaterally, but no tenderness to palpation of superficial or deep musculature throughout. Patient demonstrates a lack of coordination in the pelvic floor with inhalation/exhalation. Following internal cueing, patient was able to actively lengthen and shorten the pelvic floor with her breathing techniques. Repetitive pelvic floor contractions were challenging for the patient.   Patient confirms identification and approves PT to assess internal pelvic floor and treatment Yes No emotional/communication barriers or cognitive limitation. Patient is motivated to learn. Patient understands and agrees with treatment goals and plan. PT explains patient will be examined in standing, sitting, and lying down to see how their muscles and joints work. When they are ready, they will be asked to remove their underwear so PT can examine their perineum. The patient is also given the option of providing their own chaperone as one is not provided in our facility. The patient also has the right and is explained the right to defer or refuse any part of the evaluation or treatment including the internal exam. With the patient's consent, PT will use one gloved finger to gently assess the muscles of the pelvic floor, seeing how well it contracts and relaxes and if there is muscle symmetry. After, the patient will get dressed and PT and patient will discuss exam findings and plan of care. PT and patient discuss plan of care, schedule, attendance policy and HEP activities.  PELVIC MMT:   MMT eval  Vaginal 3/5 manual muscle test score, 5 quick flicks that were challenging, 0 second hold  Internal Anal Sphincter   External Anal Sphincter   Puborectalis   Diastasis Recti    (Blank rows = not tested)        TONE: Within normal limits   PROLAPSE: No significant anterior or posterior vaginal wall laxity present   TODAY'S TREATMENT:                                                                                                                              DATE:   EVAL 10/31/23: Examination completed, findings reviewed, pt educated on POC, HEP, and self care. Pt motivated to participate in PT and agreeable to attempt recommendations.   Neuro re-ed: Hookyling diaphragmatic breathing + pelvic floor lengthening + shortening with inhalation/exhalation 2x10  Hooklying diaphragmatic breathing + pelvic floor quick flick contractions 2x10  Self care: Relative pelvic floor anatomy and the connection between the diaphragm and pelvic floor, intraabdominal pressure management and how this affects our pelvic floor musculature  11/06/23: Neuro re-ed: SEATED diaphragmatic breathing + pelvic floor lengthening + shortening with inhalation/exhalation 2x10  Hooklying  diaphragmatic breathing + pelvic floor quick flick contractions 2x10  Manual therapy: Internal cueing for pelvic floor lengthening with inhalation and shortening with exhalation  Internal cueing for quick flick pelvic floor contractions as she exhales  Self care: Relative pelvic floor anatomy and the connection between the diaphragm and pelvic floor, intraabdominal pressure management and how this affects our pelvic floor musculature Knack technique   11/26/23: Neuro re-ed: SEATED diaphragmatic breathing + pelvic floor lengthening + shortening with inhalation/exhalation 2x10  Standing diaphragmatic breathing + pelvic floor contraction on exhale 2x5 When transferring: exhale when standing, perform a pelvic contraction when standing before walking to bathroom Blow as you go technique for regulating intraabdominal pressure: when standing, transferring, or doing strenuous activity  Self care: Relative pelvic floor  anatomy and the connection between the diaphragm and pelvic floor, intraabdominal pressure management and how this affects our pelvic floor musculature Knack technique for stress urinary incontinence  Toileting mechanics and techniques to fully empty rectum when passing bowel movements   PATIENT EDUCATION:  Education details: Relative pelvic floor anatomy and the connection between the diaphragm and pelvic floor, intraabdominal pressure management and how this affects our pelvic floor musculature Person educated: Patient Education method: Explanation, Demonstration, Tactile cues, Verbal cues, and Handouts Education comprehension: verbalized understanding, returned demonstration, verbal cues required, tactile cues required, and needs further education  HOME EXERCISE PROGRAM: Access Code: [email or web address] URL: [email or web address] Date: 11/06/2023 Prepared by: Robbin Chill  Exercises - Quick Flick Pelvic Floor Contractions in Hooklying  - 1 x daily - 7 x weekly - 2 sets - 10 reps - Seated Pelvic Floor Contraction  - 1 x daily - 7 x weekly - 2 sets - 10 reps  ASSESSMENT:  CLINICAL IMPRESSION: Patient is a 68 y.o. female  who was seen today for physical therapy treatment for stress incontinence. Patient's urinary control is slowly improving, however she still leaks with transfers in the morning with a full bladder. We reviewed how to breathe and exercises to perform in the morning when this happens to help prevent leakage before getting to toilet. Bowel techniques were taught today to help patient fully empty when passing bowel movements. Blow as you go technique introduced to decrease strain on pelvic floor with transfers. No pain following today's session and Pt would benefit from additional PT to further address deficits.    OBJECTIVE IMPAIRMENTS: decreased coordination, decreased endurance, decreased mobility, decreased ROM, and decreased strength.   ACTIVITY LIMITATIONS:  continence  PARTICIPATION LIMITATIONS:  N/A  PERSONAL FACTORS: Age, Past/current experiences, and Time since onset of injury/illness/exacerbation are also affecting patient's functional outcome.   REHAB POTENTIAL: Good  CLINICAL DECISION MAKING: Stable/uncomplicated  EVALUATION COMPLEXITY: Low   GOALS: Goals reviewed with patient? Yes  SHORT TERM GOALS: Target date: 11/28/2023  Pt will be independent with HEP.  Baseline: Goal status: INITIAL  2.  Pt will be independent with the knack, urge suppression technique, and double voiding in order to improve bladder habits and decrease urinary incontinence.   Baseline:  Goal status: INITIAL  3.  Pt will report no leaks with laughing, coughing, sneezing in order to improve comfort with interpersonal relationships and community activities so that she does not have to change pads multiple times per day. Baseline:  Goal status: INITIAL  LONG TERM GOALS: Target date: 05/01/2024  Pt will be independent with advanced HEP.  Baseline:  Goal status: INITIAL  2.  Pt to demonstrate improved coordination of pelvic floor and breathing mechanics with 10# squat with appropriate  synergistic patterns to decrease pain and leakage at least 75% of the time.   Baseline:  Goal status: INITIAL  3.  Pt will be able to lift at least 10 lb correctly for 10 reps without pain or leakage for functional activities to allow patient to pick up weighted objects in public without leaking urine.  Baseline:  Goal status: INITIAL  4.  Pt will demonstrate increase in all impaired hip strength by 1 muscle grades in order to demonstrate improved lumbopelvic support and increase functional ability.   Baseline:  Goal status: INITIAL  PLAN:  PT FREQUENCY: 1-2x/week  PT DURATION: 6 months  PLANNED INTERVENTIONS: 97110-Therapeutic exercises, 97530- Therapeutic activity, 97112- Neuromuscular re-education, 97535- Self Care, 16109- Manual therapy, Taping, Dry Needling,  Joint mobilization, Spinal mobilization, Scar mobilization, Cryotherapy, and Moist heat  PLAN FOR NEXT SESSION: continued pelvic floor AROM training in seated, introduce hip and core strengthening, introduce downtraining stretches   Marni Sins, PT 11/26/2023, 1:01 PM

## 2023-11-26 NOTE — Patient Instructions (Signed)
 When you are about to have a bowel movement, there are a few things you can do to ensure that you fully empty your rectum when you go. Try the following: Sit all the way down on the toilet whether you are voiding or defecating  Use a foot stool or a squatty potty underneath your feet when you sit down. This will help you with avoiding straining and fully emptying.  When you feel the need to strain, push gently as you exhale into your hand, imagining that you are fogging up a mirror with your breath   When you wake up in the morning, you are going to sit up on the egde of your bed and do the following:  Sit with your hands on your stomach. As you inhale, imagine your stomach expanding like a balloon, and your pelvic floor does nothing, as you exhale, do a kegel contraction while you blow out. Do this 2-3 times before you stand up, making sure that your urinary urgency is under control.  Stand up from the bed as you BLOW OUT. No kegels here. While standing, perform 3 more breaths with kegels on exhale. Once you have control of your urgency, walk slowly to the bathroom.   Every time you stand up from a seated surface, try the following: Tura Gaines out as you stand, and if you can, do a vagina squeeze when you are on the way up. If you cannot do the squeeze while moving, wait until you are fulling standing, and then squeeze.

## 2023-12-02 ENCOUNTER — Ambulatory Visit: Admitting: Physical Therapy

## 2023-12-03 ENCOUNTER — Ambulatory Visit: Payer: Self-pay | Admitting: Physical Therapy

## 2023-12-03 DIAGNOSIS — M6281 Muscle weakness (generalized): Secondary | ICD-10-CM | POA: Diagnosis not present

## 2023-12-03 DIAGNOSIS — R279 Unspecified lack of coordination: Secondary | ICD-10-CM

## 2023-12-03 DIAGNOSIS — R293 Abnormal posture: Secondary | ICD-10-CM

## 2023-12-03 NOTE — Therapy (Signed)
 OUTPATIENT PHYSICAL THERAPY FEMALE PELVIC TREATMENT   Patient Name: Chelsea Bell MRN: [id number] DOB:May 25, 1956, 68 y.o., female Today's Date: 12/03/2023  END OF SESSION:  PT End of Session - 12/03/23 1050     Visit Number 4    Number of Visits 10    Authorization Type [employer] Medicare    PT Start Time 1015    PT Stop Time 1100    PT Time Calculation (min) 45 min    Activity Tolerance Patient tolerated treatment well    Behavior During Therapy WFL for tasks assessed/performed                Past Medical History:  Diagnosis Date   Cancer (HCC)    endometrial cancer- surgery only   GERD (gastroesophageal reflux disease)    Past Surgical History:  Procedure Laterality Date   ABDOMINAL HYSTERECTOMY     COLONOSCOPY WITH PROPOFOL  N/A 06/09/2014   Procedure: COLONOSCOPY WITH PROPOFOL ;  Surgeon: Brice Campi, MD;  Location: [facility];  Service: Endoscopy;  Laterality: N/A;   DILATION AND CURETTAGE OF UTERUS     LAPAROSCOPIC OVARIAN     diagnostic with 1 ovary removed , then Hysterectomy(endometrial cancer) with other ovary removed   TONSILLECTOMY     Patient Active Problem List   Diagnosis Date Noted   Overactive bladder 10/29/2023   SUI (stress urinary incontinence, female) 10/29/2023   Chest pain 10/07/2021    PCP: Minus Amel, MD   REFERRING PROVIDER: Arma Lamp, MD   REFERRING DIAG: N32.81 (ICD-10-CM) - Overactive bladder N39.3 (ICD-10-CM) - SUI (stress urinary incontinence, female)  THERAPY DIAG:  Muscle weakness (generalized)  Abnormal posture  Unspecified lack of coordination  Rationale for Evaluation and Treatment: Rehabilitation  ONSET DATE: unknown   SUBJECTIVE:                                                                                                                                                                                           SUBJECTIVE STATEMENT: "kathy" Patient reports that she had a small fall  while trying to clean her toilet at home, this fall was not hard. She saw her chiropractor after this and it helped. Fecal urgency is better with use of squatty potty. She has struggled to do her exercises this week as consistently. No instances of urine running down her leg when she walks to the bathroom now. On Friday, she had a lot of urinary incontinence throughout the day, unsure why. She is no longer having to use her big pads, only using smaller ones, which she is pleased with.   Eval: Patient reports to pelvic  PT with urinary incontinence that has been getting progressively worse for many years. Some days she wears 2 pads, some days she has full bladder loss and requires more frequent changes. The pads she uses usually only catch 4-5 drops of urine when she does leak. No bowel related concerns to report. No pelvic pain to report.  Fluid intake: is trying to increase her water intake, she is trying to cut back on soda/caffeine   PAIN:  Are you having pain? No NPRS scale: 0/10  PRECAUTIONS: None  RED FLAGS: None   WEIGHT BEARING RESTRICTIONS: No  FALLS:  Has patient fallen in last 6 months? 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, is a Facilities manager   ACTIVITY LEVEL : not currently exercising consistently   PLOF: Independent  PATIENT GOALS: to decrease urinary leakage and control bladder better   PERTINENT HISTORY:  Has plantar fasciitis and she uses a cane to help her ambulate  Hx: GERD, endometrial cancer (surgery), abdominal hysterectomy, ovarian removal  Sexual abuse: No  BOWEL MOVEMENT: Pain with bowel movement: No Type of bowel movement:Type (Bristol Stool Scale) 4, Frequency 1x/day, Strain no, and Splinting no Fully empty rectum: Yes:   Leakage: No Pads: No Fiber supplement/laxative No  URINATION: Pain with urination: No Fully empty bladder: Noshe feels like she empties, but there is some residual voiding volume  Stream: Weak Urgency: Yes   Frequency: feels like she goes "all the time" - goes 1-3x/night  Leakage: Urge to void, Walking to the bathroom, Coughing, Sneezing, and Laughing Pads: Yes: long pads 4 or 5 drops   INTERCOURSE: not currently sexually active   PREGNANCY: Vaginal deliveries 0 C-section deliveries 0  PROLAPSE: None  OBJECTIVE:  Note: Objective measures were completed at Evaluation unless otherwise noted.  PATIENT SURVEYS:  PFIQ-7: 14  COGNITION: Overall cognitive status: Within functional limits for tasks assessed     SENSATION: Light touch: Appears intact  LUMBAR SPECIAL TESTS:  Single leg stance test: Positive  FUNCTIONAL TESTS:  Squat: mild dynamic knee valgus bilaterally with sit to stand transfer   GAIT: Assistive device utilized: Single point cane Comments: moderate trendelenburg gait pattern with ambulation   POSTURE: rounded shoulders, forward head, increased thoracic kyphosis, and flexed trunk    LUMBARAROM/PROM: within functional limits   A/PROM A/PROM  eval  Flexion   Extension   Right lateral flexion   Left lateral flexion   Right rotation   Left rotation    (Blank rows = not tested)  LOWER EXTREMITY ROM: within functional limits   Active ROM Right eval Left eval  Hip flexion    Hip extension    Hip abduction    Hip adduction    Hip internal rotation    Hip external rotation    Knee flexion    Knee extension    Ankle dorsiflexion    Ankle plantarflexion    Ankle inversion    Ankle eversion     (Blank rows = not tested)  LOWER EXTREMITY MMT: 4-/5 bilateral knees and hips grossly   MMT Right eval Left eval  Hip flexion    Hip extension    Hip abduction    Hip adduction    Hip internal rotation    Hip external rotation    Knee flexion    Knee extension    Ankle dorsiflexion    Ankle plantarflexion    Ankle inversion    Ankle eversion     (Blank rows = not  tested) PALPATION:   General: no significant tenderness to palpation at bilateral  adductors or hip flexors   Pelvic Alignment: within normal limits   Abdominal: decreased rib excursion with inhalation, abdominal bracing at rest, upper chest breathing at rest                External Perineal Exam: mild dryness noted with sufficient clitoral hood mobility present                               Internal Pelvic Floor: Patient fully consents to today's internal examination. She demonstrates a stiffness throughout the superficial and deep layers of the pelvic floor bilaterally, but no tenderness to palpation of superficial or deep musculature throughout. Patient demonstrates a lack of coordination in the pelvic floor with inhalation/exhalation. Following internal cueing, patient was able to actively lengthen and shorten the pelvic floor with her breathing techniques. Repetitive pelvic floor contractions were challenging for the patient.   Patient confirms identification and approves PT to assess internal pelvic floor and treatment Yes No emotional/communication barriers or cognitive limitation. Patient is motivated to learn. Patient understands and agrees with treatment goals and plan. PT explains patient will be examined in standing, sitting, and lying down to see how their muscles and joints work. When they are ready, they will be asked to remove their underwear so PT can examine their perineum. The patient is also given the option of providing their own chaperone as one is not provided in our facility. The patient also has the right and is explained the right to defer or refuse any part of the evaluation or treatment including the internal exam. With the patient's consent, PT will use one gloved finger to gently assess the muscles of the pelvic floor, seeing how well it contracts and relaxes and if there is muscle symmetry. After, the patient will get dressed and PT and patient will discuss exam findings and plan of care. PT and patient discuss plan of care, schedule, attendance policy and  HEP activities.  PELVIC MMT:   MMT eval  Vaginal 3/5 manual muscle test score, 5 quick flicks that were challenging, 0 second hold  Internal Anal Sphincter   External Anal Sphincter   Puborectalis   Diastasis Recti   (Blank rows = not tested)        TONE: Within normal limits   PROLAPSE: No significant anterior or posterior vaginal wall laxity present   TODAY'S TREATMENT:                                                                                                                              DATE:   EVAL 10/31/23: Examination completed, findings reviewed, pt educated on POC, HEP, and self care. Pt motivated to participate in PT and agreeable to attempt recommendations.   Neuro re-ed: Hookyling diaphragmatic breathing + pelvic floor lengthening + shortening with inhalation/exhalation 2x10  Hooklying diaphragmatic breathing + pelvic floor quick flick contractions 2x10  Self care: Relative pelvic floor anatomy and the connection between the diaphragm and pelvic floor, intraabdominal pressure management and how this affects our pelvic floor musculature  11/06/23: Neuro re-ed: SEATED diaphragmatic breathing + pelvic floor lengthening + shortening with inhalation/exhalation 2x10  Hooklying diaphragmatic breathing + pelvic floor quick flick contractions 2x10  Manual therapy: Internal cueing for pelvic floor lengthening with inhalation and shortening with exhalation  Internal cueing for quick flick pelvic floor contractions as she exhales  Self care: Relative pelvic floor anatomy and the connection between the diaphragm and pelvic floor, intraabdominal pressure management and how this affects our pelvic floor musculature Knack technique   11/26/23: Neuro re-ed: SEATED diaphragmatic breathing + pelvic floor lengthening + shortening with inhalation/exhalation 2x10  Seated quick flick contractions + diaphragmatic breathing 2x10  Seated pelvic floor contraction paired with cough 2x10   Therapeutic exercise:  Sidelying clamshell+ reverse clamshell + diaphragmatic breathing 2x10  Sit to stand + diaphragmatic breathing 2x10  Bridge + diaphragmatic breathing 2x10  Self care: Relative pelvic floor anatomy and the connection between the diaphragm and pelvic floor, intraabdominal pressure management and how this affects our pelvic floor musculature Knack technique for stress urinary incontinence  Toileting mechanics and techniques to fully empty rectum when passing bowel movements   PATIENT EDUCATION:  Education details: Relative pelvic floor anatomy and the connection between the diaphragm and pelvic floor, intraabdominal pressure management and how this affects our pelvic floor musculature Person educated: Patient Education method: Explanation, Demonstration, Tactile cues, Verbal cues, and Handouts Education comprehension: verbalized understanding, returned demonstration, verbal cues required, tactile cues required, and needs further education  HOME EXERCISE PROGRAM: Access Code: [email or web address] URL: [email or web address] Date: 12/03/2023 Prepared by: Robbin Chill  Exercises - Seated Pelvic Floor Contraction  - 1 x daily - 7 x weekly - 2 sets - 10 reps - Seated Quick Flick Pelvic Floor Contractions  - 1 x daily - 7 x weekly - 2 sets - 10 reps - Seated Cough with Pelvic Floor Contraction and Hand to Mouth  - 1 x daily - 7 x weekly - 2 sets - 10 reps - Sit to Stand Without Arm Support  - 1 x daily - 7 x weekly - 2 sets - 10 reps - Clamshell  - 1 x daily - 7 x weekly - 2 sets - 10 reps - Sidelying Reverse Clamshell  - 1 x daily - 7 x weekly - 2 sets - 10 reps - Supine Bridge  - 1 x daily - 7 x weekly - 2 sets - 10 reps  ASSESSMENT:  CLINICAL IMPRESSION: Patient is a 68 y.o. female  who was seen today for physical therapy treatment for stress incontinence. She reports less incontinence episodes within the past week, and she is no longer wearing her long pads during  the day, rather she is wearing short ones. She is still leaking urine with a full bladder, but leakage amount has decreased in volume. Hip and glute strengthening introduced today with emphasis on diaphragmatic breathing to control her pelvic floor and stress urinary incontinence. No pain or leakage following today's session and Pt would benefit from additional PT to further address deficits.    OBJECTIVE IMPAIRMENTS: decreased coordination, decreased endurance, decreased mobility, decreased ROM, and decreased strength.   ACTIVITY LIMITATIONS: continence  PARTICIPATION LIMITATIONS: N/A  PERSONAL FACTORS: Age, Past/current experiences, and Time since onset of injury/illness/exacerbation are also affecting patient's functional outcome.   REHAB  POTENTIAL: Good  CLINICAL DECISION MAKING: Stable/uncomplicated  EVALUATION COMPLEXITY: Low   GOALS: Goals reviewed with patient? Yes  SHORT TERM GOALS: Target date: 11/28/2023  Pt will be independent with HEP.  Baseline: Goal status: INITIAL  2.  Pt will be independent with the knack, urge suppression technique, and double voiding in order to improve bladder habits and decrease urinary incontinence.   Baseline:  Goal status: INITIAL  3.  Pt will report no leaks with laughing, coughing, sneezing in order to improve comfort with interpersonal relationships and community activities so that she does not have to change pads multiple times per day. Baseline:  Goal status: INITIAL  LONG TERM GOALS: Target date: 05/01/2024  Pt will be independent with advanced HEP.  Baseline:  Goal status: INITIAL  2.  Pt to demonstrate improved coordination of pelvic floor and breathing mechanics with 10# squat with appropriate synergistic patterns to decrease pain and leakage at least 75% of the time.   Baseline:  Goal status: INITIAL  3.  Pt will be able to lift at least 10 lb correctly for 10 reps without pain or leakage for functional activities to allow  patient to pick up weighted objects in public without leaking urine.  Baseline:  Goal status: INITIAL  4.  Pt will demonstrate increase in all impaired hip strength by 1 muscle grades in order to demonstrate improved lumbopelvic support and increase functional ability.   Baseline:  Goal status: INITIAL  PLAN:  PT FREQUENCY: 1-2x/week  PT DURATION: 6 months  PLANNED INTERVENTIONS: 97110-Therapeutic exercises, 97530- Therapeutic activity, 97112- Neuromuscular re-education, 97535- Self Care, [id number]- Manual therapy, Taping, Dry Needling, Joint mobilization, Spinal mobilization, Scar mobilization, Cryotherapy, and Moist heat  PLAN FOR NEXT SESSION: continued pelvic floor AROM training in seated, introduce hip and core strengthening, introduce downtraining stretches   Marni Sins, PT 12/03/2023, 10:52 AM

## 2023-12-24 ENCOUNTER — Ambulatory Visit: Attending: Obstetrics and Gynecology | Admitting: Physical Therapy

## 2023-12-24 DIAGNOSIS — M6281 Muscle weakness (generalized): Secondary | ICD-10-CM | POA: Diagnosis present

## 2023-12-24 DIAGNOSIS — R279 Unspecified lack of coordination: Secondary | ICD-10-CM | POA: Insufficient documentation

## 2023-12-24 DIAGNOSIS — R293 Abnormal posture: Secondary | ICD-10-CM | POA: Insufficient documentation

## 2023-12-24 NOTE — Therapy (Signed)
 OUTPATIENT PHYSICAL THERAPY FEMALE PELVIC TREATMENT   Patient Name: Chelsea Bell MRN: [id number] DOB:04-14-1956, 68 y.o., female Today's Date: 12/24/2023  END OF SESSION:  PT End of Session - 12/24/23 1232     Visit Number 5    Number of Visits 10    Authorization Type BC[employer] Medicare    PT Start Time 1145    PT Stop Time 1230    PT Time Calculation (min) 45 min    Activity Tolerance Patient tolerated treatment well    Behavior During Therapy WFL for tasks assessed/performed                 Past Medical History:  Diagnosis Date   Cancer (HCC)    endometrial cancer- surgery only   GERD (gastroesophageal reflux disease)    Past Surgical History:  Procedure Laterality Date   ABDOMINAL HYSTERECTOMY     COLONOSCOPY WITH PROPOFOL  N/A 06/09/2014   Procedure: COLONOSCOPY WITH PROPOFOL ;  Surgeon: Brice Campi, MD;  Location: [facility];  Service: Endoscopy;  Laterality: N/A;   DILATION AND CURETTAGE OF UTERUS     LAPAROSCOPIC OVARIAN     diagnostic with 1 ovary removed , then Hysterectomy(endometrial cancer) with other ovary removed   TONSILLECTOMY     Patient Active Problem List   Diagnosis Date Noted   Overactive bladder 10/29/2023   SUI (stress urinary incontinence, female) 10/29/2023   Chest pain 10/07/2021    PCP: Minus Amel, MD   REFERRING PROVIDER: Arma Lamp, MD   REFERRING DIAG: N32.81 (ICD-10-CM) - Overactive bladder N39.3 (ICD-10-CM) - SUI (stress urinary incontinence, female)  THERAPY DIAG:  Muscle weakness (generalized)  Abnormal posture  Rationale for Evaluation and Treatment: Rehabilitation  ONSET DATE: unknown   SUBJECTIVE:                                                                                                                                                                                           SUBJECTIVE STATEMENT: "kathy" Patient reports that she has had a good week. She signed up recently for a stem  cell phototherapy program to help with her pain in the feet and legs. She is trying this out for a month and will report back on how she feels. She reports that fecal urgency is improved and squatty potty is helpful. She is not leaking on herself which she is pleased with. Sometimes when she wakes up after a long bout of sleep, she will leak when trying to get her pants down at the toilet. No instances of urine running down her leg when she walks to the bathroom now. Minimal leakage  during the day unless she is in the middle of a task and doesn't get up to go to the bathroom in time.   Eval: Patient reports to pelvic PT with urinary incontinence that has been getting progressively worse for many years. Some days she wears 2 pads, some days she has full bladder loss and requires more frequent changes. The pads she uses usually only catch 4-5 drops of urine when she does leak. No bowel related concerns to report. No pelvic pain to report.  Fluid intake: is trying to increase her water intake, she is trying to cut back on soda/caffeine   PAIN:  Are you having pain? No NPRS scale: 0/10  PRECAUTIONS: None  RED FLAGS: None   WEIGHT BEARING RESTRICTIONS: No  FALLS:  Has patient fallen in last 6 months? 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, is a Facilities manager   ACTIVITY LEVEL : not currently exercising consistently   PLOF: Independent  PATIENT GOALS: to decrease urinary leakage and control bladder better   PERTINENT HISTORY:  Has plantar fasciitis and she uses a cane to help her ambulate  Hx: GERD, endometrial cancer (surgery), abdominal hysterectomy, ovarian removal  Sexual abuse: No  BOWEL MOVEMENT: Pain with bowel movement: No Type of bowel movement:Type (Bristol Stool Scale) 4, Frequency 1x/day, Strain no, and Splinting no Fully empty rectum: Yes:   Leakage: No Pads: No Fiber supplement/laxative No  URINATION: Pain with urination: No Fully empty bladder: Noshe  feels like she empties, but there is some residual voiding volume  Stream: Weak Urgency: Yes  Frequency: feels like she goes "all the time" - goes 1-3x/night  Leakage: Urge to void, Walking to the bathroom, Coughing, Sneezing, and Laughing Pads: Yes: long pads 4 or 5 drops   INTERCOURSE: not currently sexually active   PREGNANCY: Vaginal deliveries 0 C-section deliveries 0  PROLAPSE: None  OBJECTIVE:  Note: Objective measures were completed at Evaluation unless otherwise noted.  PATIENT SURVEYS:  PFIQ-7: 14  COGNITION: Overall cognitive status: Within functional limits for tasks assessed     SENSATION: Light touch: Appears intact  LUMBAR SPECIAL TESTS:  Single leg stance test: Positive  FUNCTIONAL TESTS:  Squat: mild dynamic knee valgus bilaterally with sit to stand transfer   GAIT: Assistive device utilized: Single point cane Comments: moderate trendelenburg gait pattern with ambulation   POSTURE: rounded shoulders, forward head, increased thoracic kyphosis, and flexed trunk    LUMBARAROM/PROM: within functional limits   A/PROM A/PROM  eval  Flexion   Extension   Right lateral flexion   Left lateral flexion   Right rotation   Left rotation    (Blank rows = not tested)  LOWER EXTREMITY ROM: within functional limits   Active ROM Right eval Left eval  Hip flexion    Hip extension    Hip abduction    Hip adduction    Hip internal rotation    Hip external rotation    Knee flexion    Knee extension    Ankle dorsiflexion    Ankle plantarflexion    Ankle inversion    Ankle eversion     (Blank rows = not tested)  LOWER EXTREMITY MMT: 4-/5 bilateral knees and hips grossly   MMT Right eval Left eval  Hip flexion    Hip extension    Hip abduction    Hip adduction    Hip internal rotation    Hip external rotation    Knee flexion  Knee extension    Ankle dorsiflexion    Ankle plantarflexion    Ankle inversion    Ankle eversion     (Blank  rows = not tested) PALPATION:   General: no significant tenderness to palpation at bilateral adductors or hip flexors   Pelvic Alignment: within normal limits   Abdominal: decreased rib excursion with inhalation, abdominal bracing at rest, upper chest breathing at rest                External Perineal Exam: mild dryness noted with sufficient clitoral hood mobility present                               Internal Pelvic Floor: Patient fully consents to today's internal examination. She demonstrates a stiffness throughout the superficial and deep layers of the pelvic floor bilaterally, but no tenderness to palpation of superficial or deep musculature throughout. Patient demonstrates a lack of coordination in the pelvic floor with inhalation/exhalation. Following internal cueing, patient was able to actively lengthen and shorten the pelvic floor with her breathing techniques. Repetitive pelvic floor contractions were challenging for the patient.   Patient confirms identification and approves PT to assess internal pelvic floor and treatment Yes No emotional/communication barriers or cognitive limitation. Patient is motivated to learn. Patient understands and agrees with treatment goals and plan. PT explains patient will be examined in standing, sitting, and lying down to see how their muscles and joints work. When they are ready, they will be asked to remove their underwear so PT can examine their perineum. The patient is also given the option of providing their own chaperone as one is not provided in our facility. The patient also has the right and is explained the right to defer or refuse any part of the evaluation or treatment including the internal exam. With the patient's consent, PT will use one gloved finger to gently assess the muscles of the pelvic floor, seeing how well it contracts and relaxes and if there is muscle symmetry. After, the patient will get dressed and PT and patient will discuss exam  findings and plan of care. PT and patient discuss plan of care, schedule, attendance policy and HEP activities.  PELVIC MMT:   MMT eval  Vaginal 3/5 manual muscle test score, 5 quick flicks that were challenging, 0 second hold  Internal Anal Sphincter   External Anal Sphincter   Puborectalis   Diastasis Recti   (Blank rows = not tested)        TONE: Within normal limits   PROLAPSE: No significant anterior or posterior vaginal wall laxity present   TODAY'S TREATMENT:                                                                                                                              DATE:   11/06/23: Neuro re-ed: SEATED diaphragmatic breathing + pelvic floor lengthening + shortening  with inhalation/exhalation 2x10  Hooklying diaphragmatic breathing + pelvic floor quick flick contractions 2x10  Manual therapy: Internal cueing for pelvic floor lengthening with inhalation and shortening with exhalation  Internal cueing for quick flick pelvic floor contractions as she exhales  Self care: Relative pelvic floor anatomy and the connection between the diaphragm and pelvic floor, intraabdominal pressure management and how this affects our pelvic floor musculature Knack technique   11/26/23: Neuro re-ed: SEATED diaphragmatic breathing + pelvic floor lengthening + shortening with inhalation/exhalation 2x10  Seated quick flick contractions + diaphragmatic breathing 2x10  Seated pelvic floor contraction paired with cough 2x10  Therapeutic exercise:  Sidelying clamshell+ reverse clamshell + diaphragmatic breathing 2x10  Sit to stand + diaphragmatic breathing 2x10  Bridge + diaphragmatic breathing 2x10  Self care: Relative pelvic floor anatomy and the connection between the diaphragm and pelvic floor, intraabdominal pressure management and how this affects our pelvic floor musculature Knack technique for stress urinary incontinence  Toileting mechanics and techniques to fully empty  rectum when passing bowel movements   12/24/23: Neuro re-ed: standing diaphragmatic breathing + pelvic floor lengthening + shortening with inhalation/exhalation 2x10  Seated quick flick contractions + diaphragmatic breathing 2x10  Seated pelvic floor contraction paired with cough 2x10  Therapeutic exercise:  Sidelying clamshell+ reverse clamshell + diaphragmatic breathing 2x10  Sit to stand + diaphragmatic breathing 2x10  Bridge + diaphragmatic breathing 2x10  Step up + diaphragmatic breathing 2x5 each  Standing hip abduction + diaphragmatic breathing 2x10  Standing marching at counter + diaphragmatic breathing 2x10  Self care: Relative pelvic floor anatomy and the connection between the diaphragm and pelvic floor, intraabdominal pressure management and how this affects our pelvic floor musculature Knack technique for stress urinary incontinence  Toileting mechanics and techniques to fully empty rectum when passing bowel movements   PATIENT EDUCATION:  Education details: Relative pelvic floor anatomy and the connection between the diaphragm and pelvic floor, intraabdominal pressure management and how this affects our pelvic floor musculature Person educated: Patient Education method: Explanation, Demonstration, Tactile cues, Verbal cues, and Handouts Education comprehension: verbalized understanding, returned demonstration, verbal cues required, tactile cues required, and needs further education  HOME EXERCISE PROGRAM: Access Code: [email or web address] URL: [email or web address] Date: 12/24/2023 Prepared by: Robbin Chill  Exercises - Standing Pelvic Floor Contraction  - 1 x daily - 7 x weekly - 2 sets - 10 reps - Seated Quick Flick Pelvic Floor Contractions  - 1 x daily - 7 x weekly - 2 sets - 10 reps - Seated Cough with Pelvic Floor Contraction and Hand to Mouth  - 1 x daily - 7 x weekly - 1 sets - 10 reps - Sit to Stand Without Arm Support  - 1 x daily - 7 x weekly - 2 sets - 10  reps - Clamshell  - 1 x daily - 7 x weekly - 2 sets - 10 reps - Sidelying Reverse Clamshell  - 1 x daily - 7 x weekly - 2 sets - 10 reps - Supine Bridge  - 1 x daily - 7 x weekly - 2 sets - 10 reps - Standing Hip Abduction with Counter Support  - 1 x daily - 7 x weekly - 2 sets - 10 reps - Standing Marching  - 1 x daily - 7 x weekly - 2 sets - 10 reps - Step Up  - 1 x daily - 7 x weekly - 2 sets - 5 reps  ASSESSMENT:  CLINICAL IMPRESSION: Patient is a 68 y.o.  female  who was seen today for physical therapy treatment for stress incontinence. Leakage has been improving when she wakes up and throughout the day, still leaking occasionally with activity and with increased urgency in the mornings. HEP has been going well and patient reports consistency with her program. Hip and glute strengthening progressed today with emphasis on diaphragmatic breathing to control her pelvic floor and stress urinary incontinence. No pain or leakage following today's session and Pt would benefit from additional PT to further address deficits.    OBJECTIVE IMPAIRMENTS: decreased coordination, decreased endurance, decreased mobility, decreased ROM, and decreased strength.   ACTIVITY LIMITATIONS: continence  PARTICIPATION LIMITATIONS: N/A  PERSONAL FACTORS: Age, Past/current experiences, and Time since onset of injury/illness/exacerbation are also affecting patient's functional outcome.   REHAB POTENTIAL: Good  CLINICAL DECISION MAKING: Stable/uncomplicated  EVALUATION COMPLEXITY: Low   GOALS: Goals reviewed with patient? Yes  SHORT TERM GOALS: Target date: 11/28/2023  Pt will be independent with HEP.  Baseline: Goal status: INITIAL  2.  Pt will be independent with the knack, urge suppression technique, and double voiding in order to improve bladder habits and decrease urinary incontinence.   Baseline:  Goal status: INITIAL  3.  Pt will report no leaks with laughing, coughing, sneezing in order to  improve comfort with interpersonal relationships and community activities so that she does not have to change pads multiple times per day. Baseline:  Goal status: INITIAL  LONG TERM GOALS: Target date: 05/01/2024  Pt will be independent with advanced HEP.  Baseline:  Goal status: INITIAL  2.  Pt to demonstrate improved coordination of pelvic floor and breathing mechanics with 10# squat with appropriate synergistic patterns to decrease pain and leakage at least 75% of the time.   Baseline:  Goal status: INITIAL  3.  Pt will be able to lift at least 10 lb correctly for 10 reps without pain or leakage for functional activities to allow patient to pick up weighted objects in public without leaking urine.  Baseline:  Goal status: INITIAL  4.  Pt will demonstrate increase in all impaired hip strength by 1 muscle grades in order to demonstrate improved lumbopelvic support and increase functional ability.   Baseline:  Goal status: INITIAL  PLAN:  PT FREQUENCY: 1-2x/week  PT DURATION: 6 months  PLANNED INTERVENTIONS: 97110-Therapeutic exercises, 97530- Therapeutic activity, 97112- Neuromuscular re-education, 97535- Self Care, [id number]- Manual therapy, Taping, Dry Needling, Joint mobilization, Spinal mobilization, Scar mobilization, Cryotherapy, and Moist heat  PLAN FOR NEXT SESSION: continued pelvic floor AROM training in seated, introduce hip and core strengthening, introduce downtraining stretches   Marni Sins, PT 12/24/2023, 12:33 PM

## 2023-12-31 ENCOUNTER — Ambulatory Visit: Admitting: Physical Therapy

## 2023-12-31 DIAGNOSIS — M6281 Muscle weakness (generalized): Secondary | ICD-10-CM

## 2023-12-31 DIAGNOSIS — R293 Abnormal posture: Secondary | ICD-10-CM

## 2023-12-31 DIAGNOSIS — R279 Unspecified lack of coordination: Secondary | ICD-10-CM

## 2023-12-31 NOTE — Therapy (Signed)
 OUTPATIENT PHYSICAL THERAPY FEMALE PELVIC TREATMENT   Patient Name: Chelsea Bell MRN: [id number] DOB:Feb 21, 1956, 68 y.o., female Today's Date: 12/31/2023  END OF SESSION:  PT End of Session - 12/31/23 1230     Visit Number 6    Number of Visits 10    Authorization Type [employer] Medicare    PT Start Time 1145    PT Stop Time 1230    PT Time Calculation (min) 45 min    Activity Tolerance Patient tolerated treatment well    Behavior During Therapy WFL for tasks assessed/performed                  Past Medical History:  Diagnosis Date   Cancer (HCC)    endometrial cancer- surgery only   GERD (gastroesophageal reflux disease)    Past Surgical History:  Procedure Laterality Date   ABDOMINAL HYSTERECTOMY     COLONOSCOPY WITH PROPOFOL  N/A 06/09/2014   Procedure: COLONOSCOPY WITH PROPOFOL ;  Surgeon: Brice Campi, MD;  Location: [facility];  Service: Endoscopy;  Laterality: N/A;   DILATION AND CURETTAGE OF UTERUS     LAPAROSCOPIC OVARIAN     diagnostic with 1 ovary removed , then Hysterectomy(endometrial cancer) with other ovary removed   TONSILLECTOMY     Patient Active Problem List   Diagnosis Date Noted   Overactive bladder 10/29/2023   SUI (stress urinary incontinence, female) 10/29/2023   Chest pain 10/07/2021    PCP: Minus Amel, MD   REFERRING PROVIDER: Arma Lamp, MD   REFERRING DIAG: N32.81 (ICD-10-CM) - Overactive bladder N39.3 (ICD-10-CM) - SUI (stress urinary incontinence, female)  THERAPY DIAG:  Muscle weakness (generalized)  Abnormal posture  Unspecified lack of coordination  Rationale for Evaluation and Treatment: Rehabilitation  ONSET DATE: unknown   SUBJECTIVE:                                                                                                                                                                                           SUBJECTIVE STATEMENT: kathy Patient reports that this morning she  got up at 5 and she wasn't leaking and when she got into the bathroom, she tripped over a stool and leaked some then. Other days she will leak more in the pad when trying to get to the bathroom. She is no longer experiencing leaking that will run down her leg. She is considering joining [employer] Yesterday she had a bout of fecal urgency but was able to control it and get to the bathroom on time.   Eval: Patient reports to pelvic PT with urinary incontinence that has been getting progressively worse for  many years. Some days she wears 2 pads, some days she has full bladder loss and requires more frequent changes. The pads she uses usually only catch 4-5 drops of urine when she does leak. No bowel related concerns to report. No pelvic pain to report.  Fluid intake: is trying to increase her water intake, she is trying to cut back on soda/caffeine   PAIN:  Are you having pain? No NPRS scale: 0/10  PRECAUTIONS: None  RED FLAGS: None   WEIGHT BEARING RESTRICTIONS: No  FALLS:  Has patient fallen in last 6 months? 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, is a Facilities manager   ACTIVITY LEVEL : not currently exercising consistently   PLOF: Independent  PATIENT GOALS: to decrease urinary leakage and control bladder better   PERTINENT HISTORY:  Has plantar fasciitis and she uses a cane to help her ambulate  Hx: GERD, endometrial cancer (surgery), abdominal hysterectomy, ovarian removal  Sexual abuse: No  BOWEL MOVEMENT: Pain with bowel movement: No Type of bowel movement:Type (Bristol Stool Scale) 4, Frequency 1x/day, Strain no, and Splinting no Fully empty rectum: Yes:   Leakage: No Pads: No Fiber supplement/laxative No  URINATION: Pain with urination: No Fully empty bladder: Noshe feels like she empties, but there is some residual voiding volume  Stream: Weak Urgency: Yes  Frequency: feels like she goes all the time - goes 1-3x/night  Leakage: Urge to void,  Walking to the bathroom, Coughing, Sneezing, and Laughing Pads: Yes: long pads 4 or 5 drops   INTERCOURSE: not currently sexually active   PREGNANCY: Vaginal deliveries 0 C-section deliveries 0  PROLAPSE: None  OBJECTIVE:  Note: Objective measures were completed at Evaluation unless otherwise noted.  PATIENT SURVEYS: PFIQ-7: 14  COGNITION: Overall cognitive status: Within functional limits for tasks assessed     SENSATION: Light touch: Appears intact  LUMBAR SPECIAL TESTS:  Single leg stance test: Positive  FUNCTIONAL TESTS:  Squat: mild dynamic knee valgus bilaterally with sit to stand transfer   GAIT: Assistive device utilized: Single point cane Comments: moderate trendelenburg gait pattern with ambulation   POSTURE: rounded shoulders, forward head, increased thoracic kyphosis, and flexed trunk    LUMBARAROM/PROM: within functional limits   A/PROM A/PROM  eval  Flexion   Extension   Right lateral flexion   Left lateral flexion   Right rotation   Left rotation    (Blank rows = not tested)  LOWER EXTREMITY ROM: within functional limits   Active ROM Right eval Left eval  Hip flexion    Hip extension    Hip abduction    Hip adduction    Hip internal rotation    Hip external rotation    Knee flexion    Knee extension    Ankle dorsiflexion    Ankle plantarflexion    Ankle inversion    Ankle eversion     (Blank rows = not tested)  LOWER EXTREMITY MMT: 4-/5 bilateral knees and hips grossly   MMT Right eval Left eval  Hip flexion    Hip extension    Hip abduction    Hip adduction    Hip internal rotation    Hip external rotation    Knee flexion    Knee extension    Ankle dorsiflexion    Ankle plantarflexion    Ankle inversion    Ankle eversion     (Blank rows = not tested) PALPATION:   General: no significant tenderness to palpation at bilateral  adductors or hip flexors   Pelvic Alignment: within normal limits   Abdominal: decreased  rib excursion with inhalation, abdominal bracing at rest, upper chest breathing at rest                External Perineal Exam: mild dryness noted with sufficient clitoral hood mobility present                               Internal Pelvic Floor: Patient fully consents to today's internal examination. She demonstrates a stiffness throughout the superficial and deep layers of the pelvic floor bilaterally, but no tenderness to palpation of superficial or deep musculature throughout. Patient demonstrates a lack of coordination in the pelvic floor with inhalation/exhalation. Following internal cueing, patient was able to actively lengthen and shorten the pelvic floor with her breathing techniques. Repetitive pelvic floor contractions were challenging for the patient.   Patient confirms identification and approves PT to assess internal pelvic floor and treatment Yes No emotional/communication barriers or cognitive limitation. Patient is motivated to learn. Patient understands and agrees with treatment goals and plan. PT explains patient will be examined in standing, sitting, and lying down to see how their muscles and joints work. When they are ready, they will be asked to remove their underwear so PT can examine their perineum. The patient is also given the option of providing their own chaperone as one is not provided in our facility. The patient also has the right and is explained the right to defer or refuse any part of the evaluation or treatment including the internal exam. With the patient's consent, PT will use one gloved finger to gently assess the muscles of the pelvic floor, seeing how well it contracts and relaxes and if there is muscle symmetry. After, the patient will get dressed and PT and patient will discuss exam findings and plan of care. PT and patient discuss plan of care, schedule, attendance policy and HEP activities.  PELVIC MMT:   MMT eval  Vaginal 3/5 manual muscle test score, 5 quick  flicks that were challenging, 0 second hold  Internal Anal Sphincter   External Anal Sphincter   Puborectalis   Diastasis Recti   (Blank rows = not tested)        TONE: Within normal limits   PROLAPSE: No significant anterior or posterior vaginal wall laxity present   TODAY'S TREATMENT:                                                                                                                              DATE:   11/26/23: Neuro re-ed: SEATED diaphragmatic breathing + pelvic floor lengthening + shortening with inhalation/exhalation 2x10  Seated quick flick contractions + diaphragmatic breathing 2x10  Seated pelvic floor contraction paired with cough 2x10  Therapeutic exercise:  Sidelying clamshell+ reverse clamshell + diaphragmatic breathing 2x10  Sit to stand + diaphragmatic breathing 2x10  Bridge + diaphragmatic breathing 2x10  Self care: Relative pelvic floor anatomy and the connection between the diaphragm and pelvic floor, intraabdominal pressure management and how this affects our pelvic floor musculature Knack technique for stress urinary incontinence  Toileting mechanics and techniques to fully empty rectum when passing bowel movements   12/24/23: Neuro re-ed: standing diaphragmatic breathing + pelvic floor lengthening + shortening with inhalation/exhalation 2x10  Seated quick flick contractions + diaphragmatic breathing 2x10  Seated pelvic floor contraction paired with cough 2x10  Therapeutic exercise:  Sidelying clamshell+ reverse clamshell + diaphragmatic breathing 2x10  Sit to stand + diaphragmatic breathing 2x10  Bridge + diaphragmatic breathing 2x10  Step up + diaphragmatic breathing 2x5 each  Standing hip abduction + diaphragmatic breathing 2x10  Standing marching at counter + diaphragmatic breathing 2x10  Self care: Relative pelvic floor anatomy and the connection between the diaphragm and pelvic floor, intraabdominal pressure management and how this affects  our pelvic floor musculature Knack technique for stress urinary incontinence  Toileting mechanics and techniques to fully empty rectum when passing bowel movements   12/31/23: Neuro re-ed: standing diaphragmatic breathing + pelvic floor lengthening + shortening with inhalation/exhalation 2x10  Seated quick flick contractions + diaphragmatic breathing 2x10  Seated pelvic floor contraction paired with cough 2x10  Standing plie with pelvic floor contraction + diaphragmatic breathing 2x10  Therapeutic exercise:  Step up + diaphragmatic breathing 2x6 each  Standing hip abduction with 2# ankle weights + diaphragmatic breathing 2x10  Standing marching at counter with 2# ankle weights + diaphragmatic breathing 2x10  Self care: Relative pelvic floor anatomy and the connection between the diaphragm and pelvic floor, intraabdominal pressure management and how this affects our pelvic floor musculature Knack technique for stress urinary incontinence  Toileting mechanics and techniques to fully empty rectum when passing bowel movements   PATIENT EDUCATION:  Education details: Relative pelvic floor anatomy and the connection between the diaphragm and pelvic floor, intraabdominal pressure management and how this affects our pelvic floor musculature Person educated: Patient Education method: Explanation, Demonstration, Tactile cues, Verbal cues, and Handouts Education comprehension: verbalized understanding, returned demonstration, verbal cues required, tactile cues required, and needs further education  HOME EXERCISE PROGRAM: Access Code: [email or web address] URL: [email or web address] Date: 12/31/2023 Prepared by: Robbin Chill  Exercises - Standing Pelvic Floor Contraction  - 1 x daily - 7 x weekly - 2 sets - 10 reps - Standing Plie With Pelvic Floor Contraction  - 1 x daily - 7 x weekly - 2 sets - 10 reps - Sit to Stand Without Arm Support  - 1 x daily - 7 x weekly - 2 sets - 10 reps - Clamshell   - 1 x daily - 7 x weekly - 2 sets - 10 reps - Sidelying Reverse Clamshell  - 1 x daily - 7 x weekly - 2 sets - 10 reps - Supine Bridge  - 1 x daily - 7 x weekly - 2 sets - 10 reps - Standing Hip Abduction with Counter Support  - 1 x daily - 7 x weekly - 2 sets - 10 reps - Standing Marching  - 1 x daily - 7 x weekly - 2 sets - 10 reps - Step Up  - 1 x daily - 7 x weekly - 2 sets - 5 reps  ASSESSMENT:  CLINICAL IMPRESSION: Patient is a 68 y.o. female  who was seen today for physical therapy treatment for stress incontinence. Leakage has been improving when she wakes up and throughout the  day, still leaking occasionally with increased urgency in the mornings when she wakes. Standing quick flick pelvic floor contractions introduced today. HEP has been going well and patient reports consistency with her program. Hip and glute strengthening progressed today with2# weights with emphasis on diaphragmatic breathing to control her pelvic floor and stress urinary incontinence. No pain or leakage following today's session and Pt would benefit from additional PT to further address deficits.    OBJECTIVE IMPAIRMENTS: decreased coordination, decreased endurance, decreased mobility, decreased ROM, and decreased strength.   ACTIVITY LIMITATIONS: continence  PARTICIPATION LIMITATIONS: N/A  PERSONAL FACTORS: Age, Past/current experiences, and Time since onset of injury/illness/exacerbation are also affecting patient's functional outcome.   REHAB POTENTIAL: Good  CLINICAL DECISION MAKING: Stable/uncomplicated  EVALUATION COMPLEXITY: Low   GOALS: Goals reviewed with patient? Yes  SHORT TERM GOALS: Target date: 11/28/2023  Pt will be independent with HEP.  Baseline: Goal status: INITIAL  2.  Pt will be independent with the knack, urge suppression technique, and double voiding in order to improve bladder habits and decrease urinary incontinence.   Baseline:  Goal status: INITIAL  3.  Pt will report  no leaks with laughing, coughing, sneezing in order to improve comfort with interpersonal relationships and community activities so that she does not have to change pads multiple times per day. Baseline:  Goal status: INITIAL  LONG TERM GOALS: Target date: 05/01/2024  Pt will be independent with advanced HEP.  Baseline:  Goal status: INITIAL  2.  Pt to demonstrate improved coordination of pelvic floor and breathing mechanics with 10# squat with appropriate synergistic patterns to decrease pain and leakage at least 75% of the time.   Baseline:  Goal status: INITIAL  3.  Pt will be able to lift at least 10 lb correctly for 10 reps without pain or leakage for functional activities to allow patient to pick up weighted objects in public without leaking urine.  Baseline:  Goal status: INITIAL  4.  Pt will demonstrate increase in all impaired hip strength by 1 muscle grades in order to demonstrate improved lumbopelvic support and increase functional ability.   Baseline:  Goal status: INITIAL  PLAN:  PT FREQUENCY: 1-2x/week  PT DURATION: 6 months  PLANNED INTERVENTIONS: 97110-Therapeutic exercises, 97530- Therapeutic activity, 97112- Neuromuscular re-education, 97535- Self Care, [id number]- Manual therapy, Taping, Dry Needling, Joint mobilization, Spinal mobilization, Scar mobilization, Cryotherapy, and Moist heat  PLAN FOR NEXT SESSION: continued pelvic floor AROM training in seated, introduce hip and core strengthening, introduce downtraining stretches   Marni Sins, PT 12/31/2023, 12:30 PM

## 2024-01-07 ENCOUNTER — Ambulatory Visit: Admitting: Physical Therapy

## 2024-01-07 DIAGNOSIS — M6281 Muscle weakness (generalized): Secondary | ICD-10-CM | POA: Diagnosis not present

## 2024-01-07 NOTE — Therapy (Signed)
 OUTPATIENT PHYSICAL THERAPY FEMALE PELVIC TREATMENT   Patient Name: Chelsea Bell MRN: [id number] DOB:1955/11/17, 68 y.o., female Today's Date: 01/07/2024  END OF SESSION:  PT End of Session - 01/07/24 1326     Visit Number 7    Number of Visits 10    Authorization Type BCBS Medicare    PT Start Time 1147    PT Stop Time 1230    PT Time Calculation (min) 43 min    Activity Tolerance Patient tolerated treatment well    Behavior During Therapy WFL for tasks assessed/performed                Past Medical History:  Diagnosis Date   Cancer (HCC)    endometrial cancer- surgery only   GERD (gastroesophageal reflux disease)    Past Surgical History:  Procedure Laterality Date   ABDOMINAL HYSTERECTOMY     COLONOSCOPY WITH PROPOFOL  N/A 06/09/2014   Procedure: COLONOSCOPY WITH PROPOFOL ;  Surgeon: Brice Campi, MD;  Location: [facility];  Service: Endoscopy;  Laterality: N/A;   DILATION AND CURETTAGE OF UTERUS     LAPAROSCOPIC OVARIAN     diagnostic with 1 ovary removed , then Hysterectomy(endometrial cancer) with other ovary removed   TONSILLECTOMY     Patient Active Problem List   Diagnosis Date Noted   Overactive bladder 10/29/2023   SUI (stress urinary incontinence, female) 10/29/2023   Chest pain 10/07/2021    PCP: Minus Amel, MD   REFERRING PROVIDER: Arma Lamp, MD   REFERRING DIAG: N32.81 (ICD-10-CM) - Overactive bladder N39.3 (ICD-10-CM) - SUI (stress urinary incontinence, female)  THERAPY DIAG:  Muscle weakness (generalized)  Rationale for Evaluation and Treatment: Rehabilitation  ONSET DATE: unknown   SUBJECTIVE:                                                                                                                                                                                           SUBJECTIVE STATEMENT: Chelsea Bell Patient reports that she is doing well today, she has had a good week so far. She tried doing her  sit to stands without upper extremity support and would pee each time she tried to. She realized her bladder being full affected this. She tried to empty her bladder and then perform her HEP, she did not leak. She had a coughing spell one day this past week and didn't leak. She had two bouts of fecal urgency that she was able to control.  Eval: Patient reports to pelvic PT with urinary incontinence that has been getting progressively worse for many years. Some days she wears 2 pads, some days she has  full bladder loss and requires more frequent changes. The pads she uses usually only catch 4-5 drops of urine when she does leak. No bowel related concerns to report. No pelvic pain to report.  Fluid intake: is trying to increase her water intake, she is trying to cut back on soda/caffeine   PAIN:  Are you having pain? No NPRS scale: 0/10  PRECAUTIONS: None  RED FLAGS: None   WEIGHT BEARING RESTRICTIONS: No  FALLS:  Has patient fallen in last 6 months? 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, is a Facilities manager   ACTIVITY LEVEL : not currently exercising consistently   PLOF: Independent  PATIENT GOALS: to decrease urinary leakage and control bladder better   PERTINENT HISTORY:  Has plantar fasciitis and she uses a cane to help her ambulate  Hx: GERD, endometrial cancer (surgery), abdominal hysterectomy, ovarian removal  Sexual abuse: No  BOWEL MOVEMENT: Pain with bowel movement: No Type of bowel movement:Type (Bristol Stool Scale) 4, Frequency 1x/day, Strain no, and Splinting no Fully empty rectum: Yes:   Leakage: No Pads: No Fiber supplement/laxative No  URINATION: Pain with urination: No Fully empty bladder: Noshe feels like she empties, but there is some residual voiding volume  Stream: Weak Urgency: Yes  Frequency: feels like she goes all the time - goes 1-3x/night  Leakage: Urge to void, Walking to the bathroom, Coughing, Sneezing, and Laughing Pads:  Yes: long pads 4 or 5 drops   INTERCOURSE: not currently sexually active   PREGNANCY: Vaginal deliveries 0 C-section deliveries 0  PROLAPSE: None  OBJECTIVE:  Note: Objective measures were completed at Evaluation unless otherwise noted.  PATIENT SURVEYS: PFIQ-7: 14  COGNITION: Overall cognitive status: Within functional limits for tasks assessed     SENSATION: Light touch: Appears intact  LUMBAR SPECIAL TESTS:  Single leg stance test: Positive  FUNCTIONAL TESTS:  Squat: mild dynamic knee valgus bilaterally with sit to stand transfer   GAIT: Assistive device utilized: Single point cane Comments: moderate trendelenburg gait pattern with ambulation   POSTURE: rounded shoulders, forward head, increased thoracic kyphosis, and flexed trunk    LUMBARAROM/PROM: within functional limits   A/PROM A/PROM  eval  Flexion   Extension   Right lateral flexion   Left lateral flexion   Right rotation   Left rotation    (Blank rows = not tested)  LOWER EXTREMITY ROM: within functional limits   Active ROM Right eval Left eval  Hip flexion    Hip extension    Hip abduction    Hip adduction    Hip internal rotation    Hip external rotation    Knee flexion    Knee extension    Ankle dorsiflexion    Ankle plantarflexion    Ankle inversion    Ankle eversion     (Blank rows = not tested)  LOWER EXTREMITY MMT: 4-/5 bilateral knees and hips grossly   MMT Right eval Left eval  Hip flexion    Hip extension    Hip abduction    Hip adduction    Hip internal rotation    Hip external rotation    Knee flexion    Knee extension    Ankle dorsiflexion    Ankle plantarflexion    Ankle inversion    Ankle eversion     (Blank rows = not tested) PALPATION:   General: no significant tenderness to palpation at bilateral adductors or hip flexors   Pelvic Alignment: within normal limits  Abdominal: decreased rib excursion with inhalation, abdominal bracing at rest, upper  chest breathing at rest                External Perineal Exam: mild dryness noted with sufficient clitoral hood mobility present                               Internal Pelvic Floor: Patient fully consents to today's internal examination. She demonstrates a stiffness throughout the superficial and deep layers of the pelvic floor bilaterally, but no tenderness to palpation of superficial or deep musculature throughout. Patient demonstrates a lack of coordination in the pelvic floor with inhalation/exhalation. Following internal cueing, patient was able to actively lengthen and shorten the pelvic floor with her breathing techniques. Repetitive pelvic floor contractions were challenging for the patient.   Patient confirms identification and approves PT to assess internal pelvic floor and treatment Yes No emotional/communication barriers or cognitive limitation. Patient is motivated to learn. Patient understands and agrees with treatment goals and plan. PT explains patient will be examined in standing, sitting, and lying down to see how their muscles and joints work. When they are ready, they will be asked to remove their underwear so PT can examine their perineum. The patient is also given the option of providing their own chaperone as one is not provided in our facility. The patient also has the right and is explained the right to defer or refuse any part of the evaluation or treatment including the internal exam. With the patient's consent, PT will use one gloved finger to gently assess the muscles of the pelvic floor, seeing how well it contracts and relaxes and if there is muscle symmetry. After, the patient will get dressed and PT and patient will discuss exam findings and plan of care. PT and patient discuss plan of care, schedule, attendance policy and HEP activities.  PELVIC MMT:   MMT eval  Vaginal 3/5 manual muscle test score, 5 quick flicks that were challenging, 0 second hold  Internal Anal  Sphincter   External Anal Sphincter   Puborectalis   Diastasis Recti   (Blank rows = not tested)        TONE: Within normal limits   PROLAPSE: No significant anterior or posterior vaginal wall laxity present   TODAY'S TREATMENT:                                                                                                                              DATE:   12/24/23: Neuro re-ed: standing diaphragmatic breathing + pelvic floor lengthening + shortening with inhalation/exhalation 2x10  Seated quick flick contractions + diaphragmatic breathing 2x10  Seated pelvic floor contraction paired with cough 2x10  Therapeutic exercise:  Sidelying clamshell+ reverse clamshell + diaphragmatic breathing 2x10  Sit to stand + diaphragmatic breathing 2x10  Bridge + diaphragmatic breathing 2x10  Step up + diaphragmatic breathing 2x5 each  Standing hip abduction + diaphragmatic breathing 2x10  Standing marching at counter + diaphragmatic breathing 2x10  Self care: Relative pelvic floor anatomy and the connection between the diaphragm and pelvic floor, intraabdominal pressure management and how this affects our pelvic floor musculature Knack technique for stress urinary incontinence  Toileting mechanics and techniques to fully empty rectum when passing bowel movements   12/31/23: Neuro re-ed: standing diaphragmatic breathing + pelvic floor lengthening + shortening with inhalation/exhalation 2x10  Seated quick flick contractions + diaphragmatic breathing 2x10  Seated pelvic floor contraction paired with cough 2x10  Standing plie with pelvic floor contraction + diaphragmatic breathing 2x10  Therapeutic exercise:  Step up + diaphragmatic breathing 2x6 each  Standing hip abduction with 2# ankle weights + diaphragmatic breathing 2x10  Standing marching at counter with 2# ankle weights + diaphragmatic breathing 2x10  Self care: Relative pelvic floor anatomy and the connection between the diaphragm and  pelvic floor, intraabdominal pressure management and how this affects our pelvic floor musculature Knack technique for stress urinary incontinence  Toileting mechanics and techniques to fully empty rectum when passing bowel movements   01/07/24: Neuro re-ed: standing diaphragmatic breathing + pelvic floor lengthening + shortening with inhalation/exhalation 2x10  Sit to stand with kegel at top + diaphragmatic breathing 2x8  Standing plie with pelvic floor contraction + diaphragmatic breathing 2x10  Sidelying clam (RTB) + reverse clamshell + diaphragmatic breathing 2x8  Therapeutic exercise:  Step up + diaphragmatic breathing 2x6 each  Standing hip abduction with RTB around ankles + diaphragmatic breathing 2x10  Standing marching at counter with 2# ankle weights + diaphragmatic breathing 2x10  Self care: Relative pelvic floor anatomy and the connection between the diaphragm and pelvic floor, intraabdominal pressure management and how this affects our pelvic floor musculature Knack technique for stress urinary incontinence  Toileting mechanics and techniques to fully empty rectum when passing bowel movements   PATIENT EDUCATION:  Education details: Relative pelvic floor anatomy and the connection between the diaphragm and pelvic floor, intraabdominal pressure management and how this affects our pelvic floor musculature Person educated: Patient Education method: Explanation, Demonstration, Tactile cues, Verbal cues, and Handouts Education comprehension: verbalized understanding, returned demonstration, verbal cues required, tactile cues required, and needs further education  HOME EXERCISE PROGRAM: Access Code: [email or web address] URL: [email or web address] Date: 01/07/2024 Prepared by: Robbin Chill  Exercises - Standing Pelvic Floor Contraction  - 1 x daily - 7 x weekly - 2 sets - 10 reps - Standing Plie With Pelvic Floor Contraction  - 1 x daily - 7 x weekly - 2 sets - 10 reps - Sit  to Stand Without Arm Support  - 1 x daily - 7 x weekly - 2 sets - 8-10 reps - Clam with Resistance  - 1 x daily - 7 x weekly - 2 sets - 10 reps - Sidelying Reverse Clamshell  - 1 x daily - 7 x weekly - 2 sets - 10 reps - Supine Bridge with Resistance Band  - 1 x daily - 7 x weekly - 2 sets - 10 reps - Seated Hip Abduction  - 1 x daily - 7 x weekly - 2 sets - 10 reps - Standing Hip Abduction with Resistance at Ankles and Counter Support  - 1 x daily - 7 x weekly - 2 sets - 10 reps - Standing Marching  - 1 x daily - 7 x weekly - 2 sets - 10 reps - Step Up  - 1 x daily - 7 x weekly -  2 sets - 5 reps  ASSESSMENT:  CLINICAL IMPRESSION: Patient is a 68 y.o. female  who was seen today for physical therapy treatment for stress incontinence. Leakage has been improving overall, but she will still leak when standing up from a chair if she has a full bladder. She will also leak in the mornings when walking to the bathroom, but she is no longer leaking down her leg. Pelvic floor exercises progressed today with introduction to kegels during functional movements. Addition of bands to current HEP was tolerable and patient required minimal cueing for this. No pain or leakage following today's session and Pt would benefit from additional PT to further address deficits.    OBJECTIVE IMPAIRMENTS: decreased coordination, decreased endurance, decreased mobility, decreased ROM, and decreased strength.   ACTIVITY LIMITATIONS: continence  PARTICIPATION LIMITATIONS: N/A  PERSONAL FACTORS: Age, Past/current experiences, and Time since onset of injury/illness/exacerbation are also affecting patient's functional outcome.   REHAB POTENTIAL: Good  CLINICAL DECISION MAKING: Stable/uncomplicated  EVALUATION COMPLEXITY: Low   GOALS: Goals reviewed with patient? Yes  SHORT TERM GOALS: Target date: 11/28/2023  Pt will be independent with HEP.  Baseline: Goal status: INITIAL  2.  Pt will be independent with the  knack, urge suppression technique, and double voiding in order to improve bladder habits and decrease urinary incontinence.   Baseline:  Goal status: INITIAL  3.  Pt will report no leaks with laughing, coughing, sneezing in order to improve comfort with interpersonal relationships and community activities so that she does not have to change pads multiple times per day. Baseline:  Goal status: INITIAL  LONG TERM GOALS: Target date: 05/01/2024  Pt will be independent with advanced HEP.  Baseline:  Goal status: INITIAL  2.  Pt to demonstrate improved coordination of pelvic floor and breathing mechanics with 10# squat with appropriate synergistic patterns to decrease pain and leakage at least 75% of the time.   Baseline:  Goal status: INITIAL  3.  Pt will be able to lift at least 10 lb correctly for 10 reps without pain or leakage for functional activities to allow patient to pick up weighted objects in public without leaking urine.  Baseline:  Goal status: INITIAL  4.  Pt will demonstrate increase in all impaired hip strength by 1 muscle grades in order to demonstrate improved lumbopelvic support and increase functional ability.   Baseline:  Goal status: INITIAL  PLAN:  PT FREQUENCY: 1-2x/week  PT DURATION: 6 months  PLANNED INTERVENTIONS: 97110-Therapeutic exercises, 97530- Therapeutic activity, 97112- Neuromuscular re-education, 97535- Self Care, [id number]- Manual therapy, Taping, Dry Needling, Joint mobilization, Spinal mobilization, Scar mobilization, Cryotherapy, and Moist heat  PLAN FOR NEXT SESSION: continued pelvic floor AROM training in seated, introduce hip and core strengthening, introduce downtraining stretches   Marni Sins, PT 01/07/2024, 1:26 PM

## 2024-01-11 ENCOUNTER — Other Ambulatory Visit: Payer: Self-pay

## 2024-01-11 ENCOUNTER — Encounter (HOSPITAL_COMMUNITY): Payer: Self-pay

## 2024-01-11 ENCOUNTER — Emergency Department (HOSPITAL_COMMUNITY)

## 2024-01-11 ENCOUNTER — Emergency Department (HOSPITAL_COMMUNITY)
Admission: EM | Admit: 2024-01-11 | Discharge: 2024-01-11 | Disposition: A | Discharge status: Home / Self Care | Attending: Student | Admitting: Student

## 2024-01-11 DIAGNOSIS — Y9301 Activity, walking, marching and hiking: Secondary | ICD-10-CM | POA: Insufficient documentation

## 2024-01-11 DIAGNOSIS — S060X0A Concussion without loss of consciousness, initial encounter: Secondary | ICD-10-CM

## 2024-01-11 DIAGNOSIS — M25512 Pain in left shoulder: Secondary | ICD-10-CM | POA: Insufficient documentation

## 2024-01-11 DIAGNOSIS — W01198A Fall on same level from slipping, tripping and stumbling with subsequent striking against other object, initial encounter: Secondary | ICD-10-CM | POA: Insufficient documentation

## 2024-01-11 DIAGNOSIS — R519 Headache, unspecified: Secondary | ICD-10-CM | POA: Diagnosis present

## 2024-01-11 DIAGNOSIS — M25562 Pain in left knee: Secondary | ICD-10-CM | POA: Insufficient documentation

## 2024-01-11 DIAGNOSIS — S0083XA Contusion of other part of head, initial encounter: Secondary | ICD-10-CM | POA: Diagnosis not present

## 2024-01-11 MED ORDER — KETOROLAC TROMETHAMINE 15 MG/ML IJ SOLN
15.0000 mg | Freq: Once | INTRAMUSCULAR | Status: AC
  Administered 2024-01-11: 15 mg via INTRAMUSCULAR
  Filled 2024-01-11: qty 1

## 2024-01-11 MED ORDER — ACETAMINOPHEN 500 MG PO TABS
1000.0000 mg | ORAL_TABLET | Freq: Once | ORAL | Status: AC
  Administered 2024-01-11: 1000 mg via ORAL
  Filled 2024-01-11: qty 2

## 2024-01-11 NOTE — ED Provider Notes (Signed)
 [place] EMERGENCY DEPARTMENT AT [facility] Provider Note   CSN: [id number] Arrival date & time: 01/11/24  9146     Patient presents with: Chelsea Bell   Chelsea Bell is a 68 y.o. female.  She presents the ER today via EMS for evaluation of injury due to fall.  She was walking out of her bathroom and tripped over a stool that was on the floor.  Denies dizziness or LOC, she fell into her hallway and struck the edge of the door frame with her head and fell on her left side injuring left shoulder and left knee.  She has had headache with a goose egg on the left side of her head as well as left shoulder and left knee pain, denies chest pain or abdominal pain, no neck or back pain.  No numbness tingling or weakness.  She is not on blood thinners.    Fall       Prior to Admission medications   Medication Sig Start Date End Date Taking? Authorizing Provider  fluticasone  (FLONASE ) 50 MCG/ACT nasal spray Place 1 spray into both nostrils 2 (two) times daily. 02/28/23   Stuart Vernell Norris, PA-C  MYRBETRIQ 50 MG TB24 tablet Take 50 mg by mouth daily.    [provider]  OVER THE COUNTER MEDICATION Take 1 each by mouth every morning. Onguard oil (doterra brand) --puts a drop on tongue    [provider]  OVER THE COUNTER MEDICATION Take 1 drop by mouth every morning. Frankinsense (doterra Brand) --one drop on tongue.    [provider]  pantoprazole (PROTONIX) 40 MG tablet Take 40 mg by mouth daily.    [provider]    Allergies: Dilaudid [hydromorphone hcl], Percocet [oxycodone-acetaminophen ], Amoxicillin, Codeine, and Nickel    Review of Systems  Updated Vital Signs Pulse 67   Temp 97.6 F (36.4 C) (Oral)   Resp 15   Ht 5' 6 (1.676 m)   Wt 13.6 kg   SpO2 97%   BMI 4.84 kg/m   Physical Exam Vitals and nursing note reviewed.  Constitutional:      General: She is not in acute distress.    Appearance: She is well-developed.  HENT:      Head: Normocephalic.     Comments: Left frontal hematoma  Eyes:     Conjunctiva/sclera: Conjunctivae normal.    Cardiovascular:     Rate and Rhythm: Normal rate and regular rhythm.     Heart sounds: No murmur heard. Pulmonary:     Effort: Pulmonary effort is normal. No respiratory distress.     Breath sounds: Normal breath sounds.  Abdominal:     Palpations: Abdomen is soft.     Tenderness: There is no abdominal tenderness.   Musculoskeletal:        General: No swelling.     Cervical back: Neck supple.   Skin:    General: Skin is warm and dry.     Capillary Refill: Capillary refill takes less than 2 seconds.   Neurological:     Mental Status: She is alert.   Psychiatric:        Mood and Affect: Mood normal.     (all labs ordered are listed, but only abnormal results are displayed) Labs Reviewed - No data to display  EKG: None  Radiology: No results found.   Procedures   Medications Ordered in the ED  acetaminophen  (TYLENOL ) tablet 1,000 mg (has no administration in time range)  Medical Decision Making Differential diagnosis includes but not limited to intracranial hemorrhage, concussion, fracture, dislocation, other  ED course: Patient presents to ER for evaluation of head injury, left shoulder and left knee pain after fall.  She tripped over a stool and fell down today.  She is on blood thinners.  No LOC.  Primary complaint is a headache with a bump on the left side of her head as well as left shoulder and knee pain.  She is able to stand up and ambulate after the fall.    She has no neurologic deficits, no nausea or vomiting.  She does complain that if she is looking at her phone for too long the words look a little bit fuzzy.  CT head and C-spine are both negative, shoulder x-ray and left knee x-ray negative.  Patient does not have any left AC tenderness and no limited range of motion.  She likely has a shoulder  strain.     Her left knee has normal range of motion, mild anterior bruising, no effusion.  I do not feel she needs knee immobilizer or further imaging at this time since she is able to ambulate.  She was given Tylenol  and Toradol for pain and had improvement of her symptoms.    On reassessment she has had no worsening of her symptoms, feel she is stable to go home.  She was advised on brain rest for concussion, shoulder range of motion exercises and sling as needed for the left shoulder pain.    Amount and/or Complexity of Data Reviewed Radiology: ordered and independent interpretation performed.    Details: CT head-left frontal hematoma no intracranial hemorrhage or skull fracture CT C-spine-no traumatic alignment no fracture X-ray left shoulder no fracture or dislocation X-ray left knee-no bony abnormalities, no effusion  I agree with radiology read  Risk OTC drugs. Prescription drug management.        Final diagnoses:  None    ED Discharge Orders     None          Suellen Sherran DELENA DEVONNA 01/11/24 1149    Kommor, Karns, MD 01/12/24 1156

## 2024-01-11 NOTE — Discharge Instructions (Signed)
 Is a pleasure taking care of you today.  You are seen after a fall.  Fortunately all of your imaging was negative.  You take over-the-counter Tylenol  and ibuprofen as needed for discomfort.  You can use ice to help with swelling and pain as well.  Use the sling as needed for shoulder pain to help you rest but make sure you perform range of motion exercises several times a day as discussed.  Follow-up closely with your PCP.  If you have any continued pain in your shoulder and knee please follow-up with orthopedic doctor.  Come back to to the ER if you have new or worsening symptoms.

## 2024-01-11 NOTE — ED Triage Notes (Signed)
 Patient BIB [facility] from home for complaint of a fall. Clemens coming out of her bathroom over a stool that in the room, stated she hit her head on left forehead, denise loss of consciousness, blurry vision, nausea and vomiting. Denise blood thinner, but takes a natural blood thinner that she take over the counter. Left shoulder pain.

## 2024-01-13 ENCOUNTER — Telehealth: Payer: Self-pay | Admitting: Orthopedic Surgery

## 2024-01-13 NOTE — Telephone Encounter (Signed)
 This patient was seen in the ED on 6/22, lt shoulder, you are full then off, where would you like me to schedule her?

## 2024-01-14 ENCOUNTER — Ambulatory Visit: Admitting: Physical Therapy

## 2024-01-14 DIAGNOSIS — R279 Unspecified lack of coordination: Secondary | ICD-10-CM

## 2024-01-14 DIAGNOSIS — M6281 Muscle weakness (generalized): Secondary | ICD-10-CM

## 2024-01-14 DIAGNOSIS — R293 Abnormal posture: Secondary | ICD-10-CM

## 2024-01-14 NOTE — Therapy (Signed)
 OUTPATIENT PHYSICAL THERAPY FEMALE PELVIC TREATMENT   Patient Name: Chelsea Bell MRN: [id number] DOB:1955/11/07, 68 y.o., female Today's Date: 01/14/2024  END OF SESSION:  PT End of Session - 01/14/24 1233     Visit Number 8    Number of Visits 10    Authorization Type BCBS Medicare    PT Start Time 1145    PT Stop Time 1230    PT Time Calculation (min) 45 min    Activity Tolerance Patient tolerated treatment well    Behavior During Therapy WFL for tasks assessed/performed                 Past Medical History:  Diagnosis Date   Cancer (HCC)    endometrial cancer- surgery only   GERD (gastroesophageal reflux disease)    Past Surgical History:  Procedure Laterality Date   ABDOMINAL HYSTERECTOMY     COLONOSCOPY WITH PROPOFOL  N/A 06/09/2014   Procedure: COLONOSCOPY WITH PROPOFOL ;  Surgeon: Lamar Donnald GAILS, MD;  Location: [facility];  Service: Endoscopy;  Laterality: N/A;   DILATION AND CURETTAGE OF UTERUS     LAPAROSCOPIC OVARIAN     diagnostic with 1 ovary removed , then Hysterectomy(endometrial cancer) with other ovary removed   TONSILLECTOMY     Patient Active Problem List   Diagnosis Date Noted   Overactive bladder 10/29/2023   SUI (stress urinary incontinence, female) 10/29/2023   Chest pain 10/07/2021    PCP: Marvine Rush, MD   REFERRING PROVIDER: [name], MD   REFERRING DIAG: N32.81 (ICD-10-CM) - Overactive bladder N39.3 (ICD-10-CM) - SUI (stress urinary incontinence, female)  THERAPY DIAG:  Muscle weakness (generalized)  Unspecified lack of coordination  Abnormal posture  Rationale for Evaluation and Treatment: Rehabilitation  ONSET DATE: unknown   SUBJECTIVE:                                                                                                                                                                                           SUBJECTIVE STATEMENT: kathy Patient reports that she tripped over the  stool in her bathroom on Sunday morning and fell. She hit her head and has a bruise on your left temporal region. She went to urgent care and had imaging done - she had no brain bleed, but does have a concussion. Her findings indicate: left frontal scalp hematoma, no underlying skull fracture, no evidence for cervical spine fracture or subluxation. She has been doing well in the bladder department - she has not been leaking and has more control of her urgency. No pelvic pain to report or bowel concerns to report.   Eval: Patient  reports to pelvic PT with urinary incontinence that has been getting progressively worse for many years. Some days she wears 2 pads, some days she has full bladder loss and requires more frequent changes. The pads she uses usually only catch 4-5 drops of urine when she does leak. No bowel related concerns to report. No pelvic pain to report.  Fluid intake: is trying to increase her water intake, she is trying to cut back on soda/caffeine   PAIN:  Are you having pain? No NPRS scale: 0/10  PRECAUTIONS: None  RED FLAGS: None   WEIGHT BEARING RESTRICTIONS: No  FALLS:  Has patient fallen in last 6 months? 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, is a Facilities manager   ACTIVITY LEVEL : not currently exercising consistently   PLOF: Independent  PATIENT GOALS: to decrease urinary leakage and control bladder better   PERTINENT HISTORY:  Has plantar fasciitis and she uses a cane to help her ambulate  Hx: GERD, endometrial cancer (surgery), abdominal hysterectomy, ovarian removal  Sexual abuse: No  BOWEL MOVEMENT: Pain with bowel movement: No Type of bowel movement:Type (Bristol Stool Scale) 4, Frequency 1x/day, Strain no, and Splinting no Fully empty rectum: Yes:   Leakage: No Pads: No Fiber supplement/laxative No  URINATION: Pain with urination: No Fully empty bladder: Noshe feels like she empties, but there is some residual voiding volume   Stream: Weak Urgency: Yes  Frequency: feels like she goes all the time - goes 1-3x/night  Leakage: Urge to void, Walking to the bathroom, Coughing, Sneezing, and Laughing Pads: Yes: long pads 4 or 5 drops   INTERCOURSE: not currently sexually active   PREGNANCY: Vaginal deliveries 0 C-section deliveries 0  PROLAPSE: None  OBJECTIVE:  Note: Objective measures were completed at Evaluation unless otherwise noted.  PATIENT SURVEYS: PFIQ-7: 14  COGNITION: Overall cognitive status: Within functional limits for tasks assessed     SENSATION: Light touch: Appears intact  LUMBAR SPECIAL TESTS:  Single leg stance test: Positive  FUNCTIONAL TESTS:  Squat: mild dynamic knee valgus bilaterally with sit to stand transfer   GAIT: Assistive device utilized: Single point cane Comments: moderate trendelenburg gait pattern with ambulation   POSTURE: rounded shoulders, forward head, increased thoracic kyphosis, and flexed trunk    LUMBARAROM/PROM: within functional limits   A/PROM A/PROM  eval  Flexion   Extension   Right lateral flexion   Left lateral flexion   Right rotation   Left rotation    (Blank rows = not tested)  LOWER EXTREMITY ROM: within functional limits   Active ROM Right eval Left eval  Hip flexion    Hip extension    Hip abduction    Hip adduction    Hip internal rotation    Hip external rotation    Knee flexion    Knee extension    Ankle dorsiflexion    Ankle plantarflexion    Ankle inversion    Ankle eversion     (Blank rows = not tested)  LOWER EXTREMITY MMT: 4-/5 bilateral knees and hips grossly   MMT Right eval Left eval  Hip flexion    Hip extension    Hip abduction    Hip adduction    Hip internal rotation    Hip external rotation    Knee flexion    Knee extension    Ankle dorsiflexion    Ankle plantarflexion    Ankle inversion    Ankle eversion     (Blank rows =  not tested) PALPATION:   General: no significant tenderness  to palpation at bilateral adductors or hip flexors   Pelvic Alignment: within normal limits   Abdominal: decreased rib excursion with inhalation, abdominal bracing at rest, upper chest breathing at rest                External Perineal Exam: mild dryness noted with sufficient clitoral hood mobility present                               Internal Pelvic Floor: Patient fully consents to today's internal examination. She demonstrates a stiffness throughout the superficial and deep layers of the pelvic floor bilaterally, but no tenderness to palpation of superficial or deep musculature throughout. Patient demonstrates a lack of coordination in the pelvic floor with inhalation/exhalation. Following internal cueing, patient was able to actively lengthen and shorten the pelvic floor with her breathing techniques. Repetitive pelvic floor contractions were challenging for the patient.   Patient confirms identification and approves PT to assess internal pelvic floor and treatment Yes No emotional/communication barriers or cognitive limitation. Patient is motivated to learn. Patient understands and agrees with treatment goals and plan. PT explains patient will be examined in standing, sitting, and lying down to see how their muscles and joints work. When they are ready, they will be asked to remove their underwear so PT can examine their perineum. The patient is also given the option of providing their own chaperone as one is not provided in our facility. The patient also has the right and is explained the right to defer or refuse any part of the evaluation or treatment including the internal exam. With the patient's consent, PT will use one gloved finger to gently assess the muscles of the pelvic floor, seeing how well it contracts and relaxes and if there is muscle symmetry. After, the patient will get dressed and PT and patient will discuss exam findings and plan of care. PT and patient discuss plan of care,  schedule, attendance policy and HEP activities.  PELVIC MMT:   MMT eval  Vaginal 3/5 manual muscle test score, 5 quick flicks that were challenging, 0 second hold  Internal Anal Sphincter   External Anal Sphincter   Puborectalis   Diastasis Recti   (Blank rows = not tested)        TONE: Within normal limits   PROLAPSE: No significant anterior or posterior vaginal wall laxity present   TODAY'S TREATMENT:                                                                                                                              DATE:   12/31/23: Neuro re-ed: standing diaphragmatic breathing + pelvic floor lengthening + shortening with inhalation/exhalation 2x10  Seated quick flick contractions + diaphragmatic breathing 2x10  Seated pelvic floor contraction paired with cough 2x10  Standing plie with pelvic floor contraction +  diaphragmatic breathing 2x10  Therapeutic exercise:  Step up + diaphragmatic breathing 2x6 each  Standing hip abduction with 2# ankle weights + diaphragmatic breathing 2x10  Standing marching at counter with 2# ankle weights + diaphragmatic breathing 2x10  Self care: Relative pelvic floor anatomy and the connection between the diaphragm and pelvic floor, intraabdominal pressure management and how this affects our pelvic floor musculature Knack technique for stress urinary incontinence  Toileting mechanics and techniques to fully empty rectum when passing bowel movements   01/07/24: Neuro re-ed: standing diaphragmatic breathing + pelvic floor lengthening + shortening with inhalation/exhalation 2x10  Sit to stand with kegel at top + diaphragmatic breathing 2x8  Standing plie with pelvic floor contraction + diaphragmatic breathing 2x10  Sidelying clam (RTB) + reverse clamshell + diaphragmatic breathing 2x8  Therapeutic exercise:  Step up + diaphragmatic breathing 2x6 each  Standing hip abduction with RTB around ankles + diaphragmatic breathing 2x10  Standing  marching at counter with 2# ankle weights + diaphragmatic breathing 2x10  Self care: Relative pelvic floor anatomy and the connection between the diaphragm and pelvic floor, intraabdominal pressure management and how this affects our pelvic floor musculature Knack technique for stress urinary incontinence  Toileting mechanics and techniques to fully empty rectum when passing bowel movements   01/14/24: Neuro re-ed: standing diaphragmatic breathing + pelvic floor lengthening + shortening with inhalation/exhalation 2x10  seated diaphragmatic breathing + pelvic floor lengthening + shortening with inhalation/exhalation 2x10  Self care: Relative pelvic floor anatomy and the connection between the diaphragm and pelvic floor, intraabdominal pressure management and how this affects our pelvic floor musculature Knack technique for stress urinary incontinence  Toileting mechanics and techniques to fully empty rectum when passing bowel movements  In the meantime while you recover from your concussion: Hold off on all pelvic floor exercises other than these:  Standing/seated pelvic floor contractions: Range of motion training: 2x10  Sit or stand with your hands on your stomach. Inhale as your tummy fills with air like a balloon filling up, and your pelvic floor relaxes. Exhale as you perform a kegel while you blow out.  Quick flicks: 2x10  Sit or stand and perform 10 quick kegels in a row, making sure you rest between each contraction  Rest as much as needed, avoid high intensity movement or exercise, limit screen time if you can, stay extra hydrated during this time   PATIENT EDUCATION:  Education details: Relative pelvic floor anatomy and the connection between the diaphragm and pelvic floor, intraabdominal pressure management and how this affects our pelvic floor musculature Person educated: Patient Education method: Explanation, Demonstration, Tactile cues, Verbal cues, and Handouts Education  comprehension: verbalized understanding, returned demonstration, verbal cues required, tactile cues required, and needs further education  HOME EXERCISE PROGRAM: Access Code: [email or web address] URL: [email or web address] Date: 01/07/2024 Prepared by: Celena Domino  Exercises - Standing Pelvic Floor Contraction  - 1 x daily - 7 x weekly - 2 sets - 10 reps - Standing Plie With Pelvic Floor Contraction  - 1 x daily - 7 x weekly - 2 sets - 10 reps - Sit to Stand Without Arm Support  - 1 x daily - 7 x weekly - 2 sets - 8-10 reps - Clam with Resistance  - 1 x daily - 7 x weekly - 2 sets - 10 reps - Sidelying Reverse Clamshell  - 1 x daily - 7 x weekly - 2 sets - 10 reps - Supine Bridge with Resistance Band  - 1 x daily -  7 x weekly - 2 sets - 10 reps - Seated Hip Abduction  - 1 x daily - 7 x weekly - 2 sets - 10 reps - Standing Hip Abduction with Resistance at Ankles and Counter Support  - 1 x daily - 7 x weekly - 2 sets - 10 reps - Standing Marching  - 1 x daily - 7 x weekly - 2 sets - 10 reps - Step Up  - 1 x daily - 7 x weekly - 2 sets - 5 reps  ASSESSMENT:  CLINICAL IMPRESSION: Patient is a 68 y.o. female  who was seen today for physical therapy treatment for stress incontinence. Patient had a fall since last visit and has a concussion, so no exercises were progressed today. We reviewed seated pelvic floor training to maintain progress. No pain following end of session. No pain or leakage following today's session and Pt would benefit from additional PT to further address deficits.    OBJECTIVE IMPAIRMENTS: decreased coordination, decreased endurance, decreased mobility, decreased ROM, and decreased strength.   ACTIVITY LIMITATIONS: continence  PARTICIPATION LIMITATIONS: N/A  PERSONAL FACTORS: Age, Past/current experiences, and Time since onset of injury/illness/exacerbation are also affecting patient's functional outcome.   REHAB POTENTIAL: Good  CLINICAL DECISION MAKING:  Stable/uncomplicated  EVALUATION COMPLEXITY: Low   GOALS: Goals reviewed with patient? Yes  SHORT TERM GOALS: Target date: 11/28/2023  Pt will be independent with HEP.  Baseline: Goal status: INITIAL  2.  Pt will be independent with the knack, urge suppression technique, and double voiding in order to improve bladder habits and decrease urinary incontinence.   Baseline:  Goal status: INITIAL  3.  Pt will report no leaks with laughing, coughing, sneezing in order to improve comfort with interpersonal relationships and community activities so that she does not have to change pads multiple times per day. Baseline:  Goal status: INITIAL  LONG TERM GOALS: Target date: 05/01/2024  Pt will be independent with advanced HEP.  Baseline:  Goal status: INITIAL  2.  Pt to demonstrate improved coordination of pelvic floor and breathing mechanics with 10# squat with appropriate synergistic patterns to decrease pain and leakage at least 75% of the time.   Baseline:  Goal status: INITIAL  3.  Pt will be able to lift at least 10 lb correctly for 10 reps without pain or leakage for functional activities to allow patient to pick up weighted objects in public without leaking urine.  Baseline:  Goal status: INITIAL  4.  Pt will demonstrate increase in all impaired hip strength by 1 muscle grades in order to demonstrate improved lumbopelvic support and increase functional ability.   Baseline:  Goal status: INITIAL  PLAN:  PT FREQUENCY: 1-2x/week  PT DURATION: 6 months  PLANNED INTERVENTIONS: 97110-Therapeutic exercises, 97530- Therapeutic activity, 97112- Neuromuscular re-education, 97535- Self Care, [id number]- Manual therapy, Taping, Dry Needling, Joint mobilization, Spinal mobilization, Scar mobilization, Cryotherapy, and Moist heat  PLAN FOR NEXT SESSION: continued pelvic floor AROM training in seated, introduce hip and core strengthening, introduce downtraining stretches   Celena JAYSON Domino,  PT 01/14/2024, 12:33 PM

## 2024-01-14 NOTE — Patient Instructions (Signed)
 In the meantime while you recover from your concussion: Hold off on all pelvic floor exercises other than these:  Standing/seated pelvic floor contractions: Range of motion training: 2x10  Sit or stand with your hands on your stomach. Inhale as your tummy fills with air like a balloon filling up, and your pelvic floor relaxes. Exhale as you perform a kegel while you blow out.  Quick flicks: 2x10  Sit or stand and perform 10 quick kegels in a row, making sure you rest between each contraction  Rest as much as needed, avoid high intensity movement or exercise, limit screen time if you can, stay extra hydrated during this time  When you are sitting at home, try the following exercises to help your cervical/thoracic spinal region: Chin tucks x10  Head nods forward and back x10 (slowly) Head tilts side to side x10 (slowly)  Head turns side to side x10 (slowly) Shoulder shrugs up and down x10 (slowly) Shoulder rolls forward and backward x10 (slowly) Scapular squeezes x10 (slowly)

## 2024-01-16 ENCOUNTER — Ambulatory Visit: Admitting: Orthopedic Surgery

## 2024-01-16 VITALS — BP 135/71 | HR 59 | Wt 308.6 lb

## 2024-01-16 DIAGNOSIS — M25512 Pain in left shoulder: Secondary | ICD-10-CM

## 2024-01-18 ENCOUNTER — Encounter: Payer: Self-pay | Admitting: Orthopedic Surgery

## 2024-01-18 NOTE — Progress Notes (Signed)
 New Patient Visit  Assessment: Chelsea Bell is a 68 y.o. female with the following: 1. Acute pain of left shoulder  Plan: Chelsea Bell has pain in the left shoulder, as well as the left knee after a fall.  She is feeling better since she was seen in the emergency department.  She has no concerns at this time.  She does continue to have symptoms consistent with a concussion.  I provided reassurance.  If she continues to have issues with the left shoulder, or the bruising about the left knee, she will contact the clinic.  Follow-up: Return if symptoms worsen or fail to improve.  Subjective:  Chief Complaint  Patient presents with   Shoulder Pain    Left shouldre pain, fell Sunday went ED. Had Xrays done.    left knee pain    Left knee pain fell Sunday went to ED. Xrays were done.     History of Present Illness: Chelsea Bell is a 68 y.o. female who presents for evaluation of left shoulder and left knee pain.  Almost a week ago, she fell at home.  When she fell, her head hit a door frame.  She had a lot of pain in the superior aspect the left shoulder, as well as the anterior aspect of the left knee.  Since being evaluated in the emergency department, the pain is improved.  She is not concerned about her motion or pain in the left shoulder.  It was in the superior aspect of the shoulder, but is much better.  She has some tenderness to the left knee, primarily in the anterior aspect.  There is a lot of bruising in this area.  She does occasionally have a headache since the fall.  She is taking medications intermittently.   Review of Systems: No fevers or chills No numbness or tingling No chest pain No shortness of breath No bowel or bladder dysfunction No GI distress + headaches   Medical History:  Past Medical History:  Diagnosis Date   Cancer (HCC)    endometrial cancer- surgery only   GERD (gastroesophageal reflux disease)     Past Surgical History:   Procedure Laterality Date   ABDOMINAL HYSTERECTOMY     COLONOSCOPY WITH PROPOFOL  N/A 06/09/2014   Procedure: COLONOSCOPY WITH PROPOFOL ;  Surgeon: Lamar Donnald GAILS, MD;  Location: [facility];  Service: Endoscopy;  Laterality: N/A;   DILATION AND CURETTAGE OF UTERUS     LAPAROSCOPIC OVARIAN     diagnostic with 1 ovary removed , then Hysterectomy(endometrial cancer) with other ovary removed   TONSILLECTOMY      Family History  Problem Relation Age of Onset   Heart failure Mother    Hyperlipidemia Mother    Diabetes Mother    Hypertension Mother    Thyroid  disease Mother    Osteoporosis Mother    Arthritis Mother    Cancer Father        Ear   Cerebrovascular Accident Father    Melanoma Father    Parkinson's disease Father    Stroke Father    Neuropathy Father    Hyperlipidemia Sister    Asthma Sister    Hypertension Sister    Irritable bowel syndrome Sister    Cancer Paternal Uncle    Diabetes Maternal Grandmother    Arthritis Maternal Grandmother    Osteoporosis Maternal Grandmother    Heart disease Paternal Grandfather    Breast cancer Neg Hx    Social History  Tobacco Use   Smoking status: Never   Smokeless tobacco: Never  Vaping Use   Vaping status: Never Used  Substance Use Topics   Alcohol use: No   Drug use: No    Allergies  Allergen Reactions   Dilaudid [Hydromorphone Hcl] Itching   Percocet [Oxycodone-Acetaminophen ] Itching   Amoxicillin Itching    + headache.    Codeine Itching    + Headache.    Nickel Dermatitis    itching, skin blisters    Current Meds  Medication Sig   fluticasone  (FLONASE ) 50 MCG/ACT nasal spray Place 1 spray into both nostrils 2 (two) times daily.   MYRBETRIQ 50 MG TB24 tablet Take 50 mg by mouth daily.   OVER THE COUNTER MEDICATION Take 1 each by mouth every morning. Onguard oil (doterra brand) --puts a drop on tongue   OVER THE COUNTER MEDICATION Take 1 drop by mouth every morning. Frankinsense (doterra Brand)  --one drop on tongue.   pantoprazole (PROTONIX) 40 MG tablet Take 40 mg by mouth daily.    Objective: BP 135/71   Pulse (!) 59   Wt (!) 308 lb 9.6 oz (140 kg)   BMI 49.81 kg/m   Physical Exam:  General: Alert and oriented. and No acute distress. Gait: Normal gait.  Left shoulder without deformity.  Minimal bruising.  Mild tenderness to palpation within the trapezius.  She has near full range of motion.  No pain with strength testing.  Sensation intact throughout the left upper extremity.  Evaluation of left knee demonstrates obvious bruising and swelling to the anterior aspect of the knee.  Mild tenderness to palpation is appreciated.  She has full range of motion.  No increased laxity varus or valgus stress.  Negative Lachman.  IMAGING: I personally reviewed images previously obtained from the ED  X-ray of the left knee and the left shoulder were available clinic today.  No acute injuries.  Negative x-rays overall.   New Medications:  No orders of the defined types were placed in this encounter.     Oneil DELENA Horde, MD  01/18/2024 10:07 PM

## 2024-01-19 NOTE — Therapy (Signed)
 OUTPATIENT PHYSICAL THERAPY LOWER EXTREMITY EVALUATION   Patient Name: Chelsea Bell MRN: [id number] DOB:12-19-1955, 68 y.o., female Today's Date: 01/20/2024  END OF SESSION:  PT End of Session - 01/20/24 1706     Visit Number 10    Authorization Type [employer] Medicare    Progress Note Due on Visit 10    PT Start Time 1102    PT Stop Time 1150    PT Time Calculation (min) 48 min    Activity Tolerance Patient tolerated treatment well    Behavior During Therapy WFL for tasks assessed/performed          Past Medical History:  Diagnosis Date   Cancer (HCC)    endometrial cancer- surgery only   GERD (gastroesophageal reflux disease)    Past Surgical History:  Procedure Laterality Date   ABDOMINAL HYSTERECTOMY     COLONOSCOPY WITH PROPOFOL  N/A 06/09/2014   Procedure: COLONOSCOPY WITH PROPOFOL ;  Surgeon: Lamar Donnald GAILS, MD;  Location: [facility];  Service: Endoscopy;  Laterality: N/A;   DILATION AND CURETTAGE OF UTERUS     LAPAROSCOPIC OVARIAN     diagnostic with 1 ovary removed , then Hysterectomy(endometrial cancer) with other ovary removed   TONSILLECTOMY     Patient Active Problem List   Diagnosis Date Noted   Overactive bladder 10/29/2023   SUI (stress urinary incontinence, female) 10/29/2023   Chest pain 10/07/2021    PCP: Marvine Rush, MD  REFERRING PROVIDER: Marvine Rush, MD  REFERRING DIAG: Rt 26.7 Gait training  THERAPY DIAG:  Chronic pain of both knees  Muscle weakness (generalized)  Other low back pain  Rationale for Evaluation and Treatment: Rehabilitation  ONSET DATE:  01/11/2024  SUBJECTIVE:   SUBJECTIVE STATEMENT: Patient presents with pain in her feet and ankles. June 22 she had a bad fall. She had stool in her bathroom and she tripped over it and she hit her head on the door frame. She has multiple bruises on her head, knee, and ankle. Patient denies a brain bleed and any broken bones. She had a follow up with the orthopedic doctor  and she has multiple appointments coming up. She has a concussion and does not get frequent headaches but she gets occasional twinges and sharp pains. She still get very fatigued and she wants to rest majority of the day. Her left knee is her bad knee. She does not use her straight cane in her house, but she used out in the community [employer], appointments etc). She sometimes uses the scooter when she grocery shopes. Her goal is to not use the straight cane. She feels she is too young to be using a straight cane.   PERTINENT HISTORY: Currently in pelvic therapy; hx cancer PAIN:  Are you having pain? Soreness in her shoulders. She has low back pain and occasional knee pain if she walks a lot.  PRECAUTIONS: Fall and Other: Concussion   RED FLAGS: None   WEIGHT BEARING RESTRICTIONS: No  FALLS:  Has patient fallen in last 6 months? Yes. Number of falls 1 fall 01/11/2024 see details above  LIVING ENVIRONMENT: Lives with: lives alone Lives in: House/apartment Stairs: Yes: Internal: 3.5 steps; on left going up Has following equipment at home: Single point cane, Shower bench, and Grab bars  OCCUPATION: Not currently working  PLOF: Independent, Independent with basic ADLs, Independent with household mobility without device, Independent with community mobility with device, Independent with gait, Independent with transfers, and Leisure: teaches painting classes   PATIENT GOALS:  To get rid of straight cane; To be able to walk up the steps normally; to be more mobile   NEXT MD VISIT: This week with PCP  OBJECTIVE:  Note: Objective measures were completed at Evaluation unless otherwise noted.  DIAGNOSTIC FINDINGS: Patient had imaging done on 01/11/2024 after fall. Head CT, CT C-spine, Knee, and shoulder imaging was normal no concerns.  PATIENT SURVEYS:  LEFS: 38/80 47.5    COGNITION: Overall cognitive status: Within functional limits for tasks  assessed     SENSATION: WFL    POSTURE: rounded shoulders and forward head    LOWER EXTREMITY ROM: WFL bilateral    LOWER EXTREMITY MMT: Grossly 4/5 bilateral     FUNCTIONAL TESTS:  5 times sit to stand: 13.80 sec ; hands on knees; some pain in right hip Timed up and go (TUG): 12.48 sec no straight cane; 11.40 sec with straight cane  GAIT:  Assistive device utilized: Single point cane Comments: antalgic gait                                                                                                                                TREATMENT DATE:  01/20/2024 Initial Evaluation & HEP created  Concussion protocol; returning to the gym; benefits of aquatics therapy  PATIENT EDUCATION:  Education details: PT eval findings, anticipated POC, progress with PT, and initial HEP Person educated: Patient Education method: Explanation, Demonstration, and Handouts Education comprehension: verbalized understanding, returned demonstration, and needs further education  HOME EXERCISE PROGRAM: Access Code: [email or web address] URL: [email or web address] Date: 01/20/2024 Prepared by: Kristeen Sar  Exercises - Seated Long Arc Quad  - 1 x daily - 7 x weekly - 2 sets - 10 reps - 2 hold - Long Sitting Ankle Pumps  - 1 x daily - 7 x weekly - 2 sets - 10 reps - Seated Heel Raise  - 1 x daily - 7 x weekly - 2 sets - 10 reps - Seated Ankle Alphabet  - 1 x daily - 7 x weekly - 1 sets - 10 reps  ASSESSMENT:  CLINICAL IMPRESSION: Patient is a 68 y.o. female who was seen today for physical therapy evaluation and treatment for gait training. Nathanel presents with chronic bilateral foot and ankle that was re aggravated January 11, 2024 after a fall. She fell in her bathroom and she hit her head on the door threshold. She had imaging down and everything came back unremarkable. She does have a concussion and she is limited in her able to bend and pick up items and she gets occasional sharp pains in  her head. She denies frequent headaches. She goal is to initial a gym routine and stay mobile. Patient would benefit from some sessions of aquatic therapy to establish an aquatic program to perform on her own. Patient is motivated and wants to get better. Patient will benefit from skilled PT to address the below impairments and improve overall function.  OBJECTIVE IMPAIRMENTS: Abnormal gait, decreased balance, decreased endurance, difficulty walking, decreased ROM, decreased strength, increased muscle spasms, prosthetic dependency , and pain.   ACTIVITY LIMITATIONS: carrying, lifting, bending, standing, squatting, stairs, transfers, and locomotion level  PARTICIPATION LIMITATIONS: meal prep, cleaning, laundry, interpersonal relationship, shopping, community activity, and church  PERSONAL FACTORS: Age, Fitness, and Time since onset of injury/illness/exacerbation are also affecting patient's functional outcome.   REHAB POTENTIAL: Good  CLINICAL DECISION MAKING: Evolving/moderate complexity  EVALUATION COMPLEXITY: Moderate   GOALS: Goals reviewed with patient? Yes  SHORT TERM GOALS: Target date: 02/17/2024  Patient will be independent with initial HEP. Baseline:  Goal status: INITIAL  2.  Patient will be able to participant a to establish a baseline for functional test. Baseline:  Goal status: INITIAL   3.  Patient will perform 5STS in < or = to 12 sec for improved functional mobility. Baseline:  Goal status: INITIAL    LONG TERM GOALS: Target date: 03/16/2024  Patient will demonstrate independence in advanced HEP. Baseline:  Goal status: INITIAL  2.  Patient will report > or = to 50% improvement in ankle and foot pain since starting PT. Baseline:  Goal status: INITIAL  3.  Patient will verbalize and demonstrate self-care strategies to manage pain including tissue mobility practices and change of position. Baseline:  Goal status: INITIAL  4.  Patient will score  > or = to 48/80 on LEFS due to improved function. Baseline: 38/80 Goal status: INITIAL  5.  Patient will be able to stand long enough to wash dishes with < or = to 2/10 back pain. Baseline: has to take rests Goal status: INITIAL  6.  Patient will demonstrate proper posture when bending and lifting to prevent back injury. Baseline:  Goal status: INITIAL   PLAN:  PT FREQUENCY: 2x/week  PT DURATION: 8 weeks  PLANNED INTERVENTIONS: 97164- PT Re-evaluation, 97110-Therapeutic exercises, 97530- Therapeutic activity, 97112- Neuromuscular re-education, 97535- Self Care, [id number]- Manual therapy, [phone]- Gait training, [phone]- Canalith repositioning, [id number]- Aquatic Therapy, [phone]- Electrical stimulation (unattended), [phone]- Electrical stimulation (manual), [id number]- Vasopneumatic device, [id number]- Ultrasound, [id number]- Ionotophoresis 4mg /ml Dexamethasone , 79439 (1-2 muscles), [id number] (3+ muscles)- Dry Needling, Patient/Family education, Balance training, Stair training, Taping, Joint mobilization, Joint manipulation, Spinal manipulation, Spinal mobilization, Vestibular training, Cryotherapy, and Moist heat  PLAN FOR NEXT SESSION: Review HEP; 3/6 MWT; LE strengthening; ankle strengthening & ROM; core strengthening    Kristeen Sar, PT 01/20/24 5:07 PM [facility] [address] [place], [place] [address] Phone # [phone] Fax [phone]

## 2024-01-20 ENCOUNTER — Ambulatory Visit: Attending: Obstetrics and Gynecology | Admitting: Physical Therapy

## 2024-01-20 ENCOUNTER — Ambulatory Visit: Attending: Family Medicine | Admitting: Physical Therapy

## 2024-01-20 ENCOUNTER — Other Ambulatory Visit: Payer: Self-pay

## 2024-01-20 ENCOUNTER — Encounter: Payer: Self-pay | Admitting: Physical Therapy

## 2024-01-20 DIAGNOSIS — M6281 Muscle weakness (generalized): Secondary | ICD-10-CM | POA: Insufficient documentation

## 2024-01-20 DIAGNOSIS — R279 Unspecified lack of coordination: Secondary | ICD-10-CM | POA: Insufficient documentation

## 2024-01-20 DIAGNOSIS — M25561 Pain in right knee: Secondary | ICD-10-CM | POA: Insufficient documentation

## 2024-01-20 DIAGNOSIS — R293 Abnormal posture: Secondary | ICD-10-CM | POA: Insufficient documentation

## 2024-01-20 DIAGNOSIS — M5459 Other low back pain: Secondary | ICD-10-CM | POA: Insufficient documentation

## 2024-01-20 DIAGNOSIS — G8929 Other chronic pain: Secondary | ICD-10-CM | POA: Diagnosis present

## 2024-01-20 DIAGNOSIS — M25562 Pain in left knee: Secondary | ICD-10-CM | POA: Insufficient documentation

## 2024-01-20 NOTE — Patient Instructions (Signed)

## 2024-01-20 NOTE — Therapy (Signed)
 OUTPATIENT PHYSICAL THERAPY FEMALE PELVIC TREATMENT   Patient Name: Chelsea Bell MRN: [id number] DOB:06-13-1956, 68 y.o., female Today's Date: 01/20/2024  END OF SESSION:  PT End of Session - 01/20/24 1229     Visit Number 9    Number of Visits 10    Authorization Type [employer] Medicare    PT Start Time 1145    PT Stop Time 1230    PT Time Calculation (min) 45 min    Activity Tolerance Patient tolerated treatment well    Behavior During Therapy WFL for tasks assessed/performed                  Past Medical History:  Diagnosis Date   Cancer (HCC)    endometrial cancer- surgery only   GERD (gastroesophageal reflux disease)    Past Surgical History:  Procedure Laterality Date   ABDOMINAL HYSTERECTOMY     COLONOSCOPY WITH PROPOFOL  N/A 06/09/2014   Procedure: COLONOSCOPY WITH PROPOFOL ;  Surgeon: Lamar Donnald GAILS, MD;  Location: [facility];  Service: Endoscopy;  Laterality: N/A;   DILATION AND CURETTAGE OF UTERUS     LAPAROSCOPIC OVARIAN     diagnostic with 1 ovary removed , then Hysterectomy(endometrial cancer) with other ovary removed   TONSILLECTOMY     Patient Active Problem List   Diagnosis Date Noted   Overactive bladder 10/29/2023   SUI (stress urinary incontinence, female) 10/29/2023   Chest pain 10/07/2021    PCP: Marvine Rush, MD   REFERRING PROVIDER: [name], MD   REFERRING DIAG: N32.81 (ICD-10-CM) - Overactive bladder N39.3 (ICD-10-CM) - SUI (stress urinary incontinence, female)  THERAPY DIAG:  Muscle weakness (generalized)  Unspecified lack of coordination  Abnormal posture  Rationale for Evaluation and Treatment: Rehabilitation  ONSET DATE: unknown   SUBJECTIVE:                                                                                                                                                                                           SUBJECTIVE STATEMENT: Chelsea Bell Patient reports to PT doing a little  better today. She is coming from her first orthopedic evaluation, which she plans to start the HEP for today. She reports that her concussion symptoms are improving from her fall. She has had a few instances of small leakage with transfers. She has been consistent with her HEP within reason giving her concussion symptoms. She wishes to have a few more PFPT visits to ensure her leakage is under control before continuing ortho PT independently.   Eval: Patient reports to pelvic PT with urinary incontinence that has been getting progressively worse for many years. Some  days she wears 2 pads, some days she has full bladder loss and requires more frequent changes. The pads she uses usually only catch 4-5 drops of urine when she does leak. No bowel related concerns to report. No pelvic pain to report.  Fluid intake: is trying to increase her water intake, she is trying to cut back on soda/caffeine   PAIN:  Are you having pain? No NPRS scale: 0/10  PRECAUTIONS: None  RED FLAGS: None   WEIGHT BEARING RESTRICTIONS: No  FALLS:  Has patient fallen in last 6 months? 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, is a Facilities manager   ACTIVITY LEVEL : not currently exercising consistently   PLOF: Independent  PATIENT GOALS: to decrease urinary leakage and control bladder better   PERTINENT HISTORY:  Has plantar fasciitis and she uses a cane to help her ambulate  Hx: GERD, endometrial cancer (surgery), abdominal hysterectomy, ovarian removal  Sexual abuse: No  BOWEL MOVEMENT: Pain with bowel movement: No Type of bowel movement:Type (Bristol Stool Scale) 4, Frequency 1x/day, Strain no, and Splinting no Fully empty rectum: Yes:   Leakage: No Pads: No Fiber supplement/laxative No  URINATION: Pain with urination: No Fully empty bladder: Noshe feels like she empties, but there is some residual voiding volume  Stream: Weak Urgency: Yes  Frequency: feels like she goes all the time -  goes 1-3x/night  Leakage: Urge to void, Walking to the bathroom, Coughing, Sneezing, and Laughing Pads: Yes: long pads 4 or 5 drops   INTERCOURSE: not currently sexually active   PREGNANCY: Vaginal deliveries 0 C-section deliveries 0  PROLAPSE: None  OBJECTIVE:  Note: Objective measures were completed at Evaluation unless otherwise noted.  PATIENT SURVEYS: PFIQ-7: 14  COGNITION: Overall cognitive status: Within functional limits for tasks assessed     SENSATION: Light touch: Appears intact  LUMBAR SPECIAL TESTS:  Single leg stance test: Positive  FUNCTIONAL TESTS:  Squat: mild dynamic knee valgus bilaterally with sit to stand transfer   GAIT: Assistive device utilized: Single point cane Comments: moderate trendelenburg gait pattern with ambulation   POSTURE: rounded shoulders, forward head, increased thoracic kyphosis, and flexed trunk    LUMBARAROM/PROM: within functional limits   A/PROM A/PROM  eval  Flexion   Extension   Right lateral flexion   Left lateral flexion   Right rotation   Left rotation    (Blank rows = not tested)  LOWER EXTREMITY ROM: within functional limits   Active ROM Right eval Left eval  Hip flexion    Hip extension    Hip abduction    Hip adduction    Hip internal rotation    Hip external rotation    Knee flexion    Knee extension    Ankle dorsiflexion    Ankle plantarflexion    Ankle inversion    Ankle eversion     (Blank rows = not tested)  LOWER EXTREMITY MMT: 4-/5 bilateral knees and hips grossly   MMT Right eval Left eval  Hip flexion    Hip extension    Hip abduction    Hip adduction    Hip internal rotation    Hip external rotation    Knee flexion    Knee extension    Ankle dorsiflexion    Ankle plantarflexion    Ankle inversion    Ankle eversion     (Blank rows = not tested) PALPATION:   General: no significant tenderness to palpation at bilateral adductors or hip  flexors   Pelvic Alignment:  within normal limits   Abdominal: decreased rib excursion with inhalation, abdominal bracing at rest, upper chest breathing at rest                External Perineal Exam: mild dryness noted with sufficient clitoral hood mobility present                               Internal Pelvic Floor: Patient fully consents to today's internal examination. She demonstrates a stiffness throughout the superficial and deep layers of the pelvic floor bilaterally, but no tenderness to palpation of superficial or deep musculature throughout. Patient demonstrates a lack of coordination in the pelvic floor with inhalation/exhalation. Following internal cueing, patient was able to actively lengthen and shorten the pelvic floor with her breathing techniques. Repetitive pelvic floor contractions were challenging for the patient.   Patient confirms identification and approves PT to assess internal pelvic floor and treatment Yes No emotional/communication barriers or cognitive limitation. Patient is motivated to learn. Patient understands and agrees with treatment goals and plan. PT explains patient will be examined in standing, sitting, and lying down to see how their muscles and joints work. When they are ready, they will be asked to remove their underwear so PT can examine their perineum. The patient is also given the option of providing their own chaperone as one is not provided in our facility. The patient also has the right and is explained the right to defer or refuse any part of the evaluation or treatment including the internal exam. With the patient's consent, PT will use one gloved finger to gently assess the muscles of the pelvic floor, seeing how well it contracts and relaxes and if there is muscle symmetry. After, the patient will get dressed and PT and patient will discuss exam findings and plan of care. PT and patient discuss plan of care, schedule, attendance policy and HEP activities.  PELVIC MMT:   MMT eval   Vaginal 3/5 manual muscle test score, 5 quick flicks that were challenging, 0 second hold  Internal Anal Sphincter   External Anal Sphincter   Puborectalis   Diastasis Recti   (Blank rows = not tested)        TONE: Within normal limits   PROLAPSE: No significant anterior or posterior vaginal wall laxity present   TODAY'S TREATMENT:                                                                                                                              DATE:   01/07/24: Neuro re-ed: standing diaphragmatic breathing + pelvic floor lengthening + shortening with inhalation/exhalation 2x10  Sit to stand with kegel at top + diaphragmatic breathing 2x8  Standing plie with pelvic floor contraction + diaphragmatic breathing 2x10  Sidelying clam (RTB) + reverse clamshell + diaphragmatic breathing 2x8  Therapeutic exercise:  Step up + diaphragmatic  breathing 2x6 each  Standing hip abduction with RTB around ankles + diaphragmatic breathing 2x10  Standing marching at counter with 2# ankle weights + diaphragmatic breathing 2x10  Self care: Relative pelvic floor anatomy and the connection between the diaphragm and pelvic floor, intraabdominal pressure management and how this affects our pelvic floor musculature Knack technique for stress urinary incontinence  Toileting mechanics and techniques to fully empty rectum when passing bowel movements   01/14/24: Neuro re-ed: standing diaphragmatic breathing + pelvic floor lengthening + shortening with inhalation/exhalation 2x10  seated diaphragmatic breathing + pelvic floor lengthening + shortening with inhalation/exhalation 2x10  Self care: Relative pelvic floor anatomy and the connection between the diaphragm and pelvic floor, intraabdominal pressure management and how this affects our pelvic floor musculature Knack technique for stress urinary incontinence  Toileting mechanics and techniques to fully empty rectum when passing bowel movements   In the meantime while you recover from your concussion: Hold off on all pelvic floor exercises other than these:  Standing/seated pelvic floor contractions: Range of motion training: 2x10  Sit or stand with your hands on your stomach. Inhale as your tummy fills with air like a balloon filling up, and your pelvic floor relaxes. Exhale as you perform a kegel while you blow out.  Quick flicks: 2x10  Sit or stand and perform 10 quick kegels in a row, making sure you rest between each contraction  Rest as much as needed, avoid high intensity movement or exercise, limit screen time if you can, stay extra hydrated during this time   01/20/24: Neuro re-ed: standing diaphragmatic breathing + pelvic floor lengthening + shortening with inhalation/exhalation 2x10  Sit to stand with kegel at top + diaphragmatic breathing 2x8  Standing alternating march + kegel at top of march + diaphragmatic breathing 2x10 each  Sidelying clam (RTB) + reverse clamshell + diaphragmatic breathing 2x8  Therapeutic exercise:  Standing hip abduction with RTB around ankles + diaphragmatic breathing 2x10  Standing alternating march + kegel at top of march + diaphragmatic breathing 2x10 each  Self care: Relative pelvic floor anatomy and the connection between the diaphragm and pelvic floor, intraabdominal pressure management and how this affects our pelvic floor musculature Knack technique for stress urinary incontinence  Toileting mechanics and techniques to fully empty rectum when passing bowel movements   PATIENT EDUCATION:  Education details: Relative pelvic floor anatomy and the connection between the diaphragm and pelvic floor, intraabdominal pressure management and how this affects our pelvic floor musculature Person educated: Patient Education method: Explanation, Demonstration, Tactile cues, Verbal cues, and Handouts Education comprehension: verbalized understanding, returned demonstration, verbal cues required,  tactile cues required, and needs further education  HOME EXERCISE PROGRAM: Access Code: [email or web address] URL: [email or web address] Date: 01/20/2024 Prepared by: Celena Domino  Exercises - Standing Pelvic Floor Contraction  - 1 x daily - 7 x weekly - 2 sets - 10 reps - Standing Marching  - 1 x daily - 7 x weekly - 2 sets - 10 reps - Sit to Stand Without Arm Support  - 1 x daily - 7 x weekly - 2 sets - 8-10 reps - Clam with Resistance  - 1 x daily - 7 x weekly - 2 sets - 10 reps - Sidelying Reverse Clamshell  - 1 x daily - 7 x weekly - 2 sets - 10 reps - Supine Bridge with Resistance Band  - 1 x daily - 7 x weekly - 2 sets - 10 reps - Seated Hip Abduction  - 1  x daily - 7 x weekly - 2 sets - 10 reps - Standing Hip Abduction with Resistance at Ankles and Counter Support  - 1 x daily - 7 x weekly - 2 sets - 10 reps  ASSESSMENT:  CLINICAL IMPRESSION: Patient is a 68 y.o. female  who was seen today for physical therapy treatment for stress incontinence. Since patient's fall, she has been resting more than completing her HEP, and she wishes to have a few more visits before discharging to ensure she has optimal urinary control. We reviewed seated pelvic floor training and hip strengthening exercises with addition of standing pelvic training. No pain following end of session. No pain or leakage following today's session and Pt would benefit from additional PT to further address deficits.    OBJECTIVE IMPAIRMENTS: decreased coordination, decreased endurance, decreased mobility, decreased ROM, and decreased strength.   ACTIVITY LIMITATIONS: continence  PARTICIPATION LIMITATIONS: N/A  PERSONAL FACTORS: Age, Past/current experiences, and Time since onset of injury/illness/exacerbation are also affecting patient's functional outcome.   REHAB POTENTIAL: Good  CLINICAL DECISION MAKING: Stable/uncomplicated  EVALUATION COMPLEXITY: Low   GOALS: Goals reviewed with patient? Yes  SHORT TERM  GOALS: Target date: 11/28/2023  Pt will be independent with HEP.  Baseline: Goal status: GOAL MET 01/20/24  2.  Pt will be independent with the knack, urge suppression technique, and double voiding in order to improve bladder habits and decrease urinary incontinence.   Baseline:  Goal status: GOAL MET 01/20/24  3.  Pt will report no leaks with laughing, coughing, sneezing in order to improve comfort with interpersonal relationships and community activities so that she does not have to change pads multiple times per day. Baseline:  Goal status: GOAL MET 01/20/24  LONG TERM GOALS: Target date: 05/01/2024  Pt will be independent with advanced HEP.  Baseline:  Goal status: ONGOING 01/20/24  2.  Pt to demonstrate improved coordination of pelvic floor and breathing mechanics with 10# squat with appropriate synergistic patterns to decrease pain and leakage at least 75% of the time.   Baseline:  Goal status: ONGOING 01/20/24  3.  Pt will be able to lift at least 10 lb correctly for 10 reps without pain or leakage for functional activities to allow patient to pick up weighted objects in public without leaking urine.  Baseline:  Goal status: ONGOING 01/20/24  4.  Pt will demonstrate increase in all impaired hip strength by 1 muscle grades in order to demonstrate improved lumbopelvic support and increase functional ability.   Baseline:  Goal status: ONGOING 01/20/24  PLAN:  PT FREQUENCY: 2 more visits before end of August   PT DURATION: 2 months   PLANNED INTERVENTIONS: 97110-Therapeutic exercises, 97530- Therapeutic activity, 97112- Neuromuscular re-education, 97535- Self Care, [id number]- Manual therapy, Taping, Dry Needling, Joint mobilization, Spinal mobilization, Scar mobilization, Cryotherapy, and Moist heat  PLAN FOR NEXT SESSION: continued pelvic floor AROM training in seated, introduce hip and core strengthening, introduce downtraining stretches   Celena JAYSON Domino, PT 01/20/2024, 12:30 PM

## 2024-01-30 NOTE — Therapy (Signed)
 OUTPATIENT PHYSICAL THERAPY LOWER EXTREMITY TREATMENT   Patient Name: Chelsea Bell MRN: [id number] DOB:[birth year], 68 y.o., female Today's Date: 02/02/2024  END OF SESSION:  PT End of Session - 02/02/24 1236     Visit Number 11    Authorization Type BCBS Medicare    PT Start Time 1236    PT Stop Time 1328    PT Time Calculation (min) 52 min    Activity Tolerance Patient tolerated treatment well    Behavior During Therapy WFL for tasks assessed/performed           Past Medical History:  Diagnosis Date   Cancer (HCC)    endometrial cancer- surgery only   GERD (gastroesophageal reflux disease)    Past Surgical History:  Procedure Laterality Date   ABDOMINAL HYSTERECTOMY     COLONOSCOPY WITH PROPOFOL  N/A 06/09/2014   Procedure: COLONOSCOPY WITH PROPOFOL ;  Surgeon: Lamar Donnald GAILS, MD;  Location: [facility];  Service: Endoscopy;  Laterality: N/A;   DILATION AND CURETTAGE OF UTERUS     LAPAROSCOPIC OVARIAN     diagnostic with 1 ovary removed , then Hysterectomy(endometrial cancer) with other ovary removed   TONSILLECTOMY     Patient Active Problem List   Diagnosis Date Noted   Overactive bladder 10/29/2023   SUI (stress urinary incontinence, female) 10/29/2023   Chest pain 10/07/2021    PCP: Marvine Rush, MD  REFERRING PROVIDER: Marvine Rush, MD  REFERRING DIAG: Rt 26.7 Gait training  THERAPY DIAG:  Chronic pain of both knees  Muscle weakness (generalized)  Other low back pain  Unspecified lack of coordination  Abnormal posture  Rationale for Evaluation and Treatment: Rehabilitation  ONSET DATE:  01/11/2024  SUBJECTIVE:   SUBJECTIVE STATEMENT: I wanted to go over some things that I might not have told Ciera. I want to be able to walk without limping and using a cane, going up/down stairs, get up/down off the floor. I also have plantar faciitis which affects my walking. Yesterday took shower for church and then I started getting a HA and  pressure. So stayed home. About 3-4 o'clock it was better. She states that in March she had an episode of her gait freezing trying to walk from her sisters house to the car. I use a scooter at the grocery store.   Eval: Patient presents with pain in her feet and ankles. June 22 she had a bad fall. She had stool in her bathroom and she tripped over it and she hit her head on the door frame. She has multiple bruises on her head, knee, and ankle. Patient denies a brain bleed and any broken bones. She had a follow up with the orthopedic doctor and she has multiple appointments coming up. She has a concussion and does not get frequent headaches but she gets occasional twinges and sharp pains. She still get very fatigued and she wants to rest majority of the day. Her left knee is her bad knee. She does not use her straight cane in her house, but she used out in the community [employer], appointments etc). She sometimes uses the scooter when she grocery shopes. Her goal is to not use the straight cane. She feels she is too young to be using a straight cane.   PERTINENT HISTORY: Currently in pelvic therapy; hx cancer, B plantar fasciitis (chronic) PAIN:  Are you having pain? Soreness in her shoulders. She has low back pain and occasional knee pain if she walks a lot.  PRECAUTIONS: Fall  and Other: Concussion   RED FLAGS: None   WEIGHT BEARING RESTRICTIONS: No  FALLS:  Has patient fallen in last 6 months? Yes. Number of falls 1 fall 01/11/2024 see details above  LIVING ENVIRONMENT: Lives with: lives alone Lives in: House/apartment Stairs: Yes: Internal: 3.5 steps; on left going up Has following equipment at home: Single point cane, Shower bench, and Grab bars  OCCUPATION: Not currently working  PLOF: Independent, Independent with basic ADLs, Independent with household mobility without device, Independent with community mobility with device, Independent with gait, Independent with transfers,  and Leisure: teaches painting classes   PATIENT GOALS: To get rid of straight cane; To be able to walk up the steps normally; to be more mobile    NEXT MD VISIT: This week with PCP  OBJECTIVE:  Note: Objective measures were completed at Evaluation unless otherwise noted.  DIAGNOSTIC FINDINGS: Patient had imaging done on 01/11/2024 after fall. Head CT, CT C-spine, Knee, and shoulder imaging was normal no concerns.  PATIENT SURVEYS:  LEFS: 38/80 47.5    COGNITION: Overall cognitive status: Within functional limits for tasks assessed     SENSATION: WFL    POSTURE: rounded shoulders and forward head    LOWER EXTREMITY ROM: WFL bilateral    LOWER EXTREMITY MMT: Grossly 4/5 bilateral     FUNCTIONAL TESTS:  5 times sit to stand: 13.80 sec ; hands on knees; some pain in right hip Timed up and go (TUG): 12.48 sec no straight cane; 11.40 sec with straight cane  02/02/24 748 ft and RPE 6-7/10  - pressure starts but less than 2/10  GAIT:  Assistive device utilized: Single point cane Comments: antalgic gait                                                                                                                                TREATMENT DATE:  02/02/24 748 ft and RPE 6-7/10  - pressure starts but less than 2/10 Seated LAQ x 20 B Seated heel raise x 20 Gastroc stretch L  2x30 sec (no stretch on R)  Sit to stand hands on knees x 10 - feels pressure on L side of head - less than 2/10 Self Care: Discussion of status, patient goals, schedule, concussion handout, symptoms mild and brief: Less than 2/10 and for less than an hour before progressing. Discussion of current HEP from pelvic floor and tolerance to that.   01/20/2024 Initial Evaluation & HEP created  Concussion protocol; returning to the gym; benefits of aquatics therapy  PATIENT EDUCATION:  Education details: PT eval findings, anticipated POC, progress with PT, and initial HEP Person educated:  Patient Education method: Explanation, Demonstration, and Handouts Education comprehension: verbalized understanding, returned demonstration, and needs further education  HOME EXERCISE PROGRAM: Access Code: [email or web address] URL: [email or web address] Date: 01/20/2024 Prepared by: Kristeen Sar  Exercises - Seated Long Arc Quad  - 1 x daily - 7 x weekly - 2  sets - 10 reps - 2 hold - Long Sitting Ankle Pumps  - 1 x daily - 7 x weekly - 2 sets - 10 reps - Seated Heel Raise  - 1 x daily - 7 x weekly - 2 sets - 10 reps - Seated Ankle Alphabet  - 1 x daily - 7 x weekly - 1 sets - 10 reps  Pt Education:  [email or web address]  ASSESSMENT:  CLINICAL IMPRESSION: Patient presents today reporting that yesterday she got up and showered for church, but then developed increased pressure and HA, so she stayed home. Her symptoms lasted until 3-4 o;clock. We discussed keeping her symptoms mild and brief before progressing meaning less than 2/10 pressure or HA and having the symptoms for less than an hour. She completed 6 MWT and other TE without exceeding 2/10 today. Patient provided with CDC handout regarding concussion resources. Patient reports she is stopping and resting as needed with ADLS. Her MD is still hesitant about PT. We discussed a trial to see her tolerance and keeping symptoms mild and brief.   Eval: Patient is a 68 y.o. female who was seen today for physical therapy evaluation and treatment for gait training. Nathanel presents with chronic bilateral foot and ankle that was re aggravated January 11, 2024 after a fall. She fell in her bathroom and she hit her head on the door threshold. She had imaging down and everything came back unremarkable. She does have a concussion and she is limited in her able to bend and pick up items and she gets occasional sharp pains in her head. She denies frequent headaches. She goal  is to initial a gym routine and stay mobile. Patient would benefit from some sessions of aquatic therapy to establish an aquatic program to perform on her own. Patient is motivated and wants to get better. Patient will benefit from skilled PT to address the below impairments and improve overall function.    OBJECTIVE IMPAIRMENTS: Abnormal gait, decreased balance, decreased endurance, difficulty walking, decreased ROM, decreased strength, increased muscle spasms, prosthetic dependency , and pain.   ACTIVITY LIMITATIONS: carrying, lifting, bending, standing, squatting, stairs, transfers, and locomotion level  PARTICIPATION LIMITATIONS: meal prep, cleaning, laundry, interpersonal relationship, shopping, community activity, and church  PERSONAL FACTORS: Age, Fitness, and Time since onset of injury/illness/exacerbation are also affecting patient's functional outcome.   REHAB POTENTIAL: Good  CLINICAL DECISION MAKING: Evolving/moderate complexity  EVALUATION COMPLEXITY: Moderate   GOALS: Goals reviewed with patient? Yes  SHORT TERM GOALS: Target date: 02/17/2024  Patient will be independent with initial HEP. Baseline:  Goal status: INITIAL  2.  Patient will be able to participant a to establish a baseline for functional test. Baseline:  Goal status: INITIAL   3.  Patient will perform 5STS in < or = to 12 sec for improved functional mobility. Baseline:  Goal status: INITIAL    LONG TERM GOALS: Target date: 03/16/2024  Patient will demonstrate independence in advanced HEP. Baseline:  Goal status: INITIAL  2.  Patient will report > or = to 50% improvement in ankle and foot pain since starting PT. Baseline:  Goal status: INITIAL  3.  Patient will verbalize and demonstrate self-care strategies to manage pain including tissue mobility practices and change of position. Baseline:  Goal status: INITIAL  4.  Patient will score > or = to 48/80 on LEFS due to improved  function. Baseline: 38/80 Goal status: INITIAL  5.  Patient will be able to stand long enough to wash dishes  with < or = to 2/10 back pain. Baseline: has to take rests Goal status: INITIAL  6.  Patient will demonstrate proper posture when bending and lifting to prevent back injury. Baseline:  Goal status: INITIAL   PLAN:  PT FREQUENCY: 2x/week  PT DURATION: 8 weeks  PLANNED INTERVENTIONS: 97164- PT Re-evaluation, 97110-Therapeutic exercises, 97530- Therapeutic activity, 97112- Neuromuscular re-education, 97535- Self Care, 02859- Manual therapy, [phone]- Gait training, [phone]- Canalith repositioning, [id number]- Aquatic Therapy, [phone]- Electrical stimulation (unattended), [phone]- Electrical stimulation (manual), [id number]- Vasopneumatic device, [id number]- Ultrasound, [id number]- Ionotophoresis 4mg /ml Dexamethasone , 79439 (1-2 muscles), 20561 (3+ muscles)- Dry Needling, Patient/Family education, Balance training, Stair training, Taping, Joint mobilization, Joint manipulation, Spinal manipulation, Spinal mobilization, Vestibular training, Cryotherapy, and Moist heat  PLAN FOR NEXT SESSION: Review HEP; 3/6 MWT; LE strengthening; ankle strengthening & ROM; core strengthening    Mliss Cummins, PT  02/02/24 1:39 PM [facility] [address], [address] [place], [place] [address] Phone # [phone] Fax [phone]

## 2024-02-02 ENCOUNTER — Ambulatory Visit: Admitting: Physical Therapy

## 2024-02-02 DIAGNOSIS — M5459 Other low back pain: Secondary | ICD-10-CM

## 2024-02-02 DIAGNOSIS — M6281 Muscle weakness (generalized): Secondary | ICD-10-CM

## 2024-02-02 DIAGNOSIS — R293 Abnormal posture: Secondary | ICD-10-CM

## 2024-02-02 DIAGNOSIS — M25561 Pain in right knee: Secondary | ICD-10-CM | POA: Diagnosis not present

## 2024-02-02 DIAGNOSIS — G8929 Other chronic pain: Secondary | ICD-10-CM

## 2024-02-02 DIAGNOSIS — R279 Unspecified lack of coordination: Secondary | ICD-10-CM

## 2024-02-04 ENCOUNTER — Ambulatory Visit: Admitting: Rehabilitative and Restorative Service Providers"

## 2024-02-04 ENCOUNTER — Encounter: Payer: Self-pay | Admitting: Physical Therapy

## 2024-02-04 ENCOUNTER — Ambulatory Visit: Admitting: Physical Therapy

## 2024-02-04 DIAGNOSIS — R279 Unspecified lack of coordination: Secondary | ICD-10-CM

## 2024-02-04 DIAGNOSIS — M5459 Other low back pain: Secondary | ICD-10-CM

## 2024-02-04 DIAGNOSIS — G8929 Other chronic pain: Secondary | ICD-10-CM

## 2024-02-04 DIAGNOSIS — M25561 Pain in right knee: Secondary | ICD-10-CM | POA: Diagnosis not present

## 2024-02-04 DIAGNOSIS — R293 Abnormal posture: Secondary | ICD-10-CM

## 2024-02-04 DIAGNOSIS — M6281 Muscle weakness (generalized): Secondary | ICD-10-CM

## 2024-02-04 NOTE — Therapy (Signed)
 OUTPATIENT PHYSICAL THERAPY LOWER EXTREMITY TREATMENT   Patient Name: Chelsea Bell MRN: [id number] DOB:29-Oct-1955, 68 y.o., female Today's Date: 02/04/2024  END OF SESSION:  PT End of Session - 02/04/24 0905     Visit Number 12    Authorization Type BCBS Medicare    Progress Note Due on Visit 20    PT Start Time 0906    PT Stop Time 1000    PT Time Calculation (min) 54 min    Activity Tolerance Patient tolerated treatment well    Behavior During Therapy WFL for tasks assessed/performed            Past Medical History:  Diagnosis Date   Cancer (HCC)    endometrial cancer- surgery only   GERD (gastroesophageal reflux disease)    Past Surgical History:  Procedure Laterality Date   ABDOMINAL HYSTERECTOMY     COLONOSCOPY WITH PROPOFOL  N/A 06/09/2014   Procedure: COLONOSCOPY WITH PROPOFOL ;  Surgeon: Lamar Donnald GAILS, MD;  Location: [facility];  Service: Endoscopy;  Laterality: N/A;   DILATION AND CURETTAGE OF UTERUS     LAPAROSCOPIC OVARIAN     diagnostic with 1 ovary removed , then Hysterectomy(endometrial cancer) with other ovary removed   TONSILLECTOMY     Patient Active Problem List   Diagnosis Date Noted   Overactive bladder 10/29/2023   SUI (stress urinary incontinence, female) 10/29/2023   Chest pain 10/07/2021    PCP: Marvine Rush, MD  REFERRING PROVIDER: Marvine Rush, MD  REFERRING DIAG: Rt 26.7 Gait training  THERAPY DIAG:  Chronic pain of both knees  Muscle weakness (generalized)  Other low back pain  Unspecified lack of coordination  Abnormal posture  Rationale for Evaluation and Treatment: Rehabilitation  ONSET DATE:  01/11/2024  SUBJECTIVE:   SUBJECTIVE STATEMENT: Feeling ok today, I used to do water aerobics back in the day. Pt is thinking of joining [employer] for pool access.   Eval: Patient presents with pain in her feet and ankles. June 22 she had a bad fall. She had stool in her bathroom and she tripped over it and she  hit her head on the door frame. She has multiple bruises on her head, knee, and ankle. Patient denies a brain bleed and any broken bones. She had a follow up with the orthopedic doctor and she has multiple appointments coming up. She has a concussion and does not get frequent headaches but she gets occasional twinges and sharp pains. She still get very fatigued and she wants to rest majority of the day. Her left knee is her bad knee. She does not use her straight cane in her house, but she used out in the community [employer], appointments etc). She sometimes uses the scooter when she grocery shopes. Her goal is to not use the straight cane. She feels she is too young to be using a straight cane.   PERTINENT HISTORY: Currently in pelvic therapy; hx cancer, B plantar fasciitis (chronic) PAIN:  Are you having pain? Soreness in her shoulders. She has low back pain and occasional knee pain if she walks a lot.  PRECAUTIONS: Fall and Other: Concussion   RED FLAGS: None   WEIGHT BEARING RESTRICTIONS: No  FALLS:  Has patient fallen in last 6 months? Yes. Number of falls 1 fall 01/11/2024 see details above  LIVING ENVIRONMENT: Lives with: lives alone Lives in: House/apartment Stairs: Yes: Internal: 3.5 steps; on left going up Has following equipment at home: Single point cane, Shower bench, and Grab bars  OCCUPATION: Not currently working  PLOF: Independent, Independent with basic ADLs, Independent with household mobility without device, Independent with community mobility with device, Independent with gait, Independent with transfers, and Leisure: teaches painting classes   PATIENT GOALS: To get rid of straight cane; To be able to walk up the steps normally; to be more mobile    NEXT MD VISIT: This week with PCP  OBJECTIVE:  Note: Objective measures were completed at Evaluation unless otherwise noted.  DIAGNOSTIC FINDINGS: Patient had imaging done on 01/11/2024 after fall. Head CT, CT  C-spine, Knee, and shoulder imaging was normal no concerns.  PATIENT SURVEYS:  LEFS: 38/80 47.5    COGNITION: Overall cognitive status: Within functional limits for tasks assessed     SENSATION: WFL    POSTURE: rounded shoulders and forward head    LOWER EXTREMITY ROM: WFL bilateral    LOWER EXTREMITY MMT: Grossly 4/5 bilateral     FUNCTIONAL TESTS:  5 times sit to stand: 13.80 sec ; hands on knees; some pain in right hip Timed up and go (TUG): 12.48 sec no straight cane; 11.40 sec with straight cane  02/02/24 748 ft and RPE 6-7/10  - pressure starts but less than 2/10  GAIT:  Assistive device utilized: Single point cane Comments: antalgic gait                                                                                                                                TREATMENT DATE:   02/04/24:Pt arrives for aquatic physical therapy. Treatment took place in 3.5-5.5 feet of water. Water temperature was. Pt entered the pool via stairs slowly and with heavy use of the hand rail. Pt ambulates to pool deck independently.Pt requires buoyancy of water for support and to offload joints with strengthening exercises.  Seated water bench with 75% submersion Pt performed seated LE AROM exercises 20x in all planes, with concurrent verbal education of water principles and how we would use them as well as parameters to follow if she gets a HA or feels symptomatic in any other way. Pt verbally understood and agreed.  75% depth water walking with natural arms 4x in each direction. Gentle hip 3 ways kicks 10x Bil. High knee marching across pool 4x holding yellow noodle for balance. Seated decompression 2 min with 2 noodle support and PTA helping to stabilize her trunk.  02/02/24 748 ft and RPE 6-7/10  - pressure starts but less than 2/10 Seated LAQ x 20 B Seated heel raise x 20 Gastroc stretch L  2x30 sec (no stretch on R)  Sit to stand hands on knees x 10 - feels pressure on  L side of head - less than 2/10 Self Care: Discussion of status, patient goals, schedule, concussion handout, symptoms mild and brief: Less than 2/10 and for less than an hour before progressing. Discussion of current HEP from pelvic floor and tolerance to that.   01/20/2024 Initial  Evaluation & HEP created  Concussion protocol; returning to the gym; benefits of aquatics therapy  PATIENT EDUCATION:  Education details: PT eval findings, anticipated POC, progress with PT, and initial HEP Person educated: Patient Education method: Explanation, Demonstration, and Handouts Education comprehension: verbalized understanding, returned demonstration, and needs further education  HOME EXERCISE PROGRAM: Access Code: [email or web address] URL: [email or web address] Date: 01/20/2024 Prepared by: Kristeen Sar  Exercises - Seated Long Arc Quad  - 1 x daily - 7 x weekly - 2 sets - 10 reps - 2 hold - Long Sitting Ankle Pumps  - 1 x daily - 7 x weekly - 2 sets - 10 reps - Seated Heel Raise  - 1 x daily - 7 x weekly - 2 sets - 10 reps - Seated Ankle Alphabet  - 1 x daily - 7 x weekly - 1 sets - 10 reps  Pt Education:  [email or web address]  ASSESSMENT:  CLINICAL IMPRESSION: Pt arrives for her first aquatic treatment. Pt was able to ambulate to pool deck independently carrying her bags. A lot of time spent assessing response to every exercise to make sure we establish a positive baseline for pt, one that does does not bring on any symptoms. Nathanel did well with everything, no fatigue noted at end of session.   Eval: Patient is a 68 y.o. female who was seen today for physical therapy evaluation and treatment for gait training. Nathanel presents with chronic bilateral foot and ankle that was re aggravated January 11, 2024 after a fall. She fell in her bathroom and she hit her head on the door threshold. She had imaging down and  everything came back unremarkable. She does have a concussion and she is limited in her able to bend and pick up items and she gets occasional sharp pains in her head. She denies frequent headaches. She goal is to initial a gym routine and stay mobile. Patient would benefit from some sessions of aquatic therapy to establish an aquatic program to perform on her own. Patient is motivated and wants to get better. Patient will benefit from skilled PT to address the below impairments and improve overall function.    OBJECTIVE IMPAIRMENTS: Abnormal gait, decreased balance, decreased endurance, difficulty walking, decreased ROM, decreased strength, increased muscle spasms, prosthetic dependency , and pain.   ACTIVITY LIMITATIONS: carrying, lifting, bending, standing, squatting, stairs, transfers, and locomotion level  PARTICIPATION LIMITATIONS: meal prep, cleaning, laundry, interpersonal relationship, shopping, community activity, and church  PERSONAL FACTORS: Age, Fitness, and Time since onset of injury/illness/exacerbation are also affecting patient's functional outcome.   REHAB POTENTIAL: Good  CLINICAL DECISION MAKING: Evolving/moderate complexity  EVALUATION COMPLEXITY: Moderate   GOALS: Goals reviewed with patient? Yes  SHORT TERM GOALS: Target date: 02/17/2024  Patient will be independent with initial HEP. Baseline:  Goal status: INITIAL  2.  Patient will be able to participant a to establish a baseline for functional test. Baseline:  Goal status: INITIAL   3.  Patient will perform 5STS in < or = to 12 sec for improved functional mobility. Baseline:  Goal status: INITIAL    LONG TERM GOALS: Target date: 03/16/2024  Patient will demonstrate independence in advanced HEP. Baseline:  Goal status: INITIAL  2.  Patient will report > or = to 50% improvement in ankle and foot pain since starting PT. Baseline:  Goal status: INITIAL  3.  Patient will verbalize and  demonstrate self-care strategies to manage pain including tissue mobility practices and change of position. Baseline:  Goal status:  INITIAL  4.  Patient will score > or = to 48/80 on LEFS due to improved function. Baseline: 38/80 Goal status: INITIAL  5.  Patient will be able to stand long enough to wash dishes with < or = to 2/10 back pain. Baseline: has to take rests Goal status: INITIAL  6.  Patient will demonstrate proper posture when bending and lifting to prevent back injury. Baseline:  Goal status: INITIAL   PLAN:  PT FREQUENCY: 2x/week  PT DURATION: 8 weeks  PLANNED INTERVENTIONS: 97164- PT Re-evaluation, 97110-Therapeutic exercises, 97530- Therapeutic activity, 97112- Neuromuscular re-education, 97535- Self Care, 02859- Manual therapy, [phone]- Gait training, [phone]- Canalith repositioning, [id number]- Aquatic Therapy, [phone]- Electrical stimulation (unattended), [phone]- Electrical stimulation (manual), [id number]- Vasopneumatic device, [id number]- Ultrasound, [id number]- Ionotophoresis 4mg /ml Dexamethasone , 79439 (1-2 muscles), 20561 (3+ muscles)- Dry Needling, Patient/Family education, Balance training, Stair training, Taping, Joint mobilization, Joint manipulation, Spinal manipulation, Spinal mobilization, Vestibular training, Cryotherapy, and Moist heat  PLAN FOR NEXT SESSION: Review HEP; 3/6 MWT; LE strengthening; ankle strengthening & ROM; core strengthening : assess response to aquatic treatment.  Delon Darner, PTA 02/04/24 10:15 AM     [facility] [address], [address] [place], [place] [address] Phone # [phone] Fax [phone]

## 2024-02-09 ENCOUNTER — Ambulatory Visit

## 2024-02-09 DIAGNOSIS — M25561 Pain in right knee: Secondary | ICD-10-CM | POA: Diagnosis not present

## 2024-02-09 DIAGNOSIS — M5459 Other low back pain: Secondary | ICD-10-CM

## 2024-02-09 DIAGNOSIS — M6281 Muscle weakness (generalized): Secondary | ICD-10-CM

## 2024-02-09 DIAGNOSIS — G8929 Other chronic pain: Secondary | ICD-10-CM

## 2024-02-09 NOTE — Therapy (Signed)
 OUTPATIENT PHYSICAL THERAPY LOWER EXTREMITY TREATMENT   Patient Name: Chelsea Bell MRN: [id number] DOB:[birth year], 68 y.o., female Today's Date: 02/09/2024  END OF SESSION:  PT End of Session - 02/09/24 1102     Visit Number 13   4 ortho, 9 pelvic   Authorization Type BCBS Medicare    Progress Note Due on Visit 20    PT Start Time 1018    PT Stop Time 1101    PT Time Calculation (min) 43 min    Activity Tolerance Patient tolerated treatment well    Behavior During Therapy WFL for tasks assessed/performed             Past Medical History:  Diagnosis Date   Cancer (HCC)    endometrial cancer- surgery only   GERD (gastroesophageal reflux disease)    Past Surgical History:  Procedure Laterality Date   ABDOMINAL HYSTERECTOMY     COLONOSCOPY WITH PROPOFOL  N/A 06/09/2014   Procedure: COLONOSCOPY WITH PROPOFOL ;  Surgeon: Lamar Donnald GAILS, MD;  Location: [facility];  Service: Endoscopy;  Laterality: N/A;   DILATION AND CURETTAGE OF UTERUS     LAPAROSCOPIC OVARIAN     diagnostic with 1 ovary removed , then Hysterectomy(endometrial cancer) with other ovary removed   TONSILLECTOMY     Patient Active Problem List   Diagnosis Date Noted   Overactive bladder 10/29/2023   SUI (stress urinary incontinence, female) 10/29/2023   Chest pain 10/07/2021    PCP: Marvine Rush, MD  REFERRING PROVIDER: Marvine Rush, MD  REFERRING DIAG: Rt 26.7 Gait training  THERAPY DIAG:  Chronic pain of both knees  Muscle weakness (generalized)  Other low back pain  Rationale for Evaluation and Treatment: Rehabilitation  ONSET DATE:  01/11/2024  SUBJECTIVE:   SUBJECTIVE STATEMENT: I loved the pool.  I went to church yesterday and then lunch. I had a headache the rest of the day.    Eval: Patient presents with pain in her feet and ankles. June 22 she had a bad fall. She had stool in her bathroom and she tripped over it and she hit her head on the door frame. She has multiple  bruises on her head, knee, and ankle. Patient denies a brain bleed and any broken bones. She had a follow up with the orthopedic doctor and she has multiple appointments coming up. She has a concussion and does not get frequent headaches but she gets occasional twinges and sharp pains. She still get very fatigued and she wants to rest majority of the day. Her left knee is her bad knee. She does not use her straight cane in her house, but she used out in the community [employer], appointments etc). She sometimes uses the scooter when she grocery shopes. Her goal is to not use the straight cane. She feels she is too young to be using a straight cane.   PERTINENT HISTORY: Currently in pelvic therapy; hx cancer, B plantar fasciitis (chronic) PAIN:  Are you having pain? Pain in feet. 4-5/10   PRECAUTIONS: Fall and Other: Concussion   RED FLAGS: None   WEIGHT BEARING RESTRICTIONS: No  FALLS:  Has patient fallen in last 6 months? Yes. Number of falls 1 fall 01/11/2024 see details above  LIVING ENVIRONMENT: Lives with: lives alone Lives in: House/apartment Stairs: Yes: Internal: 3.5 steps; on left going up Has following equipment at home: Single point cane, Shower bench, and Grab bars  OCCUPATION: Not currently working  PLOF: Independent, Independent with basic ADLs, Independent with  household mobility without device, Independent with community mobility with device, Independent with gait, Independent with transfers, and Leisure: teaches painting classes   PATIENT GOALS: To get rid of straight cane; To be able to walk up the steps normally; to be more mobile    NEXT MD VISIT: This week with PCP  OBJECTIVE:  Note: Objective measures were completed at Evaluation unless otherwise noted.  DIAGNOSTIC FINDINGS: Patient had imaging done on 01/11/2024 after fall. Head CT, CT C-spine, Knee, and shoulder imaging was normal no concerns.  PATIENT SURVEYS:  LEFS: 38/80 47.5    COGNITION: Overall cognitive status: Within functional limits for tasks assessed     SENSATION: WFL   POSTURE: rounded shoulders and forward head    LOWER EXTREMITY ROM: WFL bilateral    LOWER EXTREMITY MMT: Grossly 4/5 bilateral     FUNCTIONAL TESTS:  5 times sit to stand: 13.80 sec ; hands on knees; some pain in right hip Timed up and go (TUG): 12.48 sec no straight cane; 11.40 sec with straight cane  02/02/24 748 ft and RPE 6-7/10  - pressure starts but less than 2/10  GAIT:  Assistive device utilized: Single point cane Comments: antalgic gait                                                                                                                                TREATMENT DATE:  02/09/24 NuStep: Level 2x 6 minutes-PT present to discuss progress  Seated LAQ x 20 bil 2# added  Seated heel/toe raise x 20 Seated march: 2# added 2x10 Alternating step tap on 6 step x20 Step up on Rt and Lt with 1 hand rail, and 1 hand on cane x5 each Weight shifting on balance pad  x1.5 min each  Sit to stand hands on knees x 10    02/04/24:Pt arrives for aquatic physical therapy. Treatment took place in 3.5-5.5 feet of water. Water temperature was. Pt entered the pool via stairs slowly and with heavy use of the hand rail. Pt ambulates to pool deck independently.Pt requires buoyancy of water for support and to offload joints with strengthening exercises.  Seated water bench with 75% submersion Pt performed seated LE AROM exercises 20x in all planes, with concurrent verbal education of water principles and how we would use them as well as parameters to follow if she gets a HA or feels symptomatic in any other way. Pt verbally understood and agreed.  75% depth water walking with natural arms 4x in each direction. Gentle hip 3 ways kicks 10x Bil. High knee marching across pool 4x holding yellow noodle for balance. Seated decompression 2 min with 2 noodle support and PTA helping to  stabilize her trunk.  02/02/24 748 ft and RPE 6-7/10  - pressure starts but less than 2/10 Seated LAQ x 20 B Seated heel raise x 20 Gastroc stretch L  2x30 sec (no stretch on R)  Sit to  stand hands on knees x 10 - feels pressure on L side of head - less than 2/10 Self Care: Discussion of status, patient goals, schedule, concussion handout, symptoms mild and brief: Less than 2/10 and for less than an hour before progressing. Discussion of current HEP from pelvic floor and tolerance to that.    PATIENT EDUCATION:  Education details: PT eval findings, anticipated POC, progress with PT, and initial HEP Person educated: Patient Education method: Explanation, Demonstration, and Handouts Education comprehension: verbalized understanding, returned demonstration, and needs further education  HOME EXERCISE PROGRAM: Access Code: [email or web address] URL: [email or web address] Date: 02/09/2024 Prepared by: Burnard  Exercises - Seated Long Arc Quad  - 2 x daily - 7 x weekly - 2 sets - 10 reps - 2 hold - Long Sitting Ankle Pumps  - 1 x daily - 7 x weekly - 2 sets - 10 reps - Seated Heel Raise  - 2 x daily - 7 x weekly - 2 sets - 10 reps - Seated Ankle Alphabet  - 2 x daily - 7 x weekly - 1 sets - 1 reps - Seated March  - 2 x daily - 7 x weekly - 2 sets - 10 reps - Seated Ankle Dorsiflexion AROM  - 2 x daily - 7 x weekly - 2 sets - 10 reps - Forward Step Up with Counter Support  - 1 x daily - 7 x weekly - 1 sets - 5 reps  Pt Education:  [email or web address]  ASSESSMENT:  CLINICAL IMPRESSION: Pt overall is feeling better.  She was more active yesterday with church and lunch and had a headache.  PT added to HEP today and suggested she try to do exercises 2x/day as able.  We discussed concussion symptoms and how to modify activity when they are present.  Added in step-up from pelvic exercises that she had  stopped doing after her fall.  PT monitored throughout session for safety and symptoms.  Patient will benefit from skilled PT to address the below impairments and improve overall function.    Eval: Patient is a 68 y.o. female who was seen today for physical therapy evaluation and treatment for gait training. Nathanel presents with chronic bilateral foot and ankle that was re aggravated January 11, 2024 after a fall. She fell in her bathroom and she hit her head on the door threshold. She had imaging down and everything came back unremarkable. She does have a concussion and she is limited in her able to bend and pick up items and she gets occasional sharp pains in her head. She denies frequent headaches. She goal is to initial a gym routine and stay mobile. Patient would benefit from some sessions of aquatic therapy to establish an aquatic program to perform on her own. Patient is motivated and wants to get better. Patient will benefit from skilled PT to address the below impairments and improve overall function.    OBJECTIVE IMPAIRMENTS: Abnormal gait, decreased balance, decreased endurance, difficulty walking, decreased ROM, decreased strength, increased muscle spasms, prosthetic dependency , and pain.   ACTIVITY LIMITATIONS: carrying, lifting, bending, standing, squatting, stairs, transfers, and locomotion level  PARTICIPATION LIMITATIONS: meal prep, cleaning, laundry, interpersonal relationship, shopping, community activity, and church  PERSONAL FACTORS: Age, Fitness, and Time since onset of injury/illness/exacerbation are also affecting patient's functional outcome.   REHAB POTENTIAL: Good  CLINICAL DECISION MAKING: Evolving/moderate complexity  EVALUATION COMPLEXITY: Moderate   GOALS: Goals reviewed with patient? Yes  SHORT TERM GOALS: Target date: 02/17/2024  Patient will  be independent with initial HEP. Baseline:  Goal status: INITIAL  2.  Patient will be able to participant a to  establish a baseline for functional test. Baseline:  Goal status: INITIAL   3.  Patient will perform 5STS in < or = to 12 sec for improved functional mobility. Baseline:  Goal status: INITIAL    LONG TERM GOALS: Target date: 03/16/2024  Patient will demonstrate independence in advanced HEP. Baseline:  Goal status: INITIAL  2.  Patient will report > or = to 50% improvement in ankle and foot pain since starting PT. Baseline:  Goal status: INITIAL  3.  Patient will verbalize and demonstrate self-care strategies to manage pain including tissue mobility practices and change of position. Baseline:  Goal status: INITIAL  4.  Patient will score > or = to 48/80 on LEFS due to improved function. Baseline: 38/80 Goal status: INITIAL  5.  Patient will be able to stand long enough to wash dishes with < or = to 2/10 back pain. Baseline: has to take rests Goal status: INITIAL  6.  Patient will demonstrate proper posture when bending and lifting to prevent back injury. Baseline:  Goal status: INITIAL   PLAN:  PT FREQUENCY: 2x/week  PT DURATION: 8 weeks  PLANNED INTERVENTIONS: 97164- PT Re-evaluation, 97110-Therapeutic exercises, 97530- Therapeutic activity, 97112- Neuromuscular re-education, 97535- Self Care, 02859- Manual therapy, [phone]- Gait training, [phone]- Canalith repositioning, [id number]- Aquatic Therapy, [phone]- Electrical stimulation (unattended), [phone]- Electrical stimulation (manual), [id number]- Vasopneumatic device, [id number]- Ultrasound, [id number]- Ionotophoresis 4mg /ml Dexamethasone , 79439 (1-2 muscles), [id number] (3+ muscles)- Dry Needling, Patient/Family education, Balance training, Stair training, Taping, Joint mobilization, Joint manipulation, Spinal manipulation, Spinal mobilization, Vestibular training, Cryotherapy, and Moist heat  PLAN FOR NEXT SESSION: LE strengthening; ankle strengthening & ROM; core strengthening :continue land and aquatics   Burnard Joy, PT 02/09/24 11:03 AM      [facility] [address], [address] [place], [place] [address] Phone # [phone] Fax [phone]

## 2024-02-11 ENCOUNTER — Ambulatory Visit: Admitting: Physical Therapy

## 2024-02-13 ENCOUNTER — Ambulatory Visit: Admitting: Physical Therapy

## 2024-02-13 ENCOUNTER — Encounter: Payer: Self-pay | Admitting: Physical Therapy

## 2024-02-13 DIAGNOSIS — G8929 Other chronic pain: Secondary | ICD-10-CM

## 2024-02-13 DIAGNOSIS — M5459 Other low back pain: Secondary | ICD-10-CM

## 2024-02-13 DIAGNOSIS — M25561 Pain in right knee: Secondary | ICD-10-CM | POA: Diagnosis not present

## 2024-02-13 DIAGNOSIS — M6281 Muscle weakness (generalized): Secondary | ICD-10-CM

## 2024-02-13 DIAGNOSIS — R293 Abnormal posture: Secondary | ICD-10-CM

## 2024-02-13 DIAGNOSIS — R279 Unspecified lack of coordination: Secondary | ICD-10-CM

## 2024-02-13 NOTE — Therapy (Signed)
 OUTPATIENT PHYSICAL THERAPY LOWER EXTREMITY TREATMENT   Patient Name: Chelsea Bell MRN: [id number] DOB:1956-05-07, 68 y.o., female Today's Date: 02/13/2024  END OF SESSION:  PT End of Session - 02/13/24 1503     Visit Number 14   5th ortho   Number of Visits 10    Authorization Type BCBS Medicare    Progress Note Due on Visit 20    PT Start Time 1502    PT Stop Time 1545    PT Time Calculation (min) 43 min    Activity Tolerance Patient tolerated treatment well    Behavior During Therapy WFL for tasks assessed/performed              Past Medical History:  Diagnosis Date   Cancer (HCC)    endometrial cancer- surgery only   GERD (gastroesophageal reflux disease)    Past Surgical History:  Procedure Laterality Date   ABDOMINAL HYSTERECTOMY     COLONOSCOPY WITH PROPOFOL  N/A 06/09/2014   Procedure: COLONOSCOPY WITH PROPOFOL ;  Surgeon: Lamar Donnald GAILS, MD;  Location: [facility];  Service: Endoscopy;  Laterality: N/A;   DILATION AND CURETTAGE OF UTERUS     LAPAROSCOPIC OVARIAN     diagnostic with 1 ovary removed , then Hysterectomy(endometrial cancer) with other ovary removed   TONSILLECTOMY     Patient Active Problem List   Diagnosis Date Noted   Overactive bladder 10/29/2023   SUI (stress urinary incontinence, female) 10/29/2023   Chest pain 10/07/2021    PCP: Marvine Rush, MD  REFERRING PROVIDER: Marvine Rush, MD  REFERRING DIAG: Rt 26.7 Gait training  THERAPY DIAG:  Chronic pain of both knees  Muscle weakness (generalized)  Other low back pain  Unspecified lack of coordination  Abnormal posture  Rationale for Evaluation and Treatment: Rehabilitation  ONSET DATE:  01/11/2024  SUBJECTIVE:   SUBJECTIVE STATEMENT: My eyes are doing better. Got good report from my eye doctor. Still have some symptoms now and then. Doing very well in the water, my legs were more tired than I thought they would be.Today I have had a little something on the  back of my head but it is fine.  Eval: Patient presents with pain in her feet and ankles. June 22 she had a bad fall. She had stool in her bathroom and she tripped over it and she hit her head on the door frame. She has multiple bruises on her head, knee, and ankle. Patient denies a brain bleed and any broken bones. She had a follow up with the orthopedic doctor and she has multiple appointments coming up. She has a concussion and does not get frequent headaches but she gets occasional twinges and sharp pains. She still get very fatigued and she wants to rest majority of the day. Her left knee is her bad knee. She does not use her straight cane in her house, but she used out in the community [employer], appointments etc). She sometimes uses the scooter when she grocery shopes. Her goal is to not use the straight cane. She feels she is too young to be using a straight cane.   PERTINENT HISTORY: Currently in pelvic therapy; hx cancer, B plantar fasciitis (chronic) PAIN:  Are you having pain? Little something back of my head  PRECAUTIONS: Fall and Other: Concussion   RED FLAGS: None   WEIGHT BEARING RESTRICTIONS: No  FALLS:  Has patient fallen in last 6 months? Yes. Number of falls 1 fall 01/11/2024 see details above  LIVING ENVIRONMENT:  Lives with: lives alone Lives in: House/apartment Stairs: Yes: Internal: 3.5 steps; on left going up Has following equipment at home: Single point cane, Shower bench, and Grab bars  OCCUPATION: Not currently working  PLOF: Independent, Independent with basic ADLs, Independent with household mobility without device, Independent with community mobility with device, Independent with gait, Independent with transfers, and Leisure: teaches painting classes   PATIENT GOALS: To get rid of straight cane; To be able to walk up the steps normally; to be more mobile    NEXT MD VISIT: This week with PCP  OBJECTIVE:  Note: Objective measures were completed  at Evaluation unless otherwise noted.  DIAGNOSTIC FINDINGS: Patient had imaging done on 01/11/2024 after fall. Head CT, CT C-spine, Knee, and shoulder imaging was normal no concerns.  PATIENT SURVEYS:  LEFS: 38/80 47.5   COGNITION: Overall cognitive status: Within functional limits for tasks assessed     SENSATION: WFL   POSTURE: rounded shoulders and forward head    LOWER EXTREMITY ROM: WFL bilateral    LOWER EXTREMITY MMT: Grossly 4/5 bilateral     FUNCTIONAL TESTS:  5 times sit to stand: 13.80 sec ; hands on knees; some pain in right hip Timed up and go (TUG): 12.48 sec no straight cane; 11.40 sec with straight cane  02/02/24 748 ft and RPE 6-7/10  - pressure starts but less than 2/10  GAIT:  Assistive device utilized: Single point cane Comments: antalgic gait                                                                                                                                TREATMENT DATE:   02/13/24:Pt arrives for aquatic physical therapy. Treatment took place in 3.5-5.5 feet of water. Water temperature was. Pt entered the pool via stairs slowly and with heavy use of the hand rail. Pt ambulates to pool deck independently.Pt requires buoyancy of water for support and to offload joints with strengthening exercises.  Seated water bench with 75% submersion Pt performed seated LE AROM exercises 20x in all planes, with concurrent discussion on status. 75% depth water walking with natural arms 6x in each direction. Gentle hip 3 ways kicks 15x Bil. High knee marching across pool 4x holding yellow noodle for balance. Seated decompression 2 min with 2 noodle support and PTA helping to stabilize her trunk.  02/09/24 NuStep: Level 2x 6 minutes-PT present to discuss progress  Seated LAQ x 20 bil 2# added  Seated heel/toe raise x 20 Seated march: 2# added 2x10 Alternating step tap on 6 step x20 Step up on Rt and Lt with 1 hand rail, and 1 hand on cane x5  each Weight shifting on balance pad  x1.5 min each  Sit to stand hands on knees x 10    02/04/24:Pt arrives for aquatic physical therapy. Treatment took place in 3.5-5.5 feet of water. Water temperature was. Pt entered the pool via stairs slowly and with  heavy use of the hand rail. Pt ambulates to pool deck independently.Pt requires buoyancy of water for support and to offload joints with strengthening exercises.  Seated water bench with 75% submersion Pt performed seated LE AROM exercises 20x in all planes, with concurrent verbal education of water principles and how we would use them as well as parameters to follow if she gets a HA or feels symptomatic in any other way. Pt verbally understood and agreed.  75% depth water walking with natural arms 4x in each direction. Gentle hip 3 ways kicks 10x Bil. High knee marching across pool 4x holding yellow noodle for balance. Seated decompression 2 min with 2 noodle support and PTA helping to stabilize her trunk.    PATIENT EDUCATION:  Education details: PT eval findings, anticipated POC, progress with PT, and initial HEP Person educated: Patient Education method: Explanation, Demonstration, and Handouts Education comprehension: verbalized understanding, returned demonstration, and needs further education  HOME EXERCISE PROGRAM: Access Code: [email or web address] URL: [email or web address] Date: 02/09/2024 Prepared by: Burnard  Exercises - Seated Long Arc Quad  - 2 x daily - 7 x weekly - 2 sets - 10 reps - 2 hold - Long Sitting Ankle Pumps  - 1 x daily - 7 x weekly - 2 sets - 10 reps - Seated Heel Raise  - 2 x daily - 7 x weekly - 2 sets - 10 reps - Seated Ankle Alphabet  - 2 x daily - 7 x weekly - 1 sets - 1 reps - Seated March  - 2 x daily - 7 x weekly - 2 sets - 10 reps - Seated Ankle Dorsiflexion AROM  - 2 x daily - 7 x weekly - 2 sets - 10 reps - Forward Step Up with Counter Support  - 1 x daily - 7 x weekly - 1 sets - 5 reps  Pt  Education:  [email or web address]  ASSESSMENT:  CLINICAL IMPRESSION: Pt able to perform gentle aquatic exercises without concussion symptoms during or post treatment. She is going to attempt an independent session this weekend and see how she does.   Eval: Patient is a 68 y.o. female who was seen today for physical therapy evaluation and treatment for gait training. Nathanel presents with chronic bilateral foot and ankle that was re aggravated January 11, 2024 after a fall. She fell in her bathroom and she hit her head on the door threshold. She had imaging down and everything came back unremarkable. She does have a concussion and she is limited in her able to bend and pick up items and she gets occasional sharp pains in her head. She denies frequent headaches. She goal is to initial a gym routine and stay mobile. Patient would benefit from some sessions of aquatic therapy to establish an aquatic program to perform on her own. Patient is motivated and wants to get better. Patient will benefit from skilled PT to address the below impairments and improve overall function.    OBJECTIVE IMPAIRMENTS: Abnormal gait, decreased balance, decreased endurance, difficulty walking, decreased ROM, decreased strength, increased muscle spasms, prosthetic dependency , and pain.   ACTIVITY LIMITATIONS: carrying, lifting, bending, standing, squatting, stairs, transfers, and locomotion level  PARTICIPATION LIMITATIONS: meal prep, cleaning, laundry, interpersonal relationship, shopping, community activity, and church  PERSONAL FACTORS: Age, Fitness, and Time since onset of injury/illness/exacerbation are also affecting patient's functional outcome.   REHAB POTENTIAL: Good  CLINICAL DECISION MAKING: Evolving/moderate complexity  EVALUATION COMPLEXITY: Moderate   GOALS: Goals reviewed with patient? Yes  SHORT  TERM GOALS: Target date:  02/17/2024  Patient will be independent with initial HEP. Baseline:  Goal status: INITIAL  2.  Patient will be able to participant a to establish a baseline for functional test. Baseline:  Goal status: INITIAL   3.  Patient will perform 5STS in < or = to 12 sec for improved functional mobility. Baseline:  Goal status: INITIAL    LONG TERM GOALS: Target date: 03/16/2024  Patient will demonstrate independence in advanced HEP. Baseline:  Goal status: INITIAL  2.  Patient will report > or = to 50% improvement in ankle and foot pain since starting PT. Baseline:  Goal status: INITIAL  3.  Patient will verbalize and demonstrate self-care strategies to manage pain including tissue mobility practices and change of position. Baseline:  Goal status: INITIAL  4.  Patient will score > or = to 48/80 on LEFS due to improved function. Baseline: 38/80 Goal status: INITIAL  5.  Patient will be able to stand long enough to wash dishes with < or = to 2/10 back pain. Baseline: has to take rests Goal status: INITIAL  6.  Patient will demonstrate proper posture when bending and lifting to prevent back injury. Baseline:  Goal status: INITIAL   PLAN:  PT FREQUENCY: 2x/week  PT DURATION: 8 weeks  PLANNED INTERVENTIONS: 97164- PT Re-evaluation, 97110-Therapeutic exercises, 97530- Therapeutic activity, 97112- Neuromuscular re-education, 97535- Self Care, 02859- Manual therapy, [phone]- Gait training, [phone]- Canalith repositioning, [id number]- Aquatic Therapy, [phone]- Electrical stimulation (unattended), [phone]- Electrical stimulation (manual), [id number]- Vasopneumatic device, [id number]- Ultrasound, [id number]- Ionotophoresis 4mg /ml Dexamethasone , 79439 (1-2 muscles), 20561 (3+ muscles)- Dry Needling, Patient/Family education, Balance training, Stair training, Taping, Joint mobilization, Joint manipulation, Spinal manipulation, Spinal mobilization, Vestibular training, Cryotherapy, and Moist heat  PLAN FOR NEXT  SESSION: LE strengthening; ankle strengthening & ROM; core strengthening :continue land and aquatics   Delon Darner, PTA 02/13/24 3:26 PM      [facility] [address], [address] [place], [place] [address] Phone # [phone] Fax [phone]

## 2024-02-16 ENCOUNTER — Ambulatory Visit

## 2024-02-16 DIAGNOSIS — M6281 Muscle weakness (generalized): Secondary | ICD-10-CM

## 2024-02-16 DIAGNOSIS — G8929 Other chronic pain: Secondary | ICD-10-CM

## 2024-02-16 DIAGNOSIS — R293 Abnormal posture: Secondary | ICD-10-CM

## 2024-02-16 DIAGNOSIS — M5459 Other low back pain: Secondary | ICD-10-CM

## 2024-02-16 DIAGNOSIS — M25561 Pain in right knee: Secondary | ICD-10-CM | POA: Diagnosis not present

## 2024-02-16 NOTE — Therapy (Signed)
 OUTPATIENT PHYSICAL THERAPY LOWER EXTREMITY TREATMENT   Patient Name: Chelsea Bell MRN: [id number] DOB:01-May-1956, 68 y.o., female Today's Date: 02/16/2024  END OF SESSION:  PT End of Session - 02/16/24 1101     Visit Number 15   6 ortho   Authorization Type [employer] Medicare    Progress Note Due on Visit 20    PT Start Time 1012    PT Stop Time 1102    PT Time Calculation (min) 50 min    Activity Tolerance Patient tolerated treatment well    Behavior During Therapy WFL for tasks assessed/performed               Past Medical History:  Diagnosis Date   Cancer (HCC)    endometrial cancer- surgery only   GERD (gastroesophageal reflux disease)    Past Surgical History:  Procedure Laterality Date   ABDOMINAL HYSTERECTOMY     COLONOSCOPY WITH PROPOFOL  N/A 06/09/2014   Procedure: COLONOSCOPY WITH PROPOFOL ;  Surgeon: Lamar Donnald GAILS, MD;  Location: [facility];  Service: Endoscopy;  Laterality: N/A;   DILATION AND CURETTAGE OF UTERUS     LAPAROSCOPIC OVARIAN     diagnostic with 1 ovary removed , then Hysterectomy(endometrial cancer) with other ovary removed   TONSILLECTOMY     Patient Active Problem List   Diagnosis Date Noted   Overactive bladder 10/29/2023   SUI (stress urinary incontinence, female) 10/29/2023   Chest pain 10/07/2021    PCP: Marvine Rush, MD  REFERRING PROVIDER: Marvine Rush, MD  REFERRING DIAG: Rt 26.7 Gait training  THERAPY DIAG:  Chronic pain of both knees  Muscle weakness (generalized)  Other low back pain  Abnormal posture  Rationale for Evaluation and Treatment: Rehabilitation  ONSET DATE:  01/11/2024  SUBJECTIVE:   SUBJECTIVE STATEMENT: My feet and ankles are hurting today.  6/10 with walking.  The pool is going well. I tried the step-ups at home and it gave her some shoulder pain so she will try this later this week.    Eval: Patient presents with pain in her feet and ankles. June 22 she had a bad fall. She had stool  in her bathroom and she tripped over it and she hit her head on the door frame. She has multiple bruises on her head, knee, and ankle. Patient denies a brain bleed and any broken bones. She had a follow up with the orthopedic doctor and she has multiple appointments coming up. She has a concussion and does not get frequent headaches but she gets occasional twinges and sharp pains. She still get very fatigued and she wants to rest majority of the day. Her left knee is her bad knee. She does not use her straight cane in her house, but she used out in the community [employer], appointments etc). She sometimes uses the scooter when she grocery shopes. Her goal is to not use the straight cane. She feels she is too young to be using a straight cane.   PERTINENT HISTORY: Currently in pelvic therapy; hx cancer, B plantar fasciitis (chronic) PAIN:  Are you having pain?PAIN:  Are you having pain? Yes NPRS scale: 6-7/10 Pain location: bil feet and ankles  PAIN TYPE: aching and dull Pain description: intermittent and aching  Aggravating factors: standing and walking  Relieving factors: seated foot massage device, ice bottles, massage gun   PRECAUTIONS: Fall and Other: Concussion   RED FLAGS: None   WEIGHT BEARING RESTRICTIONS: No  FALLS:  Has patient fallen in last  6 months? Yes. Number of falls 1 fall 01/11/2024 see details above  LIVING ENVIRONMENT: Lives with: lives alone Lives in: House/apartment Stairs: Yes: Internal: 3.5 steps; on left going up Has following equipment at home: Single point cane, Shower bench, and Grab bars  OCCUPATION: Not currently working  PLOF: Independent, Independent with basic ADLs, Independent with household mobility without device, Independent with community mobility with device, Independent with gait, Independent with transfers, and Leisure: teaches painting classes   PATIENT GOALS: To get rid of straight cane; To be able to walk up the steps normally; to  be more mobile    NEXT MD VISIT: This week with PCP  OBJECTIVE:  Note: Objective measures were completed at Evaluation unless otherwise noted.  DIAGNOSTIC FINDINGS: Patient had imaging done on 01/11/2024 after fall. Head CT, CT C-spine, Knee, and shoulder imaging was normal no concerns.  PATIENT SURVEYS:  LEFS: 38/80 47.5   COGNITION: Overall cognitive status: Within functional limits for tasks assessed     SENSATION: WFL   POSTURE: rounded shoulders and forward head    LOWER EXTREMITY ROM: WFL bilateral    LOWER EXTREMITY MMT: Grossly 4/5 bilateral     FUNCTIONAL TESTS:  5 times sit to stand: 13.80 sec ; hands on knees; some pain in right hip Timed up and go (TUG): 12.48 sec no straight cane; 11.40 sec with straight cane  02/02/24 748 ft and RPE 6-7/10  - pressure starts but less than 2/10  GAIT: Assistive device utilized: Single point cane Comments: antalgic gait                                                                                                                                TREATMENT DATE:   02/16/24 NuStep: Level 2x 6 minutes-PT present to discuss progress  Seated LAQ x 20 bil 2# added  Seated hamstring and figure 4 stretch 2x20 seconds bil Seated shoulder flexion 1# 2x10 Seated hamstring curls with yellow loop 2x10 Standing heel raises: x10-challenging to do this Seated march: 2# added 2x10- with step up and over pool noodle on floor  Seated ER with 2# 2x10 bil  Weight shifting on balance pad  x1.5 min each    02/13/24:Pt arrives for aquatic physical therapy. Treatment took place in 3.5-5.5 feet of water. Water temperature was. Pt entered the pool via stairs slowly and with heavy use of the hand rail. Pt ambulates to pool deck independently.Pt requires buoyancy of water for support and to offload joints with strengthening exercises.  Seated water bench with 75% submersion Pt performed seated LE AROM exercises 20x in all planes, with  concurrent discussion on status. 75% depth water walking with natural arms 6x in each direction. Gentle hip 3 ways kicks 15x Bil. High knee marching across pool 4x holding yellow noodle for balance. Seated decompression 2 min with 2 noodle support and PTA helping to stabilize her trunk.  02/09/24 NuStep: Level 2x 6  minutes-PT present to discuss progress  Seated LAQ x 20 bil 2# added  Seated heel/toe raise x 20 Seated march: 2# added 2x10 Alternating step tap on 6 step x20 Step up on Rt and Lt with 1 hand rail, and 1 hand on cane x5 each Weight shifting on balance pad  x1.5 min each  Sit to stand hands on knees x 10    PATIENT EDUCATION:  Education details: PT eval findings, anticipated POC, progress with PT, and initial HEP Person educated: Patient Education method: Explanation, Demonstration, and Handouts Education comprehension: verbalized understanding, returned demonstration, and needs further education  HOME EXERCISE PROGRAM: Access Code: [email or web address] URL: [email or web address] Date: 02/16/2024 Prepared by: Burnard  Exercises - Seated Long Arc Quad  - 2 x daily - 7 x weekly - 2 sets - 10 reps - 2 hold - Long Sitting Ankle Pumps  - 1 x daily - 7 x weekly - 2 sets - 10 reps - Seated Heel Raise  - 2 x daily - 7 x weekly - 2 sets - 10 reps - Seated Ankle Alphabet  - 2 x daily - 7 x weekly - 1 sets - 1 reps - Seated March  - 2 x daily - 7 x weekly - 2 sets - 10 reps - Seated Ankle Dorsiflexion AROM  - 2 x daily - 7 x weekly - 2 sets - 10 reps - Forward Step Up with Counter Support  - 1 x daily - 7 x weekly - 1 sets - 5 reps - Seated Hamstring Stretch  - 2 x daily - 7 x weekly - 1 sets - 3 reps - 20 hold - Seated Figure 4 Piriformis Stretch  - 2 x daily - 7 x weekly - 1 sets - 3 reps - 20 hold - Seated Hip External Rotation AROM  - 2 x daily - 7 x weekly - 1-2 sets - 10 reps  Pt Education:   [email or web address]  ASSESSMENT:  CLINICAL IMPRESSION: Pt did well in the pool last session and denies any concussion symptoms immediately following.  She does have intermittent dizziness as a result of concussion.  She tolerates land treatment well with alternating between standing and seated exercises.  PT monitored throughout session for pain, technique and fatigue.  Patient will benefit from skilled PT to address the below impairments and improve overall function.   Eval: Patient is a 68 y.o. female who was seen today for physical therapy evaluation and treatment for gait training. Nathanel presents with chronic bilateral foot and ankle that was re aggravated January 11, 2024 after a fall. She fell in her bathroom and she hit her head on the door threshold. She had imaging down and everything came back unremarkable. She does have a concussion and she is limited in her able to bend and pick up items and she gets occasional sharp pains in her head. She denies frequent headaches. She goal is to initial a gym routine and stay mobile. Patient would benefit from some sessions of aquatic therapy to establish an aquatic program to perform on her own. Patient is motivated and wants to get better. Patient will benefit from skilled PT to address the below impairments and improve overall function.    OBJECTIVE IMPAIRMENTS: Abnormal gait, decreased balance, decreased endurance, difficulty walking, decreased ROM, decreased strength, increased muscle spasms, prosthetic dependency , and pain.   ACTIVITY LIMITATIONS: carrying, lifting, bending, standing, squatting, stairs, transfers, and locomotion level  PARTICIPATION LIMITATIONS: meal prep, cleaning, laundry, interpersonal relationship, shopping,  community activity, and church  PERSONAL FACTORS: Age, Fitness, and Time since onset of injury/illness/exacerbation are also affecting  patient's functional outcome.   REHAB POTENTIAL: Good  CLINICAL DECISION MAKING: Evolving/moderate complexity  EVALUATION COMPLEXITY: Moderate   GOALS: Goals reviewed with patient? Yes  SHORT TERM GOALS: Target date: 02/17/2024  Patient will be independent with initial HEP. Baseline:  Goal status: MET  2.  Patient will be able to participant a to establish a baseline for functional test. Baseline: will test next visit due to foot pain today (02/16/24) Goal status: INITIAL   3.  Patient will perform 5STS in < or = to 12 sec for improved functional mobility. Baseline:  Goal status: INITIAL    LONG TERM GOALS: Target date: 03/16/2024  Patient will demonstrate independence in advanced HEP. Baseline:  Goal status: INITIAL  2.  Patient will report > or = to 50% improvement in ankle and foot pain since starting PT. Baseline:  Goal status: INITIAL  3.  Patient will verbalize and demonstrate self-care strategies to manage pain including tissue mobility practices and change of position. Baseline:  Goal status: INITIAL  4.  Patient will score > or = to 48/80 on LEFS due to improved function. Baseline: 38/80 Goal status: INITIAL  5.  Patient will be able to stand long enough to wash dishes with < or = to 2/10 back pain. Baseline: has to take rests Goal status: INITIAL  6.  Patient will demonstrate proper posture when bending and lifting to prevent back injury. Baseline:  Goal status: INITIAL   PLAN:  PT FREQUENCY: 2x/week  PT DURATION: 8 weeks  PLANNED INTERVENTIONS: 97164- PT Re-evaluation, 97110-Therapeutic exercises, 97530- Therapeutic activity, 97112- Neuromuscular re-education, 97535- Self Care, [id number]- Manual therapy, [phone]- Gait training, [phone]- Canalith repositioning, [id number]- Aquatic Therapy, [phone]- Electrical stimulation (unattended), [phone]- Electrical stimulation (manual), [id number]- Vasopneumatic device, [id number]- Ultrasound, [id number]- Ionotophoresis 4mg /ml  Dexamethasone , 79439 (1-2 muscles), [id number] (3+ muscles)- Dry Needling, Patient/Family education, Balance training, Stair training, Taping, Joint mobilization, Joint manipulation, Spinal manipulation, Spinal mobilization, Vestibular training, Cryotherapy, and Moist heat  PLAN FOR NEXT SESSION: LE strengthening; ankle strengthening & ROM; core strengthening :continue land and aquatics 6 min walk test next   Burnard Joy, PT 02/16/24 11:09 AM     [facility] [address], [address] [place], [place] [address] Phone # [phone] Fax [phone]

## 2024-02-18 ENCOUNTER — Ambulatory Visit: Admitting: Physical Therapy

## 2024-02-18 ENCOUNTER — Encounter: Payer: Self-pay | Admitting: Obstetrics and Gynecology

## 2024-02-18 ENCOUNTER — Ambulatory Visit: Admitting: Obstetrics and Gynecology

## 2024-02-18 VITALS — BP 129/78 | HR 69

## 2024-02-18 DIAGNOSIS — N3281 Overactive bladder: Secondary | ICD-10-CM | POA: Diagnosis not present

## 2024-02-18 MED ORDER — MIRABEGRON ER 50 MG PO TB24: 50.0000 mg | ORAL_TABLET | Freq: Every day | ORAL | 11 refills | Status: AC

## 2024-02-18 NOTE — Patient Instructions (Signed)
 Try pumpkin seed oil capsules to help with bladder urgency.   Continue with Myrbetriq  50mg .

## 2024-02-18 NOTE — Progress Notes (Signed)
 [facility] Return Visit  SUBJECTIVE  History of Present Illness: Chelsea Bell is a 68 y.o. female seen in follow-up for mixed incontinence. Plan at last visit was to start pelvic PT. She has done 9 sessions. She is currently on Myrbetriq  50mg  daily.   No longer has urine running down her leg. She is able to hold her urine longer- every 2-3 hours.  When she gets an urge, she still has to urinate right away. Still leaks a little on the way but now only a small amount. When she wakes up, her bladder is really full but she does not leak over night. Able to wear a thin pad- sometimes wears a thicker one just in case when she is going out.   She still drinks maybe one carbonated beverage/ soda a week. She is rarely drinking tea now. Drinks water most of the time. Tries to stop drinking 1.5 hours before bed.   She fell in June and had a concussion so that got her off track with the PT.   Past Medical History: Patient  has a past medical history of Cancer (HCC), Concussion, and GERD (gastroesophageal reflux disease).   Past Surgical History: She  has a past surgical history that includes Tonsillectomy; Dilation and curettage of uterus; Abdominal hysterectomy; Laparoscopic ovarian; and Colonoscopy with propofol  (N/A, 06/09/2014).   Medications: She has a current medication list which includes the following prescription(s): fluorometholone, fluticasone , mirabegron  er, myrbetriq , OVER THE COUNTER MEDICATION, OVER THE COUNTER MEDICATION, and pantoprazole.   Allergies: Patient is allergic to dilaudid [hydromorphone hcl], oxycodone-acetaminophen , percocet [oxycodone-acetaminophen ], amoxicillin, codeine, and nickel.   Social History: Patient  reports that she has never smoked. She has never used smokeless tobacco. She reports that she does not drink alcohol and does not use drugs.     OBJECTIVE     Physical Exam: Vitals:   02/18/24 1103  BP: 129/78  Pulse: 69   Gen: No  apparent distress, A&O x 3.  Detailed Urogynecologic Evaluation:  Deferred.    ASSESSMENT AND PLAN    Ms. Partridge is a 68 y.o. with:  1. Overactive bladder     Overactive bladder -     Mirabegron  ER; Take 1 tablet (50 mg total) by mouth daily.  Dispense: 30 tablet; Refill: 11   - Refilled Myrbetriq  50mg  - Can try pumpkin seed oil to help with urgency - Continue with pelvic PT as she has had good results  Can follow up 1 year or sooner if needed  Rosaline LOISE Caper, MD

## 2024-02-20 ENCOUNTER — Ambulatory Visit: Attending: Family Medicine | Admitting: Physical Therapy

## 2024-02-20 ENCOUNTER — Encounter: Payer: Self-pay | Admitting: Physical Therapy

## 2024-02-20 DIAGNOSIS — M25562 Pain in left knee: Secondary | ICD-10-CM | POA: Insufficient documentation

## 2024-02-20 DIAGNOSIS — G8929 Other chronic pain: Secondary | ICD-10-CM | POA: Insufficient documentation

## 2024-02-20 DIAGNOSIS — M5459 Other low back pain: Secondary | ICD-10-CM | POA: Diagnosis present

## 2024-02-20 DIAGNOSIS — M6281 Muscle weakness (generalized): Secondary | ICD-10-CM | POA: Diagnosis present

## 2024-02-20 DIAGNOSIS — M25561 Pain in right knee: Secondary | ICD-10-CM | POA: Diagnosis present

## 2024-02-20 DIAGNOSIS — R293 Abnormal posture: Secondary | ICD-10-CM | POA: Diagnosis present

## 2024-02-20 DIAGNOSIS — R252 Cramp and spasm: Secondary | ICD-10-CM | POA: Diagnosis present

## 2024-02-20 DIAGNOSIS — R279 Unspecified lack of coordination: Secondary | ICD-10-CM | POA: Diagnosis present

## 2024-02-20 DIAGNOSIS — R262 Difficulty in walking, not elsewhere classified: Secondary | ICD-10-CM | POA: Diagnosis present

## 2024-02-20 NOTE — Therapy (Signed)
 OUTPATIENT PHYSICAL THERAPY LOWER EXTREMITY TREATMENT   Patient Name: Chelsea Bell MRN: [id number] DOB:June 08, 1956, 68 y.o., female Today's Date: 02/20/2024  END OF SESSION:  PT End of Session - 02/20/24 1449     Visit Number 16    Number of Visits 10    Authorization Type [employer] Medicare    Progress Note Due on Visit 20    PT Start Time 1445    PT Stop Time 1525    PT Time Calculation (min) 40 min    Activity Tolerance Patient tolerated treatment well    Behavior During Therapy WFL for tasks assessed/performed                Past Medical History:  Diagnosis Date   Cancer (HCC)    endometrial cancer- surgery only   Concussion    GERD (gastroesophageal reflux disease)    Past Surgical History:  Procedure Laterality Date   ABDOMINAL HYSTERECTOMY     COLONOSCOPY WITH PROPOFOL  N/A 06/09/2014   Procedure: COLONOSCOPY WITH PROPOFOL ;  Surgeon: Lamar Donnald GAILS, MD;  Location: [facility];  Service: Endoscopy;  Laterality: N/A;   DILATION AND CURETTAGE OF UTERUS     LAPAROSCOPIC OVARIAN     diagnostic with 1 ovary removed , then Hysterectomy(endometrial cancer) with other ovary removed   TONSILLECTOMY     Patient Active Problem List   Diagnosis Date Noted   Overactive bladder 10/29/2023   SUI (stress urinary incontinence, female) 10/29/2023   Chest pain 10/07/2021    PCP: Marvine Rush, MD  REFERRING PROVIDER: Marvine Rush, MD  REFERRING DIAG: Rt 26.7 Gait training  THERAPY DIAG:  Chronic pain of both knees  Muscle weakness (generalized)  Other low back pain  Abnormal posture  Unspecified lack of coordination  Rationale for Evaluation and Treatment: Rehabilitation  ONSET DATE:  01/11/2024  SUBJECTIVE:   SUBJECTIVE STATEMENT: Good week other than Tuesday.   Eval: Patient presents with pain in her feet and ankles. June 22 she had a bad fall. She had stool in her bathroom and she tripped over it and she hit her head on the door frame. She has  multiple bruises on her head, knee, and ankle. Patient denies a brain bleed and any broken bones. She had a follow up with the orthopedic doctor and she has multiple appointments coming up. She has a concussion and does not get frequent headaches but she gets occasional twinges and sharp pains. She still get very fatigued and she wants to rest majority of the day. Her left knee is her bad knee. She does not use her straight cane in her house, but she used out in the community [employer], appointments etc). She sometimes uses the scooter when she grocery shopes. Her goal is to not use the straight cane. She feels she is too young to be using a straight cane.   PERTINENT HISTORY: Currently in pelvic therapy; hx cancer, B plantar fasciitis (chronic) PAIN:  Are you having pain?PAIN:  Are you having pain? None right now NPRS scale:/10 Pain location: bil feet and ankles  PAIN TYPE: aching and dull Pain description: intermittent and aching  Aggravating factors: standing and walking  Relieving factors: seated foot massage device, ice bottles, massage gun   PRECAUTIONS: Fall and Other: Concussion   RED FLAGS: None   WEIGHT BEARING RESTRICTIONS: No  FALLS:  Has patient fallen in last 6 months? Yes. Number of falls 1 fall 01/11/2024 see details above  LIVING ENVIRONMENT: Lives with: lives alone  Lives in: House/apartment Stairs: Yes: Internal: 3.5 steps; on left going up Has following equipment at home: Single point cane, Shower bench, and Grab bars  OCCUPATION: Not currently working  PLOF: Independent, Independent with basic ADLs, Independent with household mobility without device, Independent with community mobility with device, Independent with gait, Independent with transfers, and Leisure: teaches painting classes   PATIENT GOALS: To get rid of straight cane; To be able to walk up the steps normally; to be more mobile    NEXT MD VISIT: This week with PCP  OBJECTIVE:  Note:  Objective measures were completed at Evaluation unless otherwise noted.  DIAGNOSTIC FINDINGS: Patient had imaging done on 01/11/2024 after fall. Head CT, CT C-spine, Knee, and shoulder imaging was normal no concerns.  PATIENT SURVEYS:  LEFS: 38/80 47.5   COGNITION: Overall cognitive status: Within functional limits for tasks assessed     SENSATION: WFL   POSTURE: rounded shoulders and forward head    LOWER EXTREMITY ROM: WFL bilateral    LOWER EXTREMITY MMT: Grossly 4/5 bilateral     FUNCTIONAL TESTS:  5 times sit to stand: 13.80 sec ; hands on knees; some pain in right hip Timed up and go (TUG): 12.48 sec no straight cane; 11.40 sec with straight cane  02/02/24 748 ft and RPE 6-7/10  - pressure starts but less than 2/10  GAIT: Assistive device utilized: Single point cane Comments: antalgic gait                                                                                                                                TREATMENT DATE:   02/20/24:Pt arrives for aquatic physical therapy. Treatment took place in 3.5-5.5 feet of water. Water temperature was 91 degrees F. Pt entered the pool via stairs slowly and with heavy use of the hand rail. Pt ambulates to pool deck independently.Pt requires buoyancy of water for support and to offload joints with strengthening exercises.  Seated water bench with 75% submersion Pt performed seated LE AROM exercises 20x in all planes, with concurrent discussion on status. 75% depth water walking with natural arms 8x in each direction. Gentle hip 3 ways kicks 20x Bil. High knee marching across pool 6x holding yellow noodle for balance. Core lat press with 1 hand float 2x5.  02/16/24 NuStep: Level 2x 6 minutes-PT present to discuss progress  Seated LAQ x 20 bil 2# added  Seated hamstring and figure 4 stretch 2x20 seconds bil Seated shoulder flexion 1# 2x10 Seated hamstring curls with yellow loop 2x10 Standing heel raises:  x10-challenging to do this Seated march: 2# added 2x10- with step up and over pool noodle on floor  Seated ER with 2# 2x10 bil  Weight shifting on balance pad  x1.5 min each    02/13/24:Pt arrives for aquatic physical therapy. Treatment took place in 3.5-5.5 feet of water. Water temperature was. Pt entered the pool via stairs slowly and with heavy use  of the hand rail. Pt ambulates to pool deck independently.Pt requires buoyancy of water for support and to offload joints with strengthening exercises.  Seated water bench with 75% submersion Pt performed seated LE AROM exercises 20x in all planes, with concurrent discussion on status. 75% depth water walking with natural arms 6x in each direction. Gentle hip 3 ways kicks 15x Bil. High knee marching across pool 4x holding yellow noodle for balance. Seated decompression 2 min with 2 noodle support and PTA helping to stabilize her trunk.  PATIENT EDUCATION:  Education details: PT eval findings, anticipated POC, progress with PT, and initial HEP Person educated: Patient Education method: Explanation, Demonstration, and Handouts Education comprehension: verbalized understanding, returned demonstration, and needs further education  HOME EXERCISE PROGRAM: Access Code: [email or web address] URL: [email or web address] Date: 02/16/2024 Prepared by: Burnard  Exercises - Seated Long Arc Quad  - 2 x daily - 7 x weekly - 2 sets - 10 reps - 2 hold - Long Sitting Ankle Pumps  - 1 x daily - 7 x weekly - 2 sets - 10 reps - Seated Heel Raise  - 2 x daily - 7 x weekly - 2 sets - 10 reps - Seated Ankle Alphabet  - 2 x daily - 7 x weekly - 1 sets - 1 reps - Seated March  - 2 x daily - 7 x weekly - 2 sets - 10 reps - Seated Ankle Dorsiflexion AROM  - 2 x daily - 7 x weekly - 2 sets - 10 reps - Forward Step Up with Counter Support  - 1 x daily - 7 x weekly - 1 sets - 5 reps - Seated Hamstring Stretch  - 2 x daily - 7 x weekly - 1 sets - 3 reps - 20 hold - Seated  Figure 4 Piriformis Stretch  - 2 x daily - 7 x weekly - 1 sets - 3 reps - 20 hold - Seated Hip External Rotation AROM  - 2 x daily - 7 x weekly - 1-2 sets - 10 reps  Pt Education:  [email or web address]  ASSESSMENT:  CLINICAL IMPRESSION: Small progressions today, tolerated them very well. Pt does get sore in her hips after her exercises so we will remain at a gradual pace with her progression. No concussion symptoms today.   Eval: Patient is a 67 y.o. female who was seen today for physical therapy evaluation and treatment for gait training. Nathanel presents with chronic bilateral foot and ankle that was re aggravated January 11, 2024 after a fall. She fell in her bathroom and she hit her head on the door threshold. She had imaging down and everything came back unremarkable. She does have a concussion and she is limited in her able to bend and pick up items and she gets occasional sharp pains in her head. She denies frequent headaches. She goal is to initial a gym routine and stay mobile. Patient would benefit from some sessions of aquatic therapy to establish an aquatic program to perform on her own. Patient is motivated and wants to get better. Patient will benefit from skilled PT to address the below impairments and improve overall function.    OBJECTIVE IMPAIRMENTS: Abnormal gait, decreased balance, decreased endurance, difficulty walking, decreased ROM, decreased strength, increased muscle spasms, prosthetic dependency , and pain.   ACTIVITY LIMITATIONS: carrying, lifting, bending, standing, squatting, stairs, transfers, and locomotion level  PARTICIPATION LIMITATIONS: meal prep, cleaning, laundry, interpersonal relationship, shopping, community activity, and church  PERSONAL FACTORS: Age, Fitness, and Time since  onset of injury/illness/exacerbation are also affecting patient's functional outcome.   REHAB  POTENTIAL: Good  CLINICAL DECISION MAKING: Evolving/moderate complexity  EVALUATION COMPLEXITY: Moderate   GOALS: Goals reviewed with patient? Yes  SHORT TERM GOALS: Target date: 02/17/2024  Patient will be independent with initial HEP. Baseline:  Goal status: MET  2.  Patient will be able to participant a to establish a baseline for functional test. Baseline: will test next visit due to foot pain today (02/16/24) Goal status: INITIAL   3.  Patient will perform 5STS in < or = to 12 sec for improved functional mobility. Baseline:  Goal status: INITIAL    LONG TERM GOALS: Target date: 03/16/2024  Patient will demonstrate independence in advanced HEP. Baseline:  Goal status: INITIAL  2.  Patient will report > or = to 50% improvement in ankle and foot pain since starting PT. Baseline:  Goal status: INITIAL  3.  Patient will verbalize and demonstrate self-care strategies to manage pain including tissue mobility practices and change of position. Baseline:  Goal status: INITIAL  4.  Patient will score > or = to 48/80 on LEFS due to improved function. Baseline: 38/80 Goal status: INITIAL  5.  Patient will be able to stand long enough to wash dishes with < or = to 2/10 back pain. Baseline: has to take rests Goal status: INITIAL  6.  Patient will demonstrate proper posture when bending and lifting to prevent back injury. Baseline:  Goal status: INITIAL   PLAN:  PT FREQUENCY: 2x/week  PT DURATION: 8 weeks  PLANNED INTERVENTIONS: 97164- PT Re-evaluation, 97110-Therapeutic exercises, 97530- Therapeutic activity, 97112- Neuromuscular re-education, 97535- Self Care, [id number]- Manual therapy, [phone]- Gait training, [phone]- Canalith repositioning, [id number]- Aquatic Therapy, [phone]- Electrical stimulation (unattended), [phone]- Electrical stimulation (manual), [id number]- Vasopneumatic device, [id number]- Ultrasound, [id number]- Ionotophoresis 4mg /ml Dexamethasone , 79439 (1-2 muscles), [id number] (3+  muscles)- Dry Needling, Patient/Family education, Balance training, Stair training, Taping, Joint mobilization, Joint manipulation, Spinal manipulation, Spinal mobilization, Vestibular training, Cryotherapy, and Moist heat  PLAN FOR NEXT SESSION: LE strengthening; ankle strengthening & ROM; core strengthening :continue land and aquatics 6 min walk test next   Delon Darner, PTA 02/20/24 3:31 PM     [facility] [address] [place], [place] [address] Phone # [phone] Fax [phone]

## 2024-02-24 ENCOUNTER — Encounter: Payer: Self-pay | Admitting: Physical Therapy

## 2024-02-24 ENCOUNTER — Ambulatory Visit: Admitting: Physical Therapy

## 2024-02-24 DIAGNOSIS — M5459 Other low back pain: Secondary | ICD-10-CM

## 2024-02-24 DIAGNOSIS — M6281 Muscle weakness (generalized): Secondary | ICD-10-CM

## 2024-02-24 DIAGNOSIS — R293 Abnormal posture: Secondary | ICD-10-CM

## 2024-02-24 DIAGNOSIS — M25561 Pain in right knee: Secondary | ICD-10-CM | POA: Diagnosis not present

## 2024-02-24 DIAGNOSIS — G8929 Other chronic pain: Secondary | ICD-10-CM

## 2024-02-24 DIAGNOSIS — R279 Unspecified lack of coordination: Secondary | ICD-10-CM

## 2024-02-24 NOTE — Therapy (Signed)
 OUTPATIENT PHYSICAL THERAPY LOWER EXTREMITY TREATMENT   Patient Name: Chelsea Bell MRN: [id number] DOB:12-Dec-1955, 68 y.o., female Today's Date: 02/24/2024  END OF SESSION:  PT End of Session - 02/24/24 1721     Visit Number 17    Authorization Type [employer] Medicare    Progress Note Due on Visit 20    PT Start Time 1531    PT Stop Time 1615    PT Time Calculation (min) 44 min    Activity Tolerance Patient tolerated treatment well    Behavior During Therapy WFL for tasks assessed/performed                 Past Medical History:  Diagnosis Date   Cancer (HCC)    endometrial cancer- surgery only   Concussion    GERD (gastroesophageal reflux disease)    Past Surgical History:  Procedure Laterality Date   ABDOMINAL HYSTERECTOMY     COLONOSCOPY WITH PROPOFOL  N/A 06/09/2014   Procedure: COLONOSCOPY WITH PROPOFOL ;  Surgeon: Lamar Donnald GAILS, MD;  Location: [facility];  Service: Endoscopy;  Laterality: N/A;   DILATION AND CURETTAGE OF UTERUS     LAPAROSCOPIC OVARIAN     diagnostic with 1 ovary removed , then Hysterectomy(endometrial cancer) with other ovary removed   TONSILLECTOMY     Patient Active Problem List   Diagnosis Date Noted   Overactive bladder 10/29/2023   SUI (stress urinary incontinence, female) 10/29/2023   Chest pain 10/07/2021    PCP: Marvine Rush, MD  REFERRING PROVIDER: Marvine Rush, MD  REFERRING DIAG: Rt 26.7 Gait training  THERAPY DIAG:  Chronic pain of both knees  Muscle weakness (generalized)  Other low back pain  Abnormal posture  Unspecified lack of coordination  Rationale for Evaluation and Treatment: Rehabilitation  ONSET DATE:  01/11/2024  SUBJECTIVE:   SUBJECTIVE STATEMENT: Patient reports yesterday she was very dizzy yesterday. This morning she still have some lingering dizziness. Dizziness is getting better and not as often but it is not going away. Knee pain is okay today.  Eval: Patient presents with pain  in her feet and ankles. June 22 she had a bad fall. She had stool in her bathroom and she tripped over it and she hit her head on the door frame. She has multiple bruises on her head, knee, and ankle. Patient denies a brain bleed and any broken bones. She had a follow up with the orthopedic doctor and she has multiple appointments coming up. She has a concussion and does not get frequent headaches but she gets occasional twinges and sharp pains. She still get very fatigued and she wants to rest majority of the day. Her left knee is her bad knee. She does not use her straight cane in her house, but she used out in the community [employer], appointments etc). She sometimes uses the scooter when she grocery shopes. Her goal is to not use the straight cane. She feels she is too young to be using a straight cane.   PERTINENT HISTORY: Currently in pelvic therapy; hx cancer, B plantar fasciitis (chronic) PAIN:  Are you having pain?PAIN:  Are you having pain? None right now NPRS scale:/10 Pain location: bil feet and ankles  PAIN TYPE: aching and dull Pain description: intermittent and aching  Aggravating factors: standing and walking  Relieving factors: seated foot massage device, ice bottles, massage gun   PRECAUTIONS: Fall and Other: Concussion   RED FLAGS: None   WEIGHT BEARING RESTRICTIONS: No  FALLS:  Has  patient fallen in last 6 months? Yes. Number of falls 1 fall 01/11/2024 see details above  LIVING ENVIRONMENT: Lives with: lives alone Lives in: House/apartment Stairs: Yes: Internal: 3.5 steps; on left going up Has following equipment at home: Single point cane, Shower bench, and Grab bars  OCCUPATION: Not currently working  PLOF: Independent, Independent with basic ADLs, Independent with household mobility without device, Independent with community mobility with device, Independent with gait, Independent with transfers, and Leisure: teaches painting classes   PATIENT GOALS: To  get rid of straight cane; To be able to walk up the steps normally; to be more mobile    NEXT MD VISIT: This week with PCP  OBJECTIVE:  Note: Objective measures were completed at Evaluation unless otherwise noted.  DIAGNOSTIC FINDINGS: Patient had imaging done on 01/11/2024 after fall. Head CT, CT C-spine, Knee, and shoulder imaging was normal no concerns.  PATIENT SURVEYS:  LEFS: 38/80 47.5   COGNITION: Overall cognitive status: Within functional limits for tasks assessed     SENSATION: WFL   POSTURE: rounded shoulders and forward head    LOWER EXTREMITY ROM: WFL bilateral    LOWER EXTREMITY MMT: Grossly 4/5 bilateral     FUNCTIONAL TESTS:  5 times sit to stand: 13.80 sec ; hands on knees; some pain in right hip Timed up and go (TUG): 12.48 sec no straight cane; 11.40 sec with straight cane  02/02/24 748 ft and RPE 6-7/10  - pressure starts but less than 2/10  GAIT: Assistive device utilized: Single point cane Comments: antalgic gait                                                                                                                                TREATMENT DATE:  02/24/24 Discussion of dizziness and knee pain NuStep: Level 2x 7 minutes-PT present to discuss progress Step ups at stair (using left railing and str cane for support. This is how patient performs it at home she only has one railing) x 5 bilateral   Step ups at stair using bilateral UE support x 8 bilateral  Seated hamstring curls with yellow loop 2x10 Patient education on knee replacements and when to know one might be needed. Seated LAQ x 10 bil 3# added  Seated hamstring and figure 4 stretch 2x20 seconds bil     02/20/24:Pt arrives for aquatic physical therapy. Treatment took place in 3.5-5.5 feet of water. Water temperature was 91 degrees F. Pt entered the pool via stairs slowly and with heavy use of the hand rail. Pt ambulates to pool deck independently.Pt requires buoyancy of water  for support and to offload joints with strengthening exercises.  Seated water bench with 75% submersion Pt performed seated LE AROM exercises 20x in all planes, with concurrent discussion on status. 75% depth water walking with natural arms 8x in each direction. Gentle hip 3 ways kicks 20x Bil. High knee marching across pool 6x holding yellow  noodle for balance. Core lat press with 1 hand float 2x5.  02/16/24 NuStep: Level 2x 6 minutes-PT present to discuss progress  Seated LAQ x 20 bil 2# added  Seated hamstring and figure 4 stretch 2x20 seconds bil Seated shoulder flexion 1# 2x10 Seated hamstring curls with yellow loop 2x10 Standing heel raises: x10-challenging to do this Seated march: 2# added 2x10- with step up and over pool noodle on floor  Seated ER with 2# 2x10 bil  Weight shifting on balance pad  x1.5 min each    PATIENT EDUCATION:  Education details: PT eval findings, anticipated POC, progress with PT, and initial HEP Person educated: Patient Education method: Explanation, Demonstration, and Handouts Education comprehension: verbalized understanding, returned demonstration, and needs further education  HOME EXERCISE PROGRAM: Access Code: [email or web address] URL: [email or web address] Date: 02/16/2024 Prepared by: Burnard  Exercises - Seated Long Arc Quad  - 2 x daily - 7 x weekly - 2 sets - 10 reps - 2 hold - Long Sitting Ankle Pumps  - 1 x daily - 7 x weekly - 2 sets - 10 reps - Seated Heel Raise  - 2 x daily - 7 x weekly - 2 sets - 10 reps - Seated Ankle Alphabet  - 2 x daily - 7 x weekly - 1 sets - 1 reps - Seated March  - 2 x daily - 7 x weekly - 2 sets - 10 reps - Seated Ankle Dorsiflexion AROM  - 2 x daily - 7 x weekly - 2 sets - 10 reps - Forward Step Up with Counter Support  - 1 x daily - 7 x weekly - 1 sets - 5 reps - Seated Hamstring Stretch  - 2 x daily - 7 x weekly - 1 sets - 3 reps - 20 hold - Seated Figure 4 Piriformis Stretch  - 2 x daily - 7 x weekly - 1  sets - 3 reps - 20 hold - Seated Hip External Rotation AROM  - 2 x daily - 7 x weekly - 1-2 sets - 10 reps  Pt Education:  [email or web address]  ASSESSMENT:  CLINICAL IMPRESSION:  Nathanel presents to therapy with no pain, but her dizziness was not good yesterday. Discussed her symptoms and energy conservation techniques. Incorporated step ups today and patient tolerated exercise well. When performing at home she uses her str cane to so performed exercise both ways. Patient performed exercise better when using bilateral UE support. Discussed knee replacements with patient and when you know it's time to consider getting one. Right now patient verbalized not wanting to get one. She is progressing appropriately with skilled therapy and would continue to benefit to meet goals and improve function.   Eval: Patient is a 68 y.o. female who was seen today for physical therapy evaluation and treatment for gait training. Nathanel presents with chronic bilateral foot and ankle that was re aggravated January 11, 2024 after a fall. She fell in her bathroom and she hit her head on the door threshold. She had imaging down and everything came back unremarkable. She does have a concussion and she is limited in her able to bend and pick up items and she gets occasional sharp pains in her head. She denies frequent headaches. She goal is to initial a gym routine and stay mobile. Patient would benefit from some sessions of aquatic therapy to establish an aquatic program to perform on her own. Patient is motivated and wants to get better. Patient will benefit from skilled PT to address  the below impairments and improve overall function.    OBJECTIVE IMPAIRMENTS: Abnormal gait, decreased balance, decreased endurance, difficulty walking, decreased ROM, decreased strength, increased muscle spasms, prosthetic dependency , and pain.   ACTIVITY  LIMITATIONS: carrying, lifting, bending, standing, squatting, stairs, transfers, and locomotion level  PARTICIPATION LIMITATIONS: meal prep, cleaning, laundry, interpersonal relationship, shopping, community activity, and church  PERSONAL FACTORS: Age, Fitness, and Time since onset of injury/illness/exacerbation are also affecting patient's functional outcome.   REHAB POTENTIAL: Good  CLINICAL DECISION MAKING: Evolving/moderate complexity  EVALUATION COMPLEXITY: Moderate   GOALS: Goals reviewed with patient? Yes  SHORT TERM GOALS: Target date: 02/17/2024  Patient will be independent with initial HEP. Baseline:  Goal status: MET  2.  Patient will be able to participant a to establish a baseline for functional test. Baseline: will test next visit due to foot pain today (02/16/24) Goal status: INITIAL   3.  Patient will perform 5STS in < or = to 12 sec for improved functional mobility. Baseline:  Goal status: INITIAL    LONG TERM GOALS: Target date: 03/16/2024  Patient will demonstrate independence in advanced HEP. Baseline:  Goal status: INITIAL  2.  Patient will report > or = to 50% improvement in ankle and foot pain since starting PT. Baseline:  Goal status: INITIAL  3.  Patient will verbalize and demonstrate self-care strategies to manage pain including tissue mobility practices and change of position. Baseline:  Goal status: INITIAL  4.  Patient will score > or = to 48/80 on LEFS due to improved function. Baseline: 38/80 Goal status: INITIAL  5.  Patient will be able to stand long enough to wash dishes with < or = to 2/10 back pain. Baseline: has to take rests Goal status: INITIAL  6.  Patient will demonstrate proper posture when bending and lifting to prevent back injury. Baseline:  Goal status: INITIAL   PLAN:  PT FREQUENCY: 2x/week  PT DURATION: 8 weeks  PLANNED INTERVENTIONS: 97164- PT Re-evaluation, 97110-Therapeutic exercises, 97530-  Therapeutic activity, 97112- Neuromuscular re-education, 97535- Self Care, [id number]- Manual therapy, [phone]- Gait training, [phone]- Canalith repositioning, [id number]- Aquatic Therapy, [phone]- Electrical stimulation (unattended), [phone]- Electrical stimulation (manual), [id number]- Vasopneumatic device, [id number]- Ultrasound, [id number]- Ionotophoresis 4mg /ml Dexamethasone , 79439 (1-2 muscles), [id number] (3+ muscles)- Dry Needling, Patient/Family education, Balance training, Stair training, Taping, Joint mobilization, Joint manipulation, Spinal manipulation, Spinal mobilization, Vestibular training, Cryotherapy, and Moist heat  PLAN FOR NEXT SESSION: LE strengthening; ankle strengthening & ROM; core strengthening :continue land and aquatics 6 min walk test next      Kristeen Sar, PT 02/24/24 5:21 PM [facility] [address], [address] [place], [place] [address] Phone # [phone] Fax [phone]

## 2024-02-26 ENCOUNTER — Encounter

## 2024-02-27 ENCOUNTER — Ambulatory Visit: Payer: Self-pay | Admitting: Physical Therapy

## 2024-02-27 ENCOUNTER — Encounter: Payer: Self-pay | Admitting: Physical Therapy

## 2024-02-27 DIAGNOSIS — M25561 Pain in right knee: Secondary | ICD-10-CM | POA: Diagnosis not present

## 2024-02-27 DIAGNOSIS — R293 Abnormal posture: Secondary | ICD-10-CM

## 2024-02-27 DIAGNOSIS — R279 Unspecified lack of coordination: Secondary | ICD-10-CM

## 2024-02-27 DIAGNOSIS — M5459 Other low back pain: Secondary | ICD-10-CM

## 2024-02-27 DIAGNOSIS — G8929 Other chronic pain: Secondary | ICD-10-CM

## 2024-02-27 DIAGNOSIS — M6281 Muscle weakness (generalized): Secondary | ICD-10-CM

## 2024-02-27 NOTE — Therapy (Signed)
 OUTPATIENT PHYSICAL THERAPY LOWER EXTREMITY TREATMENT   Patient Name: Chelsea Bell MRN: [id number] DOB:1956-07-21, 68 y.o., female Today's Date: 02/27/2024  END OF SESSION:  PT End of Session - 02/27/24 1445     Visit Number 18    Authorization Type [employer] Medicare    Progress Note Due on Visit 20    PT Start Time 1445    PT Stop Time 1525    PT Time Calculation (min) 40 min    Activity Tolerance Patient tolerated treatment well    Behavior During Therapy WFL for tasks assessed/performed                  Past Medical History:  Diagnosis Date   Cancer (HCC)    endometrial cancer- surgery only   Concussion    GERD (gastroesophageal reflux disease)    Past Surgical History:  Procedure Laterality Date   ABDOMINAL HYSTERECTOMY     COLONOSCOPY WITH PROPOFOL  N/A 06/09/2014   Procedure: COLONOSCOPY WITH PROPOFOL ;  Surgeon: Lamar Donnald GAILS, MD;  Location: [facility];  Service: Endoscopy;  Laterality: N/A;   DILATION AND CURETTAGE OF UTERUS     LAPAROSCOPIC OVARIAN     diagnostic with 1 ovary removed , then Hysterectomy(endometrial cancer) with other ovary removed   TONSILLECTOMY     Patient Active Problem List   Diagnosis Date Noted   Overactive bladder 10/29/2023   SUI (stress urinary incontinence, female) 10/29/2023   Chest pain 10/07/2021    PCP: Marvine Rush, MD  REFERRING PROVIDER: Marvine Rush, MD  REFERRING DIAG: Rt 26.7 Gait training  THERAPY DIAG:  Chronic pain of both knees  Muscle weakness (generalized)  Other low back pain  Abnormal posture  Unspecified lack of coordination  Rationale for Evaluation and Treatment: Rehabilitation  ONSET DATE:  01/11/2024  SUBJECTIVE:   SUBJECTIVE STATEMENT: I think I did too big of movements in the pool last time that it made my back hurt. Today I will pay more attention to do smaller motions.   Eval: Patient presents with pain in her feet and ankles. June 22 she had a bad fall. She had stool  in her bathroom and she tripped over it and she hit her head on the door frame. She has multiple bruises on her head, knee, and ankle. Patient denies a brain bleed and any broken bones. She had a follow up with the orthopedic doctor and she has multiple appointments coming up. She has a concussion and does not get frequent headaches but she gets occasional twinges and sharp pains. She still get very fatigued and she wants to rest majority of the day. Her left knee is her bad knee. She does not use her straight cane in her house, but she used out in the community [employer], appointments etc). She sometimes uses the scooter when she grocery shopes. Her goal is to not use the straight cane. She feels she is too young to be using a straight cane.   PERTINENT HISTORY: Currently in pelvic therapy; hx cancer, B plantar fasciitis (chronic) PAIN:  Are you having pain?PAIN:  Are you having pain? Yes, low back NPRS scale:/[phone] Pain location:low back  PAIN TYPE: aching and dull Pain description: intermittent and aching  Aggravating factors: over doing it Relieving factors: seated foot massage device, ice bottles, massage gun   PRECAUTIONS: Fall and Other: Concussion   RED FLAGS: None   WEIGHT BEARING RESTRICTIONS: No  FALLS:  Has patient fallen in last 6 months? Yes. Number  of falls 1 fall 01/11/2024 see details above  LIVING ENVIRONMENT: Lives with: lives alone Lives in: House/apartment Stairs: Yes: Internal: 3.5 steps; on left going up Has following equipment at home: Single point cane, Shower bench, and Grab bars  OCCUPATION: Not currently working  PLOF: Independent, Independent with basic ADLs, Independent with household mobility without device, Independent with community mobility with device, Independent with gait, Independent with transfers, and Leisure: teaches painting classes   PATIENT GOALS: To get rid of straight cane; To be able to walk up the steps normally; to be more  mobile    NEXT MD VISIT: This week with PCP  OBJECTIVE:  Note: Objective measures were completed at Evaluation unless otherwise noted.  DIAGNOSTIC FINDINGS: Patient had imaging done on 01/11/2024 after fall. Head CT, CT C-spine, Knee, and shoulder imaging was normal no concerns.  PATIENT SURVEYS:  LEFS: 38/80 47.5   COGNITION: Overall cognitive status: Within functional limits for tasks assessed     SENSATION: WFL   POSTURE: rounded shoulders and forward head    LOWER EXTREMITY ROM: WFL bilateral    LOWER EXTREMITY MMT: Grossly 4/5 bilateral     FUNCTIONAL TESTS:  5 times sit to stand: 13.80 sec ; hands on knees; some pain in right hip Timed up and go (TUG): 12.48 sec no straight cane; 11.40 sec with straight cane  02/02/24 748 ft and RPE 6-7/10  - pressure starts but less than 2/10  GAIT: Assistive device utilized: Single point cane Comments: antalgic gait                                                                                                                                TREATMENT DATE:   02/27/24:Pt arrives for aquatic physical therapy. Treatment took place in 3.5-5.5 feet of water. Water temperature was 91 degrees F. Pt entered the pool via stairs slowly and with heavy use of the hand rail. Pt ambulates to pool deck independently.Pt requires buoyancy of water for support and to offload joints with strengthening exercises.  Seated water bench with 75% submersion Pt performed seated LE AROM exercises 20x in all planes, with concurrent discussion on status. 75% depth water walking with teal noodle for light back support 8x in each direction. Gentle hip 3 ways kicks 20x Bil. High knee marching across pool 6x holding yellow noodle for balance. Core lat press with 1 hand float 2x10. Attempted some low back stretching but never could get a good stretch, pt felt her calf tightness was most prominent.   02/24/24 Discussion of dizziness and knee pain NuStep: Level  2x 7 minutes-PT present to discuss progress Step ups at stair (using left railing and str cane for support. This is how patient performs it at home she only has one railing) x 5 bilateral   Step ups at stair using bilateral UE support x 8 bilateral  Seated hamstring curls with yellow loop 2x10 Patient education on knee  replacements and when to know one might be needed. Seated LAQ x 10 bil 3# added  Seated hamstring and figure 4 stretch 2x20 seconds bil     02/20/24:Pt arrives for aquatic physical therapy. Treatment took place in 3.5-5.5 feet of water. Water temperature was 91 degrees F. Pt entered the pool via stairs slowly and with heavy use of the hand rail. Pt ambulates to pool deck independently.Pt requires buoyancy of water for support and to offload joints with strengthening exercises.  Seated water bench with 75% submersion Pt performed seated LE AROM exercises 20x in all planes, with concurrent discussion on status. 75% depth water walking with natural arms 8x in each direction. Gentle hip 3 ways kicks 20x Bil. High knee marching across pool 6x holding yellow noodle for balance. Core lat press with 1 hand float 2x5.   PATIENT EDUCATION:  Education details: PT eval findings, anticipated POC, progress with PT, and initial HEP Person educated: Patient Education method: Explanation, Demonstration, and Handouts Education comprehension: verbalized understanding, returned demonstration, and needs further education  HOME EXERCISE PROGRAM: Access Code: [email or web address] URL: [email or web address] Date: 02/16/2024 Prepared by: Burnard  Exercises - Seated Long Arc Quad  - 2 x daily - 7 x weekly - 2 sets - 10 reps - 2 hold - Long Sitting Ankle Pumps  - 1 x daily - 7 x weekly - 2 sets - 10 reps - Seated Heel Raise  - 2 x daily - 7 x weekly - 2 sets - 10 reps - Seated Ankle Alphabet  - 2 x daily - 7 x weekly - 1 sets - 1 reps - Seated March  - 2 x daily - 7 x weekly - 2 sets - 10 reps -  Seated Ankle Dorsiflexion AROM  - 2 x daily - 7 x weekly - 2 sets - 10 reps - Forward Step Up with Counter Support  - 1 x daily - 7 x weekly - 1 sets - 5 reps - Seated Hamstring Stretch  - 2 x daily - 7 x weekly - 1 sets - 3 reps - 20 hold - Seated Figure 4 Piriformis Stretch  - 2 x daily - 7 x weekly - 1 sets - 3 reps - 20 hold - Seated Hip External Rotation AROM  - 2 x daily - 7 x weekly - 1-2 sets - 10 reps  Pt Education:  [email or web address]  ASSESSMENT:  CLINICAL IMPRESSION: Pt had some low back pain after her last pool session. She attributes it to making too large of leg movements during her wall exercises. Today she was more mindful to control those movements better. No dizziness today.    Eval: Patient is a 68 y.o. female who was seen today for physical therapy evaluation and treatment for gait training. Nathanel presents with chronic bilateral foot and ankle that was re aggravated January 11, 2024 after a fall. She fell in her bathroom and she hit her head on the door threshold. She had imaging down and everything came back unremarkable. She does have a concussion and she is limited in her able to bend and pick up items and she gets occasional sharp pains in her head. She denies frequent headaches. She goal is to initial a gym routine and stay mobile. Patient would benefit from some sessions of aquatic therapy to establish an aquatic program to perform on her own. Patient is motivated and wants to get better. Patient will benefit from skilled PT to address the below impairments and improve overall  function.    OBJECTIVE IMPAIRMENTS: Abnormal gait, decreased balance, decreased endurance, difficulty walking, decreased ROM, decreased strength, increased muscle spasms, prosthetic dependency , and pain.   ACTIVITY LIMITATIONS: carrying, lifting, bending, standing, squatting, stairs, transfers, and locomotion  level  PARTICIPATION LIMITATIONS: meal prep, cleaning, laundry, interpersonal relationship, shopping, community activity, and church  PERSONAL FACTORS: Age, Fitness, and Time since onset of injury/illness/exacerbation are also affecting patient's functional outcome.   REHAB POTENTIAL: Good  CLINICAL DECISION MAKING: Evolving/moderate complexity  EVALUATION COMPLEXITY: Moderate   GOALS: Goals reviewed with patient? Yes  SHORT TERM GOALS: Target date: 02/17/2024  Patient will be independent with initial HEP. Baseline:  Goal status: MET  2.  Patient will be able to participant a to establish a baseline for functional test. Baseline: will test next visit due to foot pain today (02/16/24) Goal status: INITIAL   3.  Patient will perform 5STS in < or = to 12 sec for improved functional mobility. Baseline:  Goal status: INITIAL    LONG TERM GOALS: Target date: 03/16/2024  Patient will demonstrate independence in advanced HEP. Baseline:  Goal status: INITIAL  2.  Patient will report > or = to 50% improvement in ankle and foot pain since starting PT. Baseline:  Goal status: INITIAL  3.  Patient will verbalize and demonstrate self-care strategies to manage pain including tissue mobility practices and change of position. Baseline:  Goal status: INITIAL  4.  Patient will score > or = to 48/80 on LEFS due to improved function. Baseline: 38/80 Goal status: INITIAL  5.  Patient will be able to stand long enough to wash dishes with < or = to 2/10 back pain. Baseline: has to take rests Goal status: INITIAL  6.  Patient will demonstrate proper posture when bending and lifting to prevent back injury. Baseline:  Goal status: INITIAL   PLAN:  PT FREQUENCY: 2x/week  PT DURATION: 8 weeks  PLANNED INTERVENTIONS: 97164- PT Re-evaluation, 97110-Therapeutic exercises, 97530- Therapeutic activity, 97112- Neuromuscular re-education, 97535- Self Care, [id number]- Manual therapy,  [id number]- Gait training, [phone]- Canalith repositioning, [id number]- Aquatic Therapy, [id number]- Electrical stimulation (unattended), [phone]- Electrical stimulation (manual), [id number]- Vasopneumatic device, [id number]- Ultrasound, [id number]- Ionotophoresis 4mg /ml Dexamethasone , 79439 (1-2 muscles), [id number] (3+ muscles)- Dry Needling, Patient/Family education, Balance training, Stair training, Taping, Joint mobilization, Joint manipulation, Spinal manipulation, Spinal mobilization, Vestibular training, Cryotherapy, and Moist heat  PLAN FOR NEXT SESSION: LE strengthening; ankle strengthening & ROM; core strengthening :continue land and aquatics 6 min walk test next   Delon Darner, PTA 02/27/24 3:26 PM      [facility] [address] [place], [place] [address] Phone # [phone] Fax [phone]

## 2024-03-01 ENCOUNTER — Ambulatory Visit: Admitting: Physical Therapy

## 2024-03-01 ENCOUNTER — Encounter: Payer: Self-pay | Admitting: Physical Therapy

## 2024-03-01 DIAGNOSIS — R293 Abnormal posture: Secondary | ICD-10-CM

## 2024-03-01 DIAGNOSIS — R279 Unspecified lack of coordination: Secondary | ICD-10-CM

## 2024-03-01 DIAGNOSIS — G8929 Other chronic pain: Secondary | ICD-10-CM

## 2024-03-01 DIAGNOSIS — M6281 Muscle weakness (generalized): Secondary | ICD-10-CM

## 2024-03-01 DIAGNOSIS — M5459 Other low back pain: Secondary | ICD-10-CM

## 2024-03-01 DIAGNOSIS — M25561 Pain in right knee: Secondary | ICD-10-CM | POA: Diagnosis not present

## 2024-03-01 NOTE — Therapy (Signed)
 OUTPATIENT PHYSICAL THERAPY LOWER EXTREMITY TREATMENT   Patient Name: Chelsea Bell MRN: [id number] DOB:01/24/1956, 68 y.o., female Today's Date: 03/01/2024  END OF SESSION:  PT End of Session - 03/01/24 1317     Visit Number 19    Number of Visits 10    Date for PT Re-Evaluation 03/16/24    Authorization Type [employer] Medicare    Progress Note Due on Visit 20    PT Start Time 1102    PT Stop Time 1149    PT Time Calculation (min) 47 min    Activity Tolerance Patient tolerated treatment well    Behavior During Therapy WFL for tasks assessed/performed                   Past Medical History:  Diagnosis Date   Cancer (HCC)    endometrial cancer- surgery only   Concussion    GERD (gastroesophageal reflux disease)    Past Surgical History:  Procedure Laterality Date   ABDOMINAL HYSTERECTOMY     COLONOSCOPY WITH PROPOFOL  N/A 06/09/2014   Procedure: COLONOSCOPY WITH PROPOFOL ;  Surgeon: Lamar Donnald GAILS, MD;  Location: [facility];  Service: Endoscopy;  Laterality: N/A;   DILATION AND CURETTAGE OF UTERUS     LAPAROSCOPIC OVARIAN     diagnostic with 1 ovary removed , then Hysterectomy(endometrial cancer) with other ovary removed   TONSILLECTOMY     Patient Active Problem List   Diagnosis Date Noted   Overactive bladder 10/29/2023   SUI (stress urinary incontinence, female) 10/29/2023   Chest pain 10/07/2021    PCP: Marvine Rush, MD  REFERRING PROVIDER: Marvine Rush, MD  REFERRING DIAG: Rt 26.7 Gait training  THERAPY DIAG:  Chronic pain of both knees  Muscle weakness (generalized)  Other low back pain  Abnormal posture  Unspecified lack of coordination  Rationale for Evaluation and Treatment: Rehabilitation  ONSET DATE:  01/11/2024  SUBJECTIVE:   SUBJECTIVE STATEMENT: Yesterday she had trouble with her head. She got sweaty after getting out of the shower yesterday. While driving to church she felt increased pressure in her head so she turned  around and when back home. She has a little bit of head pressure today but it is tolerable.   Eval: Patient presents with pain in her feet and ankles. June 22 she had a bad fall. She had stool in her bathroom and she tripped over it and she hit her head on the door frame. She has multiple bruises on her head, knee, and ankle. Patient denies a brain bleed and any broken bones. She had a follow up with the orthopedic doctor and she has multiple appointments coming up. She has a concussion and does not get frequent headaches but she gets occasional twinges and sharp pains. She still get very fatigued and she wants to rest majority of the day. Her left knee is her bad knee. She does not use her straight cane in her house, but she used out in the community [employer], appointments etc). She sometimes uses the scooter when she grocery shopes. Her goal is to not use the straight cane. She feels she is too young to be using a straight cane.   PERTINENT HISTORY: Currently in pelvic therapy; hx cancer, B plantar fasciitis (chronic) PAIN:  Are you having pain?PAIN:  Are you having pain? Yes, low back NPRS scale:/[phone] Pain location:low back  PAIN TYPE: aching and dull Pain description: intermittent and aching  Aggravating factors: over doing it Relieving factors: seated foot  massage device, ice bottles, massage gun   PRECAUTIONS: Fall and Other: Concussion   RED FLAGS: None   WEIGHT BEARING RESTRICTIONS: No  FALLS:  Has patient fallen in last 6 months? Yes. Number of falls 1 fall 01/11/2024 see details above  LIVING ENVIRONMENT: Lives with: lives alone Lives in: House/apartment Stairs: Yes: Internal: 3.5 steps; on left going up Has following equipment at home: Single point cane, Shower bench, and Grab bars  OCCUPATION: Not currently working  PLOF: Independent, Independent with basic ADLs, Independent with household mobility without device, Independent with community mobility with  device, Independent with gait, Independent with transfers, and Leisure: teaches painting classes   PATIENT GOALS: To get rid of straight cane; To be able to walk up the steps normally; to be more mobile    NEXT MD VISIT: This week with PCP  OBJECTIVE:  Note: Objective measures were completed at Evaluation unless otherwise noted.  DIAGNOSTIC FINDINGS: Patient had imaging done on 01/11/2024 after fall. Head CT, CT C-spine, Knee, and shoulder imaging was normal no concerns.  PATIENT SURVEYS:  LEFS: 38/80 47.5   COGNITION: Overall cognitive status: Within functional limits for tasks assessed     SENSATION: WFL   POSTURE: rounded shoulders and forward head    LOWER EXTREMITY ROM: WFL bilateral    LOWER EXTREMITY MMT: Grossly 4/5 bilateral     FUNCTIONAL TESTS:  5 times sit to stand: 13.80 sec ; hands on knees; some pain in right hip Timed up and go (TUG): 12.48 sec no straight cane; 11.40 sec with straight cane  02/02/24 748 ft and RPE 6-7/10  - pressure starts but less than 2/10  03/01/2024 674ft ft no cane (antalgic gait) verbal cues for arm swing, ankle pain started at 2 mins RPE 5/10  GAIT: Assistive device utilized: Single point cane Comments: antalgic gait                                                                                                                                TREATMENT DATE:  03/01/24 NuStep: Level 3x 5 minutes-PT present to discuss progress 6MWT:637ft ft no cane (antalgic gait) verbal cues for arm swing, ankle pain started at 2 mins  RPE 5/10 Sitting heel raise with small purple/pink ball in between 2 x 10 Side stepping with yellow loop around ankles 3 x 15 ft (seated rest break in between sets) Patient had increased pressure in her head after second lap Step ups at stair (bilateral UE support) x 10 bilateral (some discomfort when stepping up with left) Hurdles (forwards) x 4 laps step to pattern then last set step over pattern  (unilateral UE support at counter)   02/27/24:Pt arrives for aquatic physical therapy. Treatment took place in 3.5-5.5 feet of water. Water temperature was 91 degrees F. Pt entered the pool via stairs slowly and with heavy use of the hand rail. Pt ambulates to pool deck independently.Pt requires buoyancy of water  for support and to offload joints with strengthening exercises.  Seated water bench with 75% submersion Pt performed seated LE AROM exercises 20x in all planes, with concurrent discussion on status. 75% depth water walking with teal noodle for light back support 8x in each direction. Gentle hip 3 ways kicks 20x Bil. High knee marching across pool 6x holding yellow noodle for balance. Core lat press with 1 hand float 2x10. Attempted some low back stretching but never could get a good stretch, pt felt her calf tightness was most prominent.   02/24/24 Discussion of dizziness and knee pain NuStep: Level 2x 7 minutes-PT present to discuss progress Step ups at stair (using left railing and str cane for support. This is how patient performs it at home she only has one railing) x 5 bilateral   Step ups at stair using bilateral UE support x 8 bilateral  Seated hamstring curls with yellow loop 2x10 Patient education on knee replacements and when to know one might be needed. Seated LAQ x 10 bil 3# added  Seated hamstring and figure 4 stretch 2x20 seconds bil     02/20/24:Pt arrives for aquatic physical therapy. Treatment took place in 3.5-5.5 feet of water. Water temperature was 91 degrees F. Pt entered the pool via stairs slowly and with heavy use of the hand rail. Pt ambulates to pool deck independently.Pt requires buoyancy of water for support and to offload joints with strengthening exercises.  Seated water bench with 75% submersion Pt performed seated LE AROM exercises 20x in all planes, with concurrent discussion on status. 75% depth water walking with natural arms 8x in each direction. Gentle  hip 3 ways kicks 20x Bil. High knee marching across pool 6x holding yellow noodle for balance. Core lat press with 1 hand float 2x5.   PATIENT EDUCATION:  Education details: PT eval findings, anticipated POC, progress with PT, and initial HEP Person educated: Patient Education method: Explanation, Demonstration, and Handouts Education comprehension: verbalized understanding, returned demonstration, and needs further education  HOME EXERCISE PROGRAM: Access Code: [email or web address] URL: [email or web address] Date: 03/01/2024 Prepared by: Kristeen Sar  Exercises - Seated Long Arc Quad  - 2 x daily - 7 x weekly - 2 sets - 10 reps - 2 hold - Long Sitting Ankle Pumps  - 1 x daily - 7 x weekly - 2 sets - 10 reps - Seated Heel Raise  - 2 x daily - 7 x weekly - 2 sets - 10 reps - Seated Ankle Alphabet  - 2 x daily - 7 x weekly - 1 sets - 1 reps - Seated March  - 2 x daily - 7 x weekly - 2 sets - 10 reps - Seated Ankle Dorsiflexion AROM  - 2 x daily - 7 x weekly - 2 sets - 10 reps - Forward Step Up with Counter Support  - 1 x daily - 7 x weekly - 1 sets - 5 reps - Seated Hamstring Stretch  - 2 x daily - 7 x weekly - 1 sets - 3 reps - 20 hold - Seated Figure 4 Piriformis Stretch  - 2 x daily - 7 x weekly - 1 sets - 3 reps - 20 hold - Seated Hip External Rotation AROM  - 2 x daily - 7 x weekly - 1-2 sets - 10 reps - Side Stepping with Resistance at Feet  - 1 x daily - 7 x weekly - 3 sets - 5 reps   Pt Education:  [email or web address]  ASSESSMENT:  CLINICAL IMPRESSION:  Nathanel verbalized having increased head pressure yesterday while she was driving to church. She turned around and went back home. During treatment session she verbalized a light increase in head pressure, but after a seated rest break she was able to continue standing exercises. Re-administered without using patient's straight cane and she  walked 39ft less than she walked without straight cane. Discussed gait mechanics with patient. She ambulates with decreased stance on left, no arm swing, and antalgic gait. Educated patient on how we can address these impairments with therapy. Patient will benefit from skilled PT to address the below impairments and improve overall function.   Follow up with PCP aug 28 Eval: Patient is a 68 y.o. female who was seen today for physical therapy evaluation and treatment for gait training. Nathanel presents with chronic bilateral foot and ankle that was re aggravated January 11, 2024 after a fall. She fell in her bathroom and she hit her head on the door threshold. She had imaging down and everything came back unremarkable. She does have a concussion and she is limited in her able to bend and pick up items and she gets occasional sharp pains in her head. She denies frequent headaches. She goal is to initial a gym routine and stay mobile. Patient would benefit from some sessions of aquatic therapy to establish an aquatic program to perform on her own. Patient is motivated and wants to get better. Patient will benefit from skilled PT to address the below impairments and improve overall function.    OBJECTIVE IMPAIRMENTS: Abnormal gait, decreased balance, decreased endurance, difficulty walking, decreased ROM, decreased strength, increased muscle spasms, prosthetic dependency , and pain.   ACTIVITY LIMITATIONS: carrying, lifting, bending, standing, squatting, stairs, transfers, and locomotion level  PARTICIPATION LIMITATIONS: meal prep, cleaning, laundry, interpersonal relationship, shopping, community activity, and church  PERSONAL FACTORS: Age, Fitness, and Time since onset of injury/illness/exacerbation are also affecting patient's functional outcome.   REHAB POTENTIAL: Good  CLINICAL DECISION MAKING: Evolving/moderate complexity  EVALUATION COMPLEXITY: Moderate   GOALS: Goals reviewed with patient?  Yes  SHORT TERM GOALS: Target date: 02/17/2024  Patient will be independent with initial HEP. Baseline:  Goal status: MET  2.  Patient will be able to participant a to establish a baseline for functional test. Baseline: will test next visit due to foot pain today (02/16/24) Goal status: INITIAL   3.  Patient will perform 5STS in < or = to 12 sec for improved functional mobility. Baseline:  Goal status: INITIAL    LONG TERM GOALS: Target date: 03/16/2024  Patient will demonstrate independence in advanced HEP. Baseline:  Goal status: INITIAL  2.  Patient will report > or = to 50% improvement in ankle and foot pain since starting PT. Baseline:  Goal status: INITIAL  3.  Patient will verbalize and demonstrate self-care strategies to manage pain including tissue mobility practices and change of position. Baseline:  Goal status: INITIAL  4.  Patient will score > or = to 48/80 on LEFS due to improved function. Baseline: 38/80 Goal status: INITIAL  5.  Patient will be able to stand long enough to wash dishes with < or = to 2/10 back pain. Baseline: has to take rests Goal status: INITIAL  6.  Patient will demonstrate proper posture when bending and lifting to prevent back injury. Baseline:  Goal status: INITIAL   PLAN:  PT FREQUENCY: 2x/week  PT DURATION: 8 weeks  PLANNED INTERVENTIONS: 97164- PT Re-evaluation, 97110-Therapeutic exercises, 97530-  Therapeutic activity, [id number]- Neuromuscular re-education, [phone]- Self Care, 02859- Manual therapy, [phone]- Gait training, [phone]- Canalith repositioning, [id number]- Aquatic Therapy, [id number]- Electrical stimulation (unattended), [phone]- Electrical stimulation (manual), [id number]- Vasopneumatic device, [id number]- Ultrasound, [id number]- Ionotophoresis 4mg /ml Dexamethasone , 20560 (1-2 muscles), 20561 (3+ muscles)- Dry Needling, Patient/Family education, Balance training, Stair training, Taping, Joint mobilization, Joint manipulation, Spinal manipulation,  Spinal mobilization, Vestibular training, Cryotherapy, and Moist heat  PLAN FOR NEXT SESSION: 20th visit PN; standing/ walking tolerance; balance; LE strengthening; ankle strengthening & ROM; core strengthening      Kristeen Sar, PT 03/01/24 1:19 PM [facility] [address] [place], [place] [address] Phone # [phone] Fax [phone]

## 2024-03-03 ENCOUNTER — Ambulatory Visit: Admitting: Physical Therapy

## 2024-03-04 ENCOUNTER — Ambulatory Visit: Attending: Obstetrics and Gynecology | Admitting: Physical Therapy

## 2024-03-04 ENCOUNTER — Encounter: Admitting: Physical Therapy

## 2024-03-04 DIAGNOSIS — M25561 Pain in right knee: Secondary | ICD-10-CM | POA: Insufficient documentation

## 2024-03-04 DIAGNOSIS — M25562 Pain in left knee: Secondary | ICD-10-CM | POA: Insufficient documentation

## 2024-03-04 DIAGNOSIS — G8929 Other chronic pain: Secondary | ICD-10-CM | POA: Diagnosis present

## 2024-03-04 DIAGNOSIS — M5459 Other low back pain: Secondary | ICD-10-CM | POA: Insufficient documentation

## 2024-03-04 DIAGNOSIS — M6281 Muscle weakness (generalized): Secondary | ICD-10-CM | POA: Insufficient documentation

## 2024-03-04 NOTE — Patient Instructions (Signed)

## 2024-03-04 NOTE — Therapy (Signed)
 OUTPATIENT PHYSICAL THERAPY FEMALE PELVIC TREATMENT   Patient Name: Chelsea Bell MRN: [id number] DOB:14-May-1956, 68 y.o., female Today's Date: 03/04/2024  END OF SESSION:  PT End of Session - 03/04/24 1042     Visit Number 20    Number of Visits 10    Date for PT Re-Evaluation 03/16/24    Authorization Type [employer] Medicare    Progress Note Due on Visit 20    PT Start Time 1015    PT Stop Time 1050    PT Time Calculation (min) 35 min    Activity Tolerance Patient tolerated treatment well    Behavior During Therapy WFL for tasks assessed/performed         Past Medical History:  Diagnosis Date   Cancer (HCC)    endometrial cancer- surgery only   Concussion    GERD (gastroesophageal reflux disease)    Past Surgical History:  Procedure Laterality Date   ABDOMINAL HYSTERECTOMY     COLONOSCOPY WITH PROPOFOL  N/A 06/09/2014   Procedure: COLONOSCOPY WITH PROPOFOL ;  Surgeon: Lamar Donnald GAILS, MD;  Location: [facility];  Service: Endoscopy;  Laterality: N/A;   DILATION AND CURETTAGE OF UTERUS     LAPAROSCOPIC OVARIAN     diagnostic with 1 ovary removed , then Hysterectomy(endometrial cancer) with other ovary removed   TONSILLECTOMY     Patient Active Problem List   Diagnosis Date Noted   Overactive bladder 10/29/2023   SUI (stress urinary incontinence, female) 10/29/2023   Chest pain 10/07/2021   PCP: Marvine Rush, MD  REFERRING PROVIDER: [name], MD   REFERRING DIAG: N32.81 (ICD-10-CM) - Overactive bladder N39.3 (ICD-10-CM) - SUI (stress urinary incontinence, female)  THERAPY DIAG:  Chronic pain of both knees  Muscle weakness (generalized)  Other low back pain  Rationale for Evaluation and Treatment: Rehabilitation  ONSET DATE: unknown   SUBJECTIVE:                                                                                                                                                                                            SUBJECTIVE STATEMENT: kathy Patient reports that she is doing well today. She tried to go to choir practice last night and she felt a little lightheaded while singing. Her body has been feeling good. She has continued to see her medical masseuse. She reports that she is wearing smaller pads during the day when she goes out. She is usually not leaking during the daytime, but sometimes it will randomly happen still. She doesn't leak much during the night, only when she gets up from her sleep.   Eval: Patient reports to  pelvic PT with urinary incontinence that has been getting progressively worse for many years. Some days she wears 2 pads, some days she has full bladder loss and requires more frequent changes. The pads she uses usually only catch 4-5 drops of urine when she does leak. No bowel related concerns to report. No pelvic pain to report.  Fluid intake: is trying to increase her water intake, she is trying to cut back on soda/caffeine   PAIN:  Are you having pain? No NPRS scale: 0/10  PRECAUTIONS: None  RED FLAGS: None   WEIGHT BEARING RESTRICTIONS: No  FALLS:  Has patient fallen in last 6 months? 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, is a Facilities manager   ACTIVITY LEVEL : not currently exercising consistently   PLOF: Independent  PATIENT GOALS: to decrease urinary leakage and control bladder better   PERTINENT HISTORY:  Has plantar fasciitis and she uses a cane to help her ambulate  Hx: GERD, endometrial cancer (surgery), abdominal hysterectomy, ovarian removal  Sexual abuse: No  BOWEL MOVEMENT: Pain with bowel movement: No Type of bowel movement:Type (Bristol Stool Scale) 4, Frequency 1x/day, Strain no, and Splinting no Fully empty rectum: Yes:   Leakage: No Pads: No Fiber supplement/laxative No  URINATION: Pain with urination: No Fully empty bladder: Noshe feels like she empties, but there is some residual voiding volume  Stream:  Weak Urgency: Yes  Frequency: feels like she goes all the time - goes 1-3x/night  Leakage: Urge to void, Walking to the bathroom, Coughing, Sneezing, and Laughing Pads: Yes: long pads 4 or 5 drops   INTERCOURSE: not currently sexually active   PREGNANCY: Vaginal deliveries 0 C-section deliveries 0  PROLAPSE: None  OBJECTIVE:  Note: Objective measures were completed at Evaluation unless otherwise noted.  PATIENT SURVEYS: PFIQ-7: 14  COGNITION: Overall cognitive status: Within functional limits for tasks assessed     SENSATION: Light touch: Appears intact  LUMBAR SPECIAL TESTS:  Single leg stance test: Positive  FUNCTIONAL TESTS:  Squat: mild dynamic knee valgus bilaterally with sit to stand transfer   GAIT: Assistive device utilized: Single point cane Comments: moderate trendelenburg gait pattern with ambulation   POSTURE: rounded shoulders, forward head, increased thoracic kyphosis, and flexed trunk    LUMBARAROM/PROM: within functional limits   A/PROM A/PROM  eval  Flexion   Extension   Right lateral flexion   Left lateral flexion   Right rotation   Left rotation    (Blank rows = not tested)  LOWER EXTREMITY ROM: within functional limits   Active ROM Right eval Left eval  Hip flexion    Hip extension    Hip abduction    Hip adduction    Hip internal rotation    Hip external rotation    Knee flexion    Knee extension    Ankle dorsiflexion    Ankle plantarflexion    Ankle inversion    Ankle eversion     (Blank rows = not tested)  LOWER EXTREMITY MMT: 4-/5 bilateral knees and hips grossly   MMT Right eval Left eval  Hip flexion    Hip extension    Hip abduction    Hip adduction    Hip internal rotation    Hip external rotation    Knee flexion    Knee extension    Ankle dorsiflexion    Ankle plantarflexion    Ankle inversion    Ankle eversion     (Blank rows = not  tested) PALPATION:   General: no significant tenderness to  palpation at bilateral adductors or hip flexors   Pelvic Alignment: within normal limits   Abdominal: decreased rib excursion with inhalation, abdominal bracing at rest, upper chest breathing at rest                External Perineal Exam: mild dryness noted with sufficient clitoral hood mobility present                               Internal Pelvic Floor: Patient fully consents to today's internal examination. She demonstrates a stiffness throughout the superficial and deep layers of the pelvic floor bilaterally, but no tenderness to palpation of superficial or deep musculature throughout. Patient demonstrates a lack of coordination in the pelvic floor with inhalation/exhalation. Following internal cueing, patient was able to actively lengthen and shorten the pelvic floor with her breathing techniques. Repetitive pelvic floor contractions were challenging for the patient.   Patient confirms identification and approves PT to assess internal pelvic floor and treatment Yes No emotional/communication barriers or cognitive limitation. Patient is motivated to learn. Patient understands and agrees with treatment goals and plan. PT explains patient will be examined in standing, sitting, and lying down to see how their muscles and joints work. When they are ready, they will be asked to remove their underwear so PT can examine their perineum. The patient is also given the option of providing their own chaperone as one is not provided in our facility. The patient also has the right and is explained the right to defer or refuse any part of the evaluation or treatment including the internal exam. With the patient's consent, PT will use one gloved finger to gently assess the muscles of the pelvic floor, seeing how well it contracts and relaxes and if there is muscle symmetry. After, the patient will get dressed and PT and patient will discuss exam findings and plan of care. PT and patient discuss plan of care,  schedule, attendance policy and HEP activities.  PELVIC MMT:   MMT eval  Vaginal 3/5 manual muscle test score, 5 quick flicks that were challenging, 0 second hold  Internal Anal Sphincter   External Anal Sphincter   Puborectalis   Diastasis Recti   (Blank rows = not tested)        TONE: Within normal limits   PROLAPSE: No significant anterior or posterior vaginal wall laxity present   TODAY'S TREATMENT:                                                                                                                              DATE:   01/14/24: Neuro re-ed: standing diaphragmatic breathing + pelvic floor lengthening + shortening with inhalation/exhalation 2x10  seated diaphragmatic breathing + pelvic floor lengthening + shortening with inhalation/exhalation 2x10  Self care: Relative pelvic floor anatomy and the connection between the diaphragm and  pelvic floor, intraabdominal pressure management and how this affects our pelvic floor musculature Knack technique for stress urinary incontinence  Toileting mechanics and techniques to fully empty rectum when passing bowel movements  In the meantime while you recover from your concussion: Hold off on all pelvic floor exercises other than these:  Standing/seated pelvic floor contractions: Range of motion training: 2x10  Sit or stand with your hands on your stomach. Inhale as your tummy fills with air like a balloon filling up, and your pelvic floor relaxes. Exhale as you perform a kegel while you blow out.  Quick flicks: 2x10  Sit or stand and perform 10 quick kegels in a row, making sure you rest between each contraction  Rest as much as needed, avoid high intensity movement or exercise, limit screen time if you can, stay extra hydrated during this time   01/20/24: Neuro re-ed: standing diaphragmatic breathing + pelvic floor lengthening + shortening with inhalation/exhalation 2x10  Sit to stand with kegel at top + diaphragmatic  breathing 2x8  Standing alternating march + kegel at top of march + diaphragmatic breathing 2x10 each  Sidelying clam (RTB) + reverse clamshell + diaphragmatic breathing 2x8  Therapeutic exercise:  Standing hip abduction with RTB around ankles + diaphragmatic breathing 2x10  Standing alternating march + kegel at top of march + diaphragmatic breathing 2x10 each  Self care: Relative pelvic floor anatomy and the connection between the diaphragm and pelvic floor, intraabdominal pressure management and how this affects our pelvic floor musculature Knack technique for stress urinary incontinence  Toileting mechanics and techniques to fully empty rectum when passing bowel movements   03/04/24: Self care/neuro re-ed: Relative pelvic floor anatomy and the connection between the diaphragm and pelvic floor, intraabdominal pressure management and how this affects our pelvic floor musculature Knack technique for stress urinary incontinence  Toileting mechanics and techniques to fully empty rectum when passing bowel movements  Urge drill for urge urinary incontinence  standing diaphragmatic breathing + pelvic floor lengthening + shortening with inhalation/exhalation 2x10  Sit to stand with kegel at top + diaphragmatic breathing 2x8  Standing alternating march + kegel at top of march + diaphragmatic breathing 2x10 each  Sidelying clam (RTB) + reverse clamshell + diaphragmatic breathing 2x8   PATIENT EDUCATION:  Education details: Relative pelvic floor anatomy and the connection between the diaphragm and pelvic floor, intraabdominal pressure management and how this affects our pelvic floor musculature Person educated: Patient Education method: Explanation, Demonstration, Tactile cues, Verbal cues, and Handouts Education comprehension: verbalized understanding, returned demonstration, verbal cues required, tactile cues required, and needs further education  HOME EXERCISE PROGRAM: Access Code:  [email or web address] URL: [email or web address] Date: 01/20/2024 Prepared by: Celena Domino  Exercises - Standing Pelvic Floor Contraction  - 1 x daily - 7 x weekly - 2 sets - 10 reps - Standing Marching  - 1 x daily - 7 x weekly - 2 sets - 10 reps - Sit to Stand Without Arm Support  - 1 x daily - 7 x weekly - 2 sets - 8-10 reps - Clam with Resistance  - 1 x daily - 7 x weekly - 2 sets - 10 reps - Sidelying Reverse Clamshell  - 1 x daily - 7 x weekly - 2 sets - 10 reps - Supine Bridge with Resistance Band  - 1 x daily - 7 x weekly - 2 sets - 10 reps - Seated Hip Abduction  - 1 x daily - 7 x weekly - 2 sets - 10  reps - Standing Hip Abduction with Resistance at Ankles and Counter Support  - 1 x daily - 7 x weekly - 2 sets - 10 reps  ASSESSMENT:  CLINICAL IMPRESSION: Patient is a 68 y.o. female  who was seen today for physical therapy treatment for stress incontinence. Her pelvic floor symptoms have continuously been improving over time. She will still leak with a high sense of urgency when getting out of bed to void. Urge drill introduced and practiced today and patient plans to work on this until next session (discharge) to resolve any remaining incontinence issues. No pain or leakage following today's session and Pt would benefit from additional PT to further address deficits.    OBJECTIVE IMPAIRMENTS: decreased coordination, decreased endurance, decreased mobility, decreased ROM, and decreased strength.   ACTIVITY LIMITATIONS: continence  PARTICIPATION LIMITATIONS: N/A  PERSONAL FACTORS: Age, Past/current experiences, and Time since onset of injury/illness/exacerbation are also affecting patient's functional outcome.   REHAB POTENTIAL: Good  CLINICAL DECISION MAKING: Stable/uncomplicated  EVALUATION COMPLEXITY: Low   GOALS: Goals reviewed with patient? Yes  SHORT TERM GOALS: Target date: 11/28/2023  Pt will be independent with HEP.  Baseline: Goal status: GOAL MET  01/20/24  2.  Pt will be independent with the knack, urge suppression technique, and double voiding in order to improve bladder habits and decrease urinary incontinence.   Baseline:  Goal status: GOAL MET 01/20/24  3.  Pt will report no leaks with laughing, coughing, sneezing in order to improve comfort with interpersonal relationships and community activities so that she does not have to change pads multiple times per day. Baseline:  Goal status: GOAL MET 01/20/24  LONG TERM GOALS: Target date: 05/01/2024  Pt will be independent with advanced HEP.  Baseline:  Goal status: ONGOING 01/20/24  2.  Pt to demonstrate improved coordination of pelvic floor and breathing mechanics with 10# squat with appropriate synergistic patterns to decrease pain and leakage at least 75% of the time.   Baseline:  Goal status: ONGOING 01/20/24  3.  Pt will be able to lift at least 10 lb correctly for 10 reps without pain or leakage for functional activities to allow patient to pick up weighted objects in public without leaking urine.  Baseline:  Goal status: ONGOING 01/20/24  4.  Pt will demonstrate increase in all impaired hip strength by 1 muscle grades in order to demonstrate improved lumbopelvic support and increase functional ability.   Baseline:  Goal status: ONGOING 01/20/24  PLAN:  PT FREQUENCY: 2 more visits before end of August   PT DURATION: 2 months   PLANNED INTERVENTIONS: 97110-Therapeutic exercises, 97530- Therapeutic activity, 97112- Neuromuscular re-education, 97535- Self Care, [id number]- Manual therapy, Taping, Dry Needling, Joint mobilization, Spinal mobilization, Scar mobilization, Cryotherapy, and Moist heat  PLAN FOR NEXT SESSION: continued pelvic floor AROM training in seated, introduce hip and core strengthening, introduce downtraining stretches   Celena JAYSON Domino, PT 03/04/2024, 10:43 AM

## 2024-03-05 ENCOUNTER — Encounter: Payer: Self-pay | Admitting: Physical Therapy

## 2024-03-05 ENCOUNTER — Ambulatory Visit: Admitting: Physical Therapy

## 2024-03-05 DIAGNOSIS — M5459 Other low back pain: Secondary | ICD-10-CM

## 2024-03-05 DIAGNOSIS — R279 Unspecified lack of coordination: Secondary | ICD-10-CM

## 2024-03-05 DIAGNOSIS — G8929 Other chronic pain: Secondary | ICD-10-CM

## 2024-03-05 DIAGNOSIS — M25561 Pain in right knee: Secondary | ICD-10-CM | POA: Diagnosis not present

## 2024-03-05 DIAGNOSIS — R293 Abnormal posture: Secondary | ICD-10-CM

## 2024-03-05 DIAGNOSIS — M6281 Muscle weakness (generalized): Secondary | ICD-10-CM

## 2024-03-05 NOTE — Therapy (Signed)
 OUTPATIENT PHYSICAL THERAPY FEMALE PELVIC TREATMENT   Patient Name: Chelsea Bell MRN: [id number] DOB:07-30-1955, 68 y.o., female Today's Date: 03/05/2024  END OF SESSION:  PT End of Session - 03/05/24 1211     Visit Number 21    Date for PT Re-Evaluation 03/16/24    Authorization Type [employer] Medicare    Progress Note Due on Visit 30    PT Start Time 1209    PT Stop Time 1300    PT Time Calculation (min) 51 min    Activity Tolerance Patient tolerated treatment well    Behavior During Therapy WFL for tasks assessed/performed          Past Medical History:  Diagnosis Date   Cancer (HCC)    endometrial cancer- surgery only   Concussion    GERD (gastroesophageal reflux disease)    Past Surgical History:  Procedure Laterality Date   ABDOMINAL HYSTERECTOMY     COLONOSCOPY WITH PROPOFOL  N/A 06/09/2014   Procedure: COLONOSCOPY WITH PROPOFOL ;  Surgeon: Lamar Donnald GAILS, MD;  Location: [facility];  Service: Endoscopy;  Laterality: N/A;   DILATION AND CURETTAGE OF UTERUS     LAPAROSCOPIC OVARIAN     diagnostic with 1 ovary removed , then Hysterectomy(endometrial cancer) with other ovary removed   TONSILLECTOMY     Patient Active Problem List   Diagnosis Date Noted   Overactive bladder 10/29/2023   SUI (stress urinary incontinence, female) 10/29/2023   Chest pain 10/07/2021   PCP: Marvine Rush, MD  REFERRING PROVIDER: [name], MD   REFERRING DIAG: N32.81 (ICD-10-CM) - Overactive bladder N39.3 (ICD-10-CM) - SUI (stress urinary incontinence, female)  THERAPY DIAG:  Chronic pain of both knees  Muscle weakness (generalized)  Other low back pain  Abnormal posture  Unspecified lack of coordination  Rationale for Evaluation and Treatment: Rehabilitation  ONSET DATE: unknown   SUBJECTIVE:                                                                                                                                                                                            SUBJECTIVE STATEMENT: I am doing good today. My head has felt good last few days.My back did much better controlling my motion last time in the pool.   Eval: Patient reports to pelvic PT with urinary incontinence that has been getting progressively worse for many years. Some days she wears 2 pads, some days she has full bladder loss and requires more frequent changes. The pads she uses usually only catch 4-5 drops of urine when she does leak. No bowel related concerns to report. No pelvic pain to report.  Fluid intake: is trying to increase her water intake, she is trying to cut back on soda/caffeine.    PAIN:  Are you having pain? No NPRS scale: 0/10  PRECAUTIONS: None  RED FLAGS: None   WEIGHT BEARING RESTRICTIONS: No  FALLS:  Has patient fallen in last 6 months? 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, is a Facilities manager   ACTIVITY LEVEL : not currently exercising consistently   PLOF: Independent  PATIENT GOALS: to decrease urinary leakage and control bladder better   PERTINENT HISTORY:  Has plantar fasciitis and she uses a cane to help her ambulate  Hx: GERD, endometrial cancer (surgery), abdominal hysterectomy, ovarian removal  Sexual abuse: No  BOWEL MOVEMENT: Pain with bowel movement: No Type of bowel movement:Type (Bristol Stool Scale) 4, Frequency 1x/day, Strain no, and Splinting no Fully empty rectum: Yes:   Leakage: No Pads: No Fiber supplement/laxative No  URINATION: Pain with urination: No Fully empty bladder: Noshe feels like she empties, but there is some residual voiding volume  Stream: Weak Urgency: Yes  Frequency: feels like she goes all the time - goes 1-3x/night  Leakage: Urge to void, Walking to the bathroom, Coughing, Sneezing, and Laughing Pads: Yes: long pads 4 or 5 drops   INTERCOURSE: not currently sexually active   PREGNANCY: Vaginal deliveries 0 C-section deliveries  0  PROLAPSE: None  OBJECTIVE:  Note: Objective measures were completed at Evaluation unless otherwise noted.  PATIENT SURVEYS: PFIQ-7: 14  COGNITION: Overall cognitive status: Within functional limits for tasks assessed     SENSATION: Light touch: Appears intact  LUMBAR SPECIAL TESTS:  Single leg stance test: Positive  FUNCTIONAL TESTS:  Squat: mild dynamic knee valgus bilaterally with sit to stand transfer   GAIT: Assistive device utilized: Single point cane Comments: moderate trendelenburg gait pattern with ambulation   POSTURE: rounded shoulders, forward head, increased thoracic kyphosis, and flexed trunk    LUMBARAROM/PROM: within functional limits   A/PROM A/PROM  eval  Flexion   Extension   Right lateral flexion   Left lateral flexion   Right rotation   Left rotation    (Blank rows = not tested)  LOWER EXTREMITY ROM: within functional limits   Active ROM Right eval Left eval  Hip flexion    Hip extension    Hip abduction    Hip adduction    Hip internal rotation    Hip external rotation    Knee flexion    Knee extension    Ankle dorsiflexion    Ankle plantarflexion    Ankle inversion    Ankle eversion     (Blank rows = not tested)  LOWER EXTREMITY MMT: 4-/5 bilateral knees and hips grossly   MMT Right eval Left eval  Hip flexion    Hip extension    Hip abduction    Hip adduction    Hip internal rotation    Hip external rotation    Knee flexion    Knee extension    Ankle dorsiflexion    Ankle plantarflexion    Ankle inversion    Ankle eversion     (Blank rows = not tested) PALPATION:   General: no significant tenderness to palpation at bilateral adductors or hip flexors   Pelvic Alignment: within normal limits   Abdominal: decreased rib excursion with inhalation, abdominal bracing at rest, upper chest breathing at rest                External Perineal Exam:  mild dryness noted with sufficient clitoral hood mobility present                                Internal Pelvic Floor: Patient fully consents to today's internal examination. She demonstrates a stiffness throughout the superficial and deep layers of the pelvic floor bilaterally, but no tenderness to palpation of superficial or deep musculature throughout. Patient demonstrates a lack of coordination in the pelvic floor with inhalation/exhalation. Following internal cueing, patient was able to actively lengthen and shorten the pelvic floor with her breathing techniques. Repetitive pelvic floor contractions were challenging for the patient.   Patient confirms identification and approves PT to assess internal pelvic floor and treatment Yes No emotional/communication barriers or cognitive limitation. Patient is motivated to learn. Patient understands and agrees with treatment goals and plan. PT explains patient will be examined in standing, sitting, and lying down to see how their muscles and joints work. When they are ready, they will be asked to remove their underwear so PT can examine their perineum. The patient is also given the option of providing their own chaperone as one is not provided in our facility. The patient also has the right and is explained the right to defer or refuse any part of the evaluation or treatment including the internal exam. With the patient's consent, PT will use one gloved finger to gently assess the muscles of the pelvic floor, seeing how well it contracts and relaxes and if there is muscle symmetry. After, the patient will get dressed and PT and patient will discuss exam findings and plan of care. PT and patient discuss plan of care, schedule, attendance policy and HEP activities.  PELVIC MMT:   MMT eval  Vaginal 3/5 manual muscle test score, 5 quick flicks that were challenging, 0 second hold  Internal Anal Sphincter   External Anal Sphincter   Puborectalis   Diastasis Recti   (Blank rows = not tested)        TONE: Within normal limits    PROLAPSE: No significant anterior or posterior vaginal wall laxity present   TODAY'S TREATMENT:                                                                                                                              DATE:    03/05/24:Pt arrives for aquatic physical therapy. Treatment took place in 3.5-5.5 feet of water. Water temperature was 91 degrees F. Pt entered the pool via stairs slowly and with heavy use of the hand rail. Pt ambulates to pool deck independently.Pt requires buoyancy of water for support and to offload joints with strengthening exercises.  Seated water bench with 75% submersion Pt performed seated LE AROM exercises 20x in all planes, with concurrent discussion on status. 75% depth water walking with UE push/pull with rainbow floats 10x in each direction. Gentle hip 3 ways kicks 20x Bil. High knee  marching across pool 6x holding teal noodle for balance. Core lat press with 2 hand floats 2x15. 1 step step ups 10x each leg holding both rails, then 5x taking 2 reciprocal LE steps. Pt ended with lumbar stretch in standing.     01/14/24: Neuro re-ed: standing diaphragmatic breathing + pelvic floor lengthening + shortening with inhalation/exhalation 2x10  seated diaphragmatic breathing + pelvic floor lengthening + shortening with inhalation/exhalation 2x10  Self care: Relative pelvic floor anatomy and the connection between the diaphragm and pelvic floor, intraabdominal pressure management and how this affects our pelvic floor musculature Knack technique for stress urinary incontinence  Toileting mechanics and techniques to fully empty rectum when passing bowel movements  In the meantime while you recover from your concussion: Hold off on all pelvic floor exercises other than these:  Standing/seated pelvic floor contractions: Range of motion training: 2x10  Sit or stand with your hands on your stomach. Inhale as your tummy fills with air like a balloon filling up, and  your pelvic floor relaxes. Exhale as you perform a kegel while you blow out.  Quick flicks: 2x10  Sit or stand and perform 10 quick kegels in a row, making sure you rest between each contraction  Rest as much as needed, avoid high intensity movement or exercise, limit screen time if you can, stay extra hydrated during this time   01/20/24: Neuro re-ed: standing diaphragmatic breathing + pelvic floor lengthening + shortening with inhalation/exhalation 2x10  Sit to stand with kegel at top + diaphragmatic breathing 2x8  Standing alternating march + kegel at top of march + diaphragmatic breathing 2x10 each  Sidelying clam (RTB) + reverse clamshell + diaphragmatic breathing 2x8  Therapeutic exercise:  Standing hip abduction with RTB around ankles + diaphragmatic breathing 2x10  Standing alternating march + kegel at top of march + diaphragmatic breathing 2x10 each  Self care: Relative pelvic floor anatomy and the connection between the diaphragm and pelvic floor, intraabdominal pressure management and how this affects our pelvic floor musculature Knack technique for stress urinary incontinence  Toileting mechanics and techniques to fully empty rectum when passing bowel movements    PATIENT EDUCATION:  Education details: Relative pelvic floor anatomy and the connection between the diaphragm and pelvic floor, intraabdominal pressure management and how this affects our pelvic floor musculature Person educated: Patient Education method: Explanation, Demonstration, Tactile cues, Verbal cues, and Handouts Education comprehension: verbalized understanding, returned demonstration, verbal cues required, tactile cues required, and needs further education  HOME EXERCISE PROGRAM: Access Code: [email or web address] URL: [email or web address] Date: 01/20/2024 Prepared by: Celena Domino  Exercises - Standing Pelvic Floor Contraction  - 1 x daily - 7 x weekly - 2 sets - 10 reps - Standing Marching  - 1 x  daily - 7 x weekly - 2 sets - 10 reps - Sit to Stand Without Arm Support  - 1 x daily - 7 x weekly - 2 sets - 8-10 reps - Clam with Resistance  - 1 x daily - 7 x weekly - 2 sets - 10 reps - Sidelying Reverse Clamshell  - 1 x daily - 7 x weekly - 2 sets - 10 reps - Supine Bridge with Resistance Band  - 1 x daily - 7 x weekly - 2 sets - 10 reps - Seated Hip Abduction  - 1 x daily - 7 x weekly - 2 sets - 10 reps - Standing Hip Abduction with Resistance at Ankles and Counter Support  - 1 x daily - 7  x weekly - 2 sets - 10 reps  ASSESSMENT:  CLINICAL IMPRESSION: Pt presents today with no HA or pain. She reports paying closer attention to her hip ROM was better for her back. Pt could resume UE movements and less lumbar support when walking today. We also decreased support with high knee marching. Pt demonstrating improved balance and core stabilization.   OBJECTIVE IMPAIRMENTS: decreased coordination, decreased endurance, decreased mobility, decreased ROM, and decreased strength.   ACTIVITY LIMITATIONS: continence  PARTICIPATION LIMITATIONS: N/A  PERSONAL FACTORS: Age, Past/current experiences, and Time since onset of injury/illness/exacerbation are also affecting patient's functional outcome.   REHAB POTENTIAL: Good  CLINICAL DECISION MAKING: Stable/uncomplicated  EVALUATION COMPLEXITY: Low   GOALS: Goals reviewed with patient? Yes  SHORT TERM GOALS: Target date: 11/28/2023  Pt will be independent with HEP.  Baseline: Goal status: GOAL MET 01/20/24  2.  Pt will be independent with the knack, urge suppression technique, and double voiding in order to improve bladder habits and decrease urinary incontinence.   Baseline:  Goal status: GOAL MET 01/20/24  3.  Pt will report no leaks with laughing, coughing, sneezing in order to improve comfort with interpersonal relationships and community activities so that she does not have to change pads multiple times per day. Baseline:  Goal status:  GOAL MET 01/20/24  LONG TERM GOALS: Target date: 05/01/2024  Pt will be independent with advanced HEP.  Baseline:  Goal status: ONGOING 01/20/24  2.  Pt to demonstrate improved coordination of pelvic floor and breathing mechanics with 10# squat with appropriate synergistic patterns to decrease pain and leakage at least 75% of the time.   Baseline:  Goal status: ONGOING 01/20/24  3.  Pt will be able to lift at least 10 lb correctly for 10 reps without pain or leakage for functional activities to allow patient to pick up weighted objects in public without leaking urine.  Baseline:  Goal status: ONGOING 01/20/24  4.  Pt will demonstrate increase in all impaired hip strength by 1 muscle grades in order to demonstrate improved lumbopelvic support and increase functional ability.   Baseline:  Goal status: ONGOING 01/20/24  PLAN:  PT FREQUENCY: 2 more visits before end of August   PT DURATION: 2 months   PLANNED INTERVENTIONS: 97110-Therapeutic exercises, 97530- Therapeutic activity, 97112- Neuromuscular re-education, 97535- Self Care, [id number]- Manual therapy, Taping, Dry Needling, Joint mobilization, Spinal mobilization, Scar mobilization, Cryotherapy, and Moist heat  PLAN FOR NEXT SESSION: continued pelvic floor AROM training in seated, introduce hip and core strengthening, introduce downtraining stretches   Broderic Bara, PTA 03/05/2024, 1:02 PM

## 2024-03-08 ENCOUNTER — Ambulatory Visit: Admitting: Physical Therapy

## 2024-03-08 ENCOUNTER — Encounter: Payer: Self-pay | Admitting: Physical Therapy

## 2024-03-08 DIAGNOSIS — M25561 Pain in right knee: Secondary | ICD-10-CM | POA: Diagnosis not present

## 2024-03-08 DIAGNOSIS — G8929 Other chronic pain: Secondary | ICD-10-CM

## 2024-03-08 DIAGNOSIS — M5459 Other low back pain: Secondary | ICD-10-CM

## 2024-03-08 DIAGNOSIS — R293 Abnormal posture: Secondary | ICD-10-CM

## 2024-03-08 DIAGNOSIS — R279 Unspecified lack of coordination: Secondary | ICD-10-CM

## 2024-03-08 DIAGNOSIS — M6281 Muscle weakness (generalized): Secondary | ICD-10-CM

## 2024-03-08 NOTE — Therapy (Signed)
 OUTPATIENT PHYSICAL THERAPY FEMALE PELVIC TREATMENT   Patient Name: Chelsea Bell MRN: [id number] DOB:12-10-1955, 68 y.o., female Today's Date: 03/08/2024  END OF SESSION:  PT End of Session - 03/08/24 1150     Visit Number 22    Date for PT Re-Evaluation 03/16/24    Authorization Type [employer] Medicare    Progress Note Due on Visit 30    PT Start Time 1100    PT Stop Time 1147    PT Time Calculation (min) 47 min    Activity Tolerance Patient tolerated treatment well    Behavior During Therapy WFL for tasks assessed/performed           Past Medical History:  Diagnosis Date   Cancer (HCC)    endometrial cancer- surgery only   Concussion    GERD (gastroesophageal reflux disease)    Past Surgical History:  Procedure Laterality Date   ABDOMINAL HYSTERECTOMY     COLONOSCOPY WITH PROPOFOL  N/A 06/09/2014   Procedure: COLONOSCOPY WITH PROPOFOL ;  Surgeon: Lamar Donnald GAILS, MD;  Location: [facility];  Service: Endoscopy;  Laterality: N/A;   DILATION AND CURETTAGE OF UTERUS     LAPAROSCOPIC OVARIAN     diagnostic with 1 ovary removed , then Hysterectomy(endometrial cancer) with other ovary removed   TONSILLECTOMY     Patient Active Problem List   Diagnosis Date Noted   Overactive bladder 10/29/2023   SUI (stress urinary incontinence, female) 10/29/2023   Chest pain 10/07/2021   PCP: Marvine Rush, MD  REFERRING PROVIDER: [name], MD   REFERRING DIAG: N32.81 (ICD-10-CM) - Overactive bladder N39.3 (ICD-10-CM) - SUI (stress urinary incontinence, female)  THERAPY DIAG:  Chronic pain of both knees  Muscle weakness (generalized)  Other low back pain  Abnormal posture  Unspecified lack of coordination  Rationale for Evaluation and Treatment: Rehabilitation  ONSET DATE: unknown   SUBJECTIVE:                                                                                                                                                                                            SUBJECTIVE STATEMENT: Patient reports last week was one of her best weeks. Yesterday she had lightheadedness at church and had to sit for a while and she felt it later on today. This morning she did not have lightheadedness but she just felt off.   Eval: Patient reports to pelvic PT with urinary incontinence that has been getting progressively worse for many years. Some days she wears 2 pads, some days she has full bladder loss and requires more frequent changes. The pads she uses usually only catch 4-5  drops of urine when she does leak. No bowel related concerns to report. No pelvic pain to report.  Fluid intake: is trying to increase her water intake, she is trying to cut back on soda/caffeine.    PAIN:  Are you having pain? No NPRS scale: 0/10  PRECAUTIONS: None  RED FLAGS: None   WEIGHT BEARING RESTRICTIONS: No  FALLS:  Has patient fallen in last 6 months? 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, is a Facilities manager   ACTIVITY LEVEL : not currently exercising consistently   PLOF: Independent  PATIENT GOALS: to decrease urinary leakage and control bladder better   PERTINENT HISTORY:  Has plantar fasciitis and she uses a cane to help her ambulate  Hx: GERD, endometrial cancer (surgery), abdominal hysterectomy, ovarian removal  Sexual abuse: No  BOWEL MOVEMENT: Pain with bowel movement: No Type of bowel movement:Type (Bristol Stool Scale) 4, Frequency 1x/day, Strain no, and Splinting no Fully empty rectum: Yes:   Leakage: No Pads: No Fiber supplement/laxative No  URINATION: Pain with urination: No Fully empty bladder: Noshe feels like she empties, but there is some residual voiding volume  Stream: Weak Urgency: Yes  Frequency: feels like she goes all the time - goes 1-3x/night  Leakage: Urge to void, Walking to the bathroom, Coughing, Sneezing, and Laughing Pads: Yes: long pads 4 or 5 drops   INTERCOURSE: not currently  sexually active   PREGNANCY: Vaginal deliveries 0 C-section deliveries 0  PROLAPSE: None  OBJECTIVE:  Note: Objective measures were completed at Evaluation unless otherwise noted.  PATIENT SURVEYS: PFIQ-7: 14  COGNITION: Overall cognitive status: Within functional limits for tasks assessed     SENSATION: Light touch: Appears intact  LUMBAR SPECIAL TESTS:  Single leg stance test: Positive  FUNCTIONAL TESTS:  Squat: mild dynamic knee valgus bilaterally with sit to stand transfer   GAIT: Assistive device utilized: Single point cane Comments: moderate trendelenburg gait pattern with ambulation   POSTURE: rounded shoulders, forward head, increased thoracic kyphosis, and flexed trunk    LUMBARAROM/PROM: within functional limits   A/PROM A/PROM  eval  Flexion   Extension   Right lateral flexion   Left lateral flexion   Right rotation   Left rotation    (Blank rows = not tested)  LOWER EXTREMITY ROM: within functional limits   Active ROM Right eval Left eval  Hip flexion    Hip extension    Hip abduction    Hip adduction    Hip internal rotation    Hip external rotation    Knee flexion    Knee extension    Ankle dorsiflexion    Ankle plantarflexion    Ankle inversion    Ankle eversion     (Blank rows = not tested)  LOWER EXTREMITY MMT: 4-/5 bilateral knees and hips grossly   MMT Right eval Left eval  Hip flexion    Hip extension    Hip abduction    Hip adduction    Hip internal rotation    Hip external rotation    Knee flexion    Knee extension    Ankle dorsiflexion    Ankle plantarflexion    Ankle inversion    Ankle eversion     (Blank rows = not tested) PALPATION:   General: no significant tenderness to palpation at bilateral adductors or hip flexors   Pelvic Alignment: within normal limits   Abdominal: decreased rib excursion with inhalation, abdominal bracing at rest, upper chest breathing at  rest                External Perineal  Exam: mild dryness noted with sufficient clitoral hood mobility present                               Internal Pelvic Floor: Patient fully consents to today's internal examination. She demonstrates a stiffness throughout the superficial and deep layers of the pelvic floor bilaterally, but no tenderness to palpation of superficial or deep musculature throughout. Patient demonstrates a lack of coordination in the pelvic floor with inhalation/exhalation. Following internal cueing, patient was able to actively lengthen and shorten the pelvic floor with her breathing techniques. Repetitive pelvic floor contractions were challenging for the patient.   Patient confirms identification and approves PT to assess internal pelvic floor and treatment Yes No emotional/communication barriers or cognitive limitation. Patient is motivated to learn. Patient understands and agrees with treatment goals and plan. PT explains patient will be examined in standing, sitting, and lying down to see how their muscles and joints work. When they are ready, they will be asked to remove their underwear so PT can examine their perineum. The patient is also given the option of providing their own chaperone as one is not provided in our facility. The patient also has the right and is explained the right to defer or refuse any part of the evaluation or treatment including the internal exam. With the patient's consent, PT will use one gloved finger to gently assess the muscles of the pelvic floor, seeing how well it contracts and relaxes and if there is muscle symmetry. After, the patient will get dressed and PT and patient will discuss exam findings and plan of care. PT and patient discuss plan of care, schedule, attendance policy and HEP activities.  PELVIC MMT:   MMT eval  Vaginal 3/5 manual muscle test score, 5 quick flicks that were challenging, 0 second hold  Internal Anal Sphincter   External Anal Sphincter   Puborectalis    Diastasis Recti   (Blank rows = not tested)        TONE: Within normal limits   PROLAPSE: No significant anterior or posterior vaginal wall laxity present   TODAY'S TREATMENT:                                                                                                                              DATE:   03/08/2024 NuStep Level 3 - PT present to discuss status 3way step with green TB around ankles x 8 each direction Hurdles (forwards & sideways) -step over pattern x 4 laps at barre Seated ankle inversion/eversion with red TB 2 x 10 bilateral  Ecc step down with 4in step on ground 2 x 8 bilateral (max verbal and visual cues. Patient able to decrease UE support on second set)    03/05/24:Pt arrives for aquatic physical therapy. Treatment took  place in 3.5-5.5 feet of water. Water temperature was 91 degrees F. Pt entered the pool via stairs slowly and with heavy use of the hand rail. Pt ambulates to pool deck independently.Pt requires buoyancy of water for support and to offload joints with strengthening exercises.  Seated water bench with 75% submersion Pt performed seated LE AROM exercises 20x in all planes, with concurrent discussion on status. 75% depth water walking with UE push/pull with rainbow floats 10x in each direction. Gentle hip 3 ways kicks 20x Bil. High knee marching across pool 6x holding teal noodle for balance. Core lat press with 2 hand floats 2x15. 1 step step ups 10x each leg holding both rails, then 5x taking 2 reciprocal LE steps. Pt ended with lumbar stretch in standing.     01/14/24: Neuro re-ed: standing diaphragmatic breathing + pelvic floor lengthening + shortening with inhalation/exhalation 2x10  seated diaphragmatic breathing + pelvic floor lengthening + shortening with inhalation/exhalation 2x10  Self care: Relative pelvic floor anatomy and the connection between the diaphragm and pelvic floor, intraabdominal pressure management and how this affects  our pelvic floor musculature Knack technique for stress urinary incontinence  Toileting mechanics and techniques to fully empty rectum when passing bowel movements  In the meantime while you recover from your concussion: Hold off on all pelvic floor exercises other than these:  Standing/seated pelvic floor contractions: Range of motion training: 2x10  Sit or stand with your hands on your stomach. Inhale as your tummy fills with air like a balloon filling up, and your pelvic floor relaxes. Exhale as you perform a kegel while you blow out.  Quick flicks: 2x10  Sit or stand and perform 10 quick kegels in a row, making sure you rest between each contraction  Rest as much as needed, avoid high intensity movement or exercise, limit screen time if you can, stay extra hydrated during this time   01/20/24: Neuro re-ed: standing diaphragmatic breathing + pelvic floor lengthening + shortening with inhalation/exhalation 2x10  Sit to stand with kegel at top + diaphragmatic breathing 2x8  Standing alternating march + kegel at top of march + diaphragmatic breathing 2x10 each  Sidelying clam (RTB) + reverse clamshell + diaphragmatic breathing 2x8  Therapeutic exercise:  Standing hip abduction with RTB around ankles + diaphragmatic breathing 2x10  Standing alternating march + kegel at top of march + diaphragmatic breathing 2x10 each  Self care: Relative pelvic floor anatomy and the connection between the diaphragm and pelvic floor, intraabdominal pressure management and how this affects our pelvic floor musculature Knack technique for stress urinary incontinence  Toileting mechanics and techniques to fully empty rectum when passing bowel movements    PATIENT EDUCATION:  Education details: Relative pelvic floor anatomy and the connection between the diaphragm and pelvic floor, intraabdominal pressure management and how this affects our pelvic floor musculature Person educated: Patient Education method:  Explanation, Demonstration, Tactile cues, Verbal cues, and Handouts Education comprehension: verbalized understanding, returned demonstration, verbal cues required, tactile cues required, and needs further education  HOME EXERCISE PROGRAM: Access Code: [email or web address] URL: [email or web address] Date: 03/08/2024 Prepared by: Kristeen Sar  Exercises - Standing Pelvic Floor Contraction  - 1 x daily - 7 x weekly - 2 sets - 10 reps - Standing Marching  - 1 x daily - 7 x weekly - 2 sets - 10 reps - Sit to Stand Without Arm Support  - 1 x daily - 7 x weekly - 2 sets - 8-10 reps - Clam with Resistance  -  1 x daily - 7 x weekly - 2 sets - 10 reps - Sidelying Reverse Clamshell  - 1 x daily - 7 x weekly - 2 sets - 10 reps - Supine Bridge with Resistance Band  - 1 x daily - 7 x weekly - 2 sets - 10 reps - Seated Hip Abduction  - 1 x daily - 7 x weekly - 2 sets - 10 reps - Standing Hip Abduction with Resistance at Ankles and Counter Support  - 1 x daily - 7 x weekly - 2 sets - 10 reps - Long Sitting Ankle Inversion with Anchored Resistance  - 1 x daily - 7 x weekly - 2 sets - 8-10 reps - Seated Ankle Inversion with Resistance and Legs Crossed  - 1 x daily - 7 x weekly - 2 sets - 8-10 reps - Seated Ankle Eversion with Resistance  - 1 x daily - 7 x weekly - 2 sets - 8-10 reps  ASSESSMENT:  CLINICAL IMPRESSION:  Patient presents with no increased pain, but with complaints of lightheadedness yesterday at church. This occurred two times yesterday. Patient verbalized feelingoff today, but no lightheadedness. Treatment session focused on hip and quad strengthening. Patient has been trying to incorporate arm swing during gait but it has been challenging to maintain opposite arm/leg movements. She tolerated all standing exercises well with no increased pain or discomfort. Eccentric step down exercise was a good challenge for patient. She required a raised surface due to quad weakness and max UE. Patient  will benefit from skilled PT to address the below impairments and improve overall function.    OBJECTIVE IMPAIRMENTS: decreased coordination, decreased endurance, decreased mobility, decreased ROM, and decreased strength.   ACTIVITY LIMITATIONS: continence  PARTICIPATION LIMITATIONS: N/A  PERSONAL FACTORS: Age, Past/current experiences, and Time since onset of injury/illness/exacerbation are also affecting patient's functional outcome.   REHAB POTENTIAL: Good  CLINICAL DECISION MAKING: Stable/uncomplicated  EVALUATION COMPLEXITY: Low   GOALS: Goals reviewed with patient? Yes  SHORT TERM GOALS: Target date: 11/28/2023  Pt will be independent with HEP.  Baseline: Goal status: GOAL MET 01/20/24  2.  Pt will be independent with the knack, urge suppression technique, and double voiding in order to improve bladder habits and decrease urinary incontinence.   Baseline:  Goal status: GOAL MET 01/20/24  3.  Pt will report no leaks with laughing, coughing, sneezing in order to improve comfort with interpersonal relationships and community activities so that she does not have to change pads multiple times per day. Baseline:  Goal status: GOAL MET 01/20/24  LONG TERM GOALS: Target date: 05/01/2024  Pt will be independent with advanced HEP.  Baseline:  Goal status: ONGOING 01/20/24  2.  Pt to demonstrate improved coordination of pelvic floor and breathing mechanics with 10# squat with appropriate synergistic patterns to decrease pain and leakage at least 75% of the time.   Baseline:  Goal status: ONGOING 01/20/24  3.  Pt will be able to lift at least 10 lb correctly for 10 reps without pain or leakage for functional activities to allow patient to pick up weighted objects in public without leaking urine.  Baseline:  Goal status: ONGOING 01/20/24  4.  Pt will demonstrate increase in all impaired hip strength by 1 muscle grades in order to demonstrate improved lumbopelvic support and increase  functional ability.   Baseline:  Goal status: ONGOING 01/20/24  PLAN:  PT FREQUENCY: 2 more visits before end of August   PT DURATION: 2 months  PLANNED INTERVENTIONS: 97110-Therapeutic exercises, 97530- Therapeutic activity, 97112- Neuromuscular re-education, 97535- Self Care, [id number]- Manual therapy, Taping, Dry Needling, Joint mobilization, Spinal mobilization, Scar mobilization, Cryotherapy, and Moist heat  PLAN FOR NEXT SESSION: assess response to treatment session; continue core and hip strengthening; continued pelvic floor AROM training in seated, introduce hip and core strengthening, introduce downtraining stretches   Kristeen Sar, PT 03/08/24 11:52 AM

## 2024-03-11 ENCOUNTER — Encounter: Payer: Self-pay | Admitting: Physical Therapy

## 2024-03-11 ENCOUNTER — Ambulatory Visit: Admitting: Physical Therapy

## 2024-03-11 DIAGNOSIS — G8929 Other chronic pain: Secondary | ICD-10-CM

## 2024-03-11 DIAGNOSIS — M6281 Muscle weakness (generalized): Secondary | ICD-10-CM

## 2024-03-11 DIAGNOSIS — M25561 Pain in right knee: Secondary | ICD-10-CM | POA: Diagnosis not present

## 2024-03-11 DIAGNOSIS — R293 Abnormal posture: Secondary | ICD-10-CM

## 2024-03-11 DIAGNOSIS — R279 Unspecified lack of coordination: Secondary | ICD-10-CM

## 2024-03-11 DIAGNOSIS — M5459 Other low back pain: Secondary | ICD-10-CM

## 2024-03-11 NOTE — Therapy (Signed)
 OUTPATIENT PHYSICAL THERAPY LOWER EXTREMITY TREATMENT   Patient Name: Chelsea Bell MRN: [id number] DOB:14-Jul-1956, 68 y.o., female Today's Date: 03/11/2024  END OF SESSION:  PT End of Session - 03/11/24 1313     Visit Number 23    Date for PT Re-Evaluation 03/16/24    Authorization Type [employer] Medicare    Progress Note Due on Visit 30    PT Start Time 1147    PT Stop Time 1230    PT Time Calculation (min) 43 min    Activity Tolerance Patient tolerated treatment well    Behavior During Therapy WFL for tasks assessed/performed                    Past Medical History:  Diagnosis Date   Cancer (HCC)    endometrial cancer- surgery only   Concussion    GERD (gastroesophageal reflux disease)    Past Surgical History:  Procedure Laterality Date   ABDOMINAL HYSTERECTOMY     COLONOSCOPY WITH PROPOFOL  N/A 06/09/2014   Procedure: COLONOSCOPY WITH PROPOFOL ;  Surgeon: Lamar Donnald GAILS, MD;  Location: [facility];  Service: Endoscopy;  Laterality: N/A;   DILATION AND CURETTAGE OF UTERUS     LAPAROSCOPIC OVARIAN     diagnostic with 1 ovary removed , then Hysterectomy(endometrial cancer) with other ovary removed   TONSILLECTOMY     Patient Active Problem List   Diagnosis Date Noted   Overactive bladder 10/29/2023   SUI (stress urinary incontinence, female) 10/29/2023   Chest pain 10/07/2021    PCP: Marvine Rush, MD  REFERRING PROVIDER: Marvine Rush, MD  REFERRING DIAG: Rt 26.7 Gait training  THERAPY DIAG:  Chronic pain of both knees  Muscle weakness (generalized)  Other low back pain  Abnormal posture  Unspecified lack of coordination  Rationale for Evaluation and Treatment: Rehabilitation  ONSET DATE:  01/11/2024  SUBJECTIVE:   SUBJECTIVE STATEMENT: Patient reports she is doing good today. She had not had any dizziness since Monday.   Eval: Patient presents with pain in her feet and ankles. June 22 she had a bad fall. She had stool in her  bathroom and she tripped over it and she hit her head on the door frame. She has multiple bruises on her head, knee, and ankle. Patient denies a brain bleed and any broken bones. She had a follow up with the orthopedic doctor and she has multiple appointments coming up. She has a concussion and does not get frequent headaches but she gets occasional twinges and sharp pains. She still get very fatigued and she wants to rest majority of the day. Her left knee is her bad knee. She does not use her straight cane in her house, but she used out in the community [employer], appointments etc). She sometimes uses the scooter when she grocery shopes. Her goal is to not use the straight cane. She feels she is too young to be using a straight cane.   PERTINENT HISTORY: Currently in pelvic therapy; hx cancer, B plantar fasciitis (chronic) PAIN:  Are you having pain?PAIN:  Are you having pain? Yes, low back NPRS scale:/[phone] Pain location:low back  PAIN TYPE: aching and dull Pain description: intermittent and aching  Aggravating factors: over doing it Relieving factors: seated foot massage device, ice bottles, massage gun   PRECAUTIONS: Fall and Other: Concussion   RED FLAGS: None   WEIGHT BEARING RESTRICTIONS: No  FALLS:  Has patient fallen in last 6 months? Yes. Number of falls 1 fall  01/11/2024 see details above  LIVING ENVIRONMENT: Lives with: lives alone Lives in: House/apartment Stairs: Yes: Internal: 3.5 steps; on left going up Has following equipment at home: Single point cane, Shower bench, and Grab bars  OCCUPATION: Not currently working  PLOF: Independent, Independent with basic ADLs, Independent with household mobility without device, Independent with community mobility with device, Independent with gait, Independent with transfers, and Leisure: teaches painting classes   PATIENT GOALS: To get rid of straight cane; To be able to walk up the steps normally; to be more mobile     NEXT MD VISIT: This week with PCP  OBJECTIVE:  Note: Objective measures were completed at Evaluation unless otherwise noted.  DIAGNOSTIC FINDINGS: Patient had imaging done on 01/11/2024 after fall. Head CT, CT C-spine, Knee, and shoulder imaging was normal no concerns.  PATIENT SURVEYS:  LEFS: 38/80 47.5   COGNITION: Overall cognitive status: Within functional limits for tasks assessed     SENSATION: WFL   POSTURE: rounded shoulders and forward head    LOWER EXTREMITY ROM: WFL bilateral    LOWER EXTREMITY MMT: Grossly 4/5 bilateral     FUNCTIONAL TESTS:  5 times sit to stand: 13.80 sec ; hands on knees; some pain in right hip Timed up and go (TUG): 12.48 sec no straight cane; 11.40 sec with straight cane  02/02/24 748 ft and RPE 6-7/10  - pressure starts but less than 2/10  03/01/2024 677ft ft no cane (antalgic gait) verbal cues for arm swing, ankle pain started at 2 mins RPE 5/10  03/11/2024 : 73ft no cane; ankle pain after 2 mins; no knee pain during walk (ankles were limiting factor)  GAIT: Assistive device utilized: Single point cane Comments: antalgic gait                                                                                                                                TREATMENT DATE:  03/11/2024 NuStep Level 3 - PT present to discuss status 772ft no pain; ankle pain after 2 mins; no knee pain during walk (ankles were limiting factor) Seated ankle inversion/eversion with red TB 2 x 10 bilateral Ecc step down with 4in step on ground 2 x 8 bilateral (max verbal and visual cues. Patient able to decrease UE support on second set)      03/08/2024 NuStep Level 3 - PT present to discuss status 3way step with green TB around ankles x 8 each direction Hurdles (forwards & sideways) -step over pattern x 4 laps at barre Seated ankle inversion/eversion with red TB 2 x 10 bilateral  Ecc step down with 4in step on ground 2 x 8  bilateral (max verbal and visual cues. Patient able to decrease UE support on second set)       03/05/24:Pt arrives for aquatic physical therapy. Treatment took place in 3.5-5.5 feet of water. Water temperature was 91 degrees F. Pt entered the pool via stairs  slowly and with heavy use of the hand rail. Pt ambulates to pool deck independently.Pt requires buoyancy of water for support and to offload joints with strengthening exercises.  Seated water bench with 75% submersion Pt performed seated LE AROM exercises 20x in all planes, with concurrent discussion on status. 75% depth water walking with UE push/pull with rainbow floats 10x in each direction. Gentle hip 3 ways kicks 20x Bil. High knee marching across pool 6x holding teal noodle for balance. Core lat press with 2 hand floats 2x15. 1 step step ups 10x each leg holding both rails, then 5x taking 2 reciprocal LE steps. Pt ended with lumbar stretch in standing.      PATIENT EDUCATION:  Education details: PT eval findings, anticipated POC, progress with PT, and initial HEP Person educated: Patient Education method: Explanation, Demonstration, and Handouts Education comprehension: verbalized understanding, returned demonstration, and needs further education  HOME EXERCISE PROGRAM: Access Code: [email or web address] URL: [email or web address] Date: 03/11/2024 Prepared by: Kristeen Sar  Exercises - Seated Long Arc Quad  - 2 x daily - 7 x weekly - 2 sets - 10 reps - 2 hold - Long Sitting Ankle Pumps  - 1 x daily - 7 x weekly - 2 sets - 10 reps - Seated Heel Raise  - 2 x daily - 7 x weekly - 2 sets - 10 reps - Seated Ankle Alphabet  - 2 x daily - 7 x weekly - 1 sets - 1 reps - Seated March  - 2 x daily - 7 x weekly - 2 sets - 10 reps - Seated Ankle Dorsiflexion AROM  - 2 x daily - 7 x weekly - 2 sets - 10 reps - Forward Step Up with Counter Support  - 1 x daily - 7 x weekly - 1 sets - 5 reps - Seated Hamstring Stretch  - 2 x daily - 7 x  weekly - 1 sets - 3 reps - 20 hold - Seated Figure 4 Piriformis Stretch  - 2 x daily - 7 x weekly - 1 sets - 3 reps - 20 hold - Seated Hip External Rotation AROM  - 2 x daily - 7 x weekly - 1-2 sets - 10 reps - Side Stepping with Resistance at Feet  - 1 x daily - 7 x weekly - 3 sets - 5 reps - Seated Ankle Inversion with Resistance and Legs Crossed  - 1 x daily - 7 x weekly - 2 sets - 10 reps - Long Sitting Ankle Inversion with Anchored Resistance  - 1 x daily - 7 x weekly - 2 sets - 10 reps - Seated Ankle Eversion with Resistance  - 1 x daily - 7 x weekly - 2 sets - 10 reps   Pt Education:  [email or web address]  ASSESSMENT:  CLINICAL IMPRESSION:  Nathanel presents with increased ankle pain today. Reviewed updated HEP with ankle strengthening exercises. Patient required verbal and visual cues for correct exercise performance. Patient performed gait training with no cane today for 749ft. Her ankle pain was a limited factor in this distance. Patient is highly motivated and wants to improve functional mobility and walking tolerance. Provided patient education and discussion on quality of life and stress management. Discussed motivators, challenges, and goals for future.  Patient will benefit from skilled PT to address the below impairments and improve overall function.    Follow up with PCP aug 28 Eval: Patient is a 68 y.o. female who was seen today for physical therapy evaluation and treatment  for gait training. Nathanel presents with chronic bilateral foot and ankle that was re aggravated January 11, 2024 after a fall. She fell in her bathroom and she hit her head on the door threshold. She had imaging down and everything came back unremarkable. She does have a concussion and she is limited in her able to bend and pick up items and she gets occasional sharp pains in her head. She denies frequent headaches. She goal is to  initial a gym routine and stay mobile. Patient would benefit from some sessions of aquatic therapy to establish an aquatic program to perform on her own. Patient is motivated and wants to get better. Patient will benefit from skilled PT to address the below impairments and improve overall function.    OBJECTIVE IMPAIRMENTS: Abnormal gait, decreased balance, decreased endurance, difficulty walking, decreased ROM, decreased strength, increased muscle spasms, prosthetic dependency , and pain.   ACTIVITY LIMITATIONS: carrying, lifting, bending, standing, squatting, stairs, transfers, and locomotion level  PARTICIPATION LIMITATIONS: meal prep, cleaning, laundry, interpersonal relationship, shopping, community activity, and church  PERSONAL FACTORS: Age, Fitness, and Time since onset of injury/illness/exacerbation are also affecting patient's functional outcome.   REHAB POTENTIAL: Good  CLINICAL DECISION MAKING: Evolving/moderate complexity  EVALUATION COMPLEXITY: Moderate   GOALS: Goals reviewed with patient? Yes  SHORT TERM GOALS: Target date: 02/17/2024  Patient will be independent with initial HEP. Baseline:  Goal status: MET  2.  Patient will be able to participant a to establish a baseline for functional test. Baseline: will test next visit due to foot pain today (02/16/24) Goal status: INITIAL   3.  Patient will perform 5STS in < or = to 12 sec for improved functional mobility. Baseline:  Goal status: INITIAL    LONG TERM GOALS: Target date: 03/16/2024  Patient will demonstrate independence in advanced HEP. Baseline:  Goal status: INITIAL  2.  Patient will report > or = to 50% improvement in ankle and foot pain since starting PT. Baseline:  Goal status: INITIAL  3.  Patient will verbalize and demonstrate self-care strategies to manage pain including tissue mobility practices and change of position. Baseline:  Goal status: INITIAL  4.  Patient will score > or =  to 48/80 on LEFS due to improved function. Baseline: 38/80 Goal status: INITIAL  5.  Patient will be able to stand long enough to wash dishes with < or = to 2/10 back pain. Baseline: has to take rests Goal status: INITIAL  6.  Patient will demonstrate proper posture when bending and lifting to prevent back injury. Baseline:  Goal status: INITIAL   PLAN:  PT FREQUENCY: 2x/week  PT DURATION: 8 weeks  PLANNED INTERVENTIONS: [id number]- PT Re-evaluation, 97110-Therapeutic exercises, 97530- Therapeutic activity, 97112- Neuromuscular re-education, 97535- Self Care, [id number]- Manual therapy, [phone]- Gait training, [phone]- Canalith repositioning, [id number]- Aquatic Therapy, [phone]- Electrical stimulation (unattended), [phone]- Electrical stimulation (manual), [id number]- Vasopneumatic device, [id number]- Ultrasound, [id number]- Ionotophoresis 4mg /ml Dexamethasone , 79439 (1-2 muscles), [id number] (3+ muscles)- Dry Needling, Patient/Family education, Balance training, Stair training, Taping, Joint mobilization, Joint manipulation, Spinal manipulation, Spinal mobilization, Vestibular training, Cryotherapy, and Moist heat  PLAN FOR NEXT SESSION: re certification next treatment session; standing/ walking tolerance; balance; LE strengthening; ankle strengthening & ROM; core strengthening      Kristeen Sar, PT 03/11/24 1:14 PM [facility] [address] [place], [place] [address] Phone # [phone] Fax [phone]

## 2024-03-15 ENCOUNTER — Encounter: Payer: Self-pay | Admitting: Physical Therapy

## 2024-03-15 ENCOUNTER — Ambulatory Visit: Admitting: Physical Therapy

## 2024-03-15 DIAGNOSIS — M6281 Muscle weakness (generalized): Secondary | ICD-10-CM

## 2024-03-15 DIAGNOSIS — M5459 Other low back pain: Secondary | ICD-10-CM

## 2024-03-15 DIAGNOSIS — G8929 Other chronic pain: Secondary | ICD-10-CM

## 2024-03-15 DIAGNOSIS — M25561 Pain in right knee: Secondary | ICD-10-CM | POA: Diagnosis not present

## 2024-03-15 NOTE — Therapy (Signed)
 OUTPATIENT PHYSICAL THERAPY LOWER EXTREMITY TREATMENT/ RE-CERTIFICATION   Patient Name: Chelsea Bell MRN: [id number] DOB:1956/02/14, 68 y.o., female Today's Date: 03/15/2024  END OF SESSION:  PT End of Session - 03/15/24 1150     Visit Number 24    Date for PT Re-Evaluation 04/23/24    Authorization Type [employer] Medicare    Progress Note Due on Visit 30    PT Start Time 1104    PT Stop Time 1146    PT Time Calculation (min) 42 min    Activity Tolerance Patient tolerated treatment well    Behavior During Therapy WFL for tasks assessed/performed                     Past Medical History:  Diagnosis Date   Cancer (HCC)    endometrial cancer- surgery only   Concussion    GERD (gastroesophageal reflux disease)    Past Surgical History:  Procedure Laterality Date   ABDOMINAL HYSTERECTOMY     COLONOSCOPY WITH PROPOFOL  N/A 06/09/2014   Procedure: COLONOSCOPY WITH PROPOFOL ;  Surgeon: Lamar Donnald GAILS, MD;  Location: [facility];  Service: Endoscopy;  Laterality: N/A;   DILATION AND CURETTAGE OF UTERUS     LAPAROSCOPIC OVARIAN     diagnostic with 1 ovary removed , then Hysterectomy(endometrial cancer) with other ovary removed   TONSILLECTOMY     Patient Active Problem List   Diagnosis Date Noted   Overactive bladder 10/29/2023   SUI (stress urinary incontinence, female) 10/29/2023   Chest pain 10/07/2021    PCP: Marvine Rush, MD  REFERRING PROVIDER: Marvine Rush, MD  REFERRING DIAG: Rt 26.7 Gait training  THERAPY DIAG:  Chronic pain of both knees  Muscle weakness (generalized)  Other low back pain  Rationale for Evaluation and Treatment: Rehabilitation  ONSET DATE:  01/11/2024  SUBJECTIVE:   SUBJECTIVE STATEMENT: Patient reports she is okay today. Her ankles are hurting. Knee pain is okay today. She feels she is making progress with therapy but she does not feel like she is 100%. She verbalized feeling 30-40% but she is having more good days.    Eval: Patient presents with pain in her feet and ankles. June 22 she had a bad fall. She had stool in her bathroom and she tripped over it and she hit her head on the door frame. She has multiple bruises on her head, knee, and ankle. Patient denies a brain bleed and any broken bones. She had a follow up with the orthopedic doctor and she has multiple appointments coming up. She has a concussion and does not get frequent headaches but she gets occasional twinges and sharp pains. She still get very fatigued and she wants to rest majority of the day. Her left knee is her bad knee. She does not use her straight cane in her house, but she used out in the community [employer], appointments etc). She sometimes uses the scooter when she grocery shopes. Her goal is to not use the straight cane. She feels she is too young to be using a straight cane.   PERTINENT HISTORY: Currently in pelvic therapy; hx cancer, B plantar fasciitis (chronic) PAIN:  Are you having pain?PAIN:  Are you having pain? Yes, low back NPRS scale:/[phone] Pain location:low back  PAIN TYPE: aching and dull Pain description: intermittent and aching  Aggravating factors: over doing it Relieving factors: seated foot massage device, ice bottles, massage gun   PRECAUTIONS: Fall and Other: Concussion   RED FLAGS: None  WEIGHT BEARING RESTRICTIONS: No  FALLS:  Has patient fallen in last 6 months? Yes. Number of falls 1 fall 01/11/2024 see details above  LIVING ENVIRONMENT: Lives with: lives alone Lives in: House/apartment Stairs: Yes: Internal: 3.5 steps; on left going up Has following equipment at home: Single point cane, Shower bench, and Grab bars  OCCUPATION: Not currently working  PLOF: Independent, Independent with basic ADLs, Independent with household mobility without device, Independent with community mobility with device, Independent with gait, Independent with transfers, and Leisure: teaches painting classes    PATIENT GOALS: To get rid of straight cane; To be able to walk up the steps normally; to be more mobile    NEXT MD VISIT: This week with PCP  OBJECTIVE:  Note: Objective measures were completed at Evaluation unless otherwise noted.  DIAGNOSTIC FINDINGS: Patient had imaging done on 01/11/2024 after fall. Head CT, CT C-spine, Knee, and shoulder imaging was normal no concerns.  PATIENT SURVEYS:  LEFS: 38/80 47.5  03/15/2024 LEFS 35/80 43.8%   COGNITION: Overall cognitive status: Within functional limits for tasks assessed     SENSATION: WFL   POSTURE: rounded shoulders and forward head    LOWER EXTREMITY ROM: WFL bilateral    LOWER EXTREMITY MMT: Grossly 4/5 bilateral     FUNCTIONAL TESTS:  5 times sit to stand: 13.80 sec ; hands on knees; some pain in right hip Timed up and go (TUG): 12.48 sec no straight cane; 11.40 sec with straight cane  02/02/24 748 ft and RPE 6-7/10  - pressure starts but less than 2/10  03/01/2024 69ft ft no cane (antalgic gait) verbal cues for arm swing, ankle pain started at 2 mins RPE 5/10  03/11/2024 : 725ft no cane; ankle pain after 2 mins; no knee pain during walk (ankles were limiting factor)  03/15/2024 5STS 13.49 sec hands on knees; no pain; better performance than eval TUG: 11.22 sec no cane  GAIT: Assistive device utilized: Single point cane Comments: antalgic gait                                                                                                                                TREATMENT DATE:  03/15/2024 Discussion of goals & progress made with physical therapy & new goals for next few weeks LEFS 35/80 43.8% 5STS 13.49 sec hands on knees; no pain; better performance than eval TUG: 11.22 sec no cane Seated ankle PD/DF with rockerboard x 2 mins Standing calf raise on stair 2 x 10 Seated modified deadlift 5# 2 x 10    03/11/2024 NuStep Level 3 - PT present to discuss status 730ft no pain;  ankle pain after 2 mins; no knee pain during walk (ankles were limiting factor) Seated ankle inversion/eversion with red TB 2 x 10 bilateral Ecc step down with 4in step on ground 2 x 8 bilateral (max verbal and visual cues. Patient able to decrease UE support on second set)  03/08/2024 NuStep Level 3 - PT present to discuss status 3way step with green TB around ankles x 8 each direction Hurdles (forwards & sideways) -step over pattern x 4 laps at barre Seated ankle inversion/eversion with red TB 2 x 10 bilateral  Ecc step down with 4in step on ground 2 x 8 bilateral (max verbal and visual cues. Patient able to decrease UE support on second set)         PATIENT EDUCATION:  Education details: PT eval findings, anticipated POC, progress with PT, and initial HEP Person educated: Patient Education method: Explanation, Demonstration, and Handouts Education comprehension: verbalized understanding, returned demonstration, and needs further education  HOME EXERCISE PROGRAM: Access Code: [email or web address] URL: [email or web address] Date: 03/11/2024 Prepared by: Kristeen Sar  Exercises - Seated Long Arc Quad  - 2 x daily - 7 x weekly - 2 sets - 10 reps - 2 hold - Long Sitting Ankle Pumps  - 1 x daily - 7 x weekly - 2 sets - 10 reps - Seated Heel Raise  - 2 x daily - 7 x weekly - 2 sets - 10 reps - Seated Ankle Alphabet  - 2 x daily - 7 x weekly - 1 sets - 1 reps - Seated March  - 2 x daily - 7 x weekly - 2 sets - 10 reps - Seated Ankle Dorsiflexion AROM  - 2 x daily - 7 x weekly - 2 sets - 10 reps - Forward Step Up with Counter Support  - 1 x daily - 7 x weekly - 1 sets - 5 reps - Seated Hamstring Stretch  - 2 x daily - 7 x weekly - 1 sets - 3 reps - 20 hold - Seated Figure 4 Piriformis Stretch  - 2 x daily - 7 x weekly - 1 sets - 3 reps - 20 hold - Seated Hip External Rotation AROM  - 2 x daily - 7 x weekly - 1-2 sets - 10 reps - Side Stepping with Resistance at Feet  - 1 x  daily - 7 x weekly - 3 sets - 5 reps - Seated Ankle Inversion with Resistance and Legs Crossed  - 1 x daily - 7 x weekly - 2 sets - 10 reps - Long Sitting Ankle Inversion with Anchored Resistance  - 1 x daily - 7 x weekly - 2 sets - 10 reps - Seated Ankle Eversion with Resistance  - 1 x daily - 7 x weekly - 2 sets - 10 reps   Pt Education:  [email or web address]  ASSESSMENT:  CLINICAL IMPRESSION:  Re-certification completed today. [name] verbalizes feeling 30-40% better since starting therapy. Over the last two weeks she has had more ankle pain which has impacted her standing and walking tolerance. Because of this her LEFS score decreased some. Noted improvements in her TUG and 5STS from evaluation. Patient's main goal to achieve with therapy is to negotiate stairs with a reciprocal pattern and to transfer off the floor with no UE support. Educated patient on the importance of safety when performing these activities and realistic outcomes. She is progressing appropriately towards goals and added new goals today. She continues to demonstrate quad weakness and limited standing/walking tolerance due to ankle pain. Educated patient to talk to her PCP about her ankle pain she has been having. Patient will benefit from skilled PT to address the below impairments and improve overall function.     Eval: Patient is a 68 y.o. female who was seen today for physical therapy  evaluation and treatment for gait training. Nathanel presents with chronic bilateral foot and ankle that was re aggravated January 11, 2024 after a fall. She fell in her bathroom and she hit her head on the door threshold. She had imaging down and everything came back unremarkable. She does have a concussion and she is limited in her able to bend and pick up items and she gets occasional sharp pains in her head. She denies frequent headaches. She goal is to initial a  gym routine and stay mobile. Patient would benefit from some sessions of aquatic therapy to establish an aquatic program to perform on her own. Patient is motivated and wants to get better. Patient will benefit from skilled PT to address the below impairments and improve overall function.    OBJECTIVE IMPAIRMENTS: Abnormal gait, decreased balance, decreased endurance, difficulty walking, decreased ROM, decreased strength, increased muscle spasms, prosthetic dependency , and pain.   ACTIVITY LIMITATIONS: carrying, lifting, bending, standing, squatting, stairs, transfers, and locomotion level  PARTICIPATION LIMITATIONS: meal prep, cleaning, laundry, interpersonal relationship, shopping, community activity, and church  PERSONAL FACTORS: Age, Fitness, and Time since onset of injury/illness/exacerbation are also affecting patient's functional outcome.   REHAB POTENTIAL: Good  CLINICAL DECISION MAKING: Evolving/moderate complexity  EVALUATION COMPLEXITY: Moderate   GOALS: Goals reviewed with patient? Yes  SHORT TERM GOALS: Target date: 02/17/2024  Patient will be independent with initial HEP. Baseline:  Goal status: MET  2.  Patient will be able to participant a to establish a baseline for functional test. Baseline: will test next visit due to foot pain today (02/16/24) Goal status: MET 03/15/2024   3.  Patient will perform 5STS in < or = to 12 sec for improved functional mobility. Baseline:  Goal status: In progress (13.49 sec) 03/15/2024    LONG TERM GOALS: Target date: 04/23/2024  Patient will demonstrate independence in advanced HEP. Baseline:  Goal status: IN PROGRESS 03/15/2024  2.  Patient will report > or = to 50% improvement in ankle and foot pain since starting PT. Baseline:  Goal status: IN PROGRESS (30-40%) 03/15/2024  3.  Patient will verbalize and demonstrate self-care strategies to manage pain including tissue mobility practices and change of  position. Baseline:  Goal status: IN PROGRESS 03/15/2024  4.  Patient will score > or = to 48/80 on LEFS due to improved function. Baseline: 38/80 Goal status: IN PROGRESS (35/80) 03/15/2024  5.  Patient will be able to stand long enough to wash dishes with < or = to 2/10 back pain. Baseline: has to take rests Goal status: MET 03/15/2024  6.  Patient will demonstrate proper posture when bending and lifting to prevent back injury. Baseline:  Goal status:IN PROGRESS 03/15/2024  7.  Patient will be able to negotiate stairs with a reciprocal pattern with LRAD to improve functional mobility. Baseline:  Goal status:NEW  8.  Patient will ambulate > or = to 814ft with LRAD during for improved community mobility. Baseline:  Goal status:NEW    PLAN:  PT FREQUENCY: 2x/week  PT DURATION: 6 weeks  PLANNED INTERVENTIONS: 97164- PT Re-evaluation, 97110-Therapeutic exercises, 97530- Therapeutic activity, 97112- Neuromuscular re-education, 97535- Self Care, [id number]- Manual therapy, [phone]- Gait training, [phone]- Canalith repositioning, [id number]- Aquatic Therapy, [phone]- Electrical stimulation (unattended), [phone]- Electrical stimulation (manual), [id number]- Vasopneumatic device, [id number]- Ultrasound, [id number]- Ionotophoresis 4mg /ml Dexamethasone , 79439 (1-2 muscles), [id number] (3+ muscles)- Dry Needling, Patient/Family education, Balance training, Stair training, Taping, Joint mobilization, Joint manipulation, Spinal manipulation, Spinal mobilization, Vestibular training, Cryotherapy, and Moist heat  PLAN FOR NEXT SESSION: airex balance; standing/ walking tolerance; LE strengthening; ankle strengthening & ROM; core strengthening     Kristeen Sar, PT 03/15/24 11:51 AM [facility] [address], [address] [place], [place] [address] Phone # [phone] Fax [phone]

## 2024-03-17 ENCOUNTER — Ambulatory Visit: Admitting: Physical Therapy

## 2024-03-17 DIAGNOSIS — M25561 Pain in right knee: Secondary | ICD-10-CM | POA: Diagnosis not present

## 2024-03-17 DIAGNOSIS — G8929 Other chronic pain: Secondary | ICD-10-CM

## 2024-03-17 DIAGNOSIS — M5459 Other low back pain: Secondary | ICD-10-CM

## 2024-03-17 DIAGNOSIS — M6281 Muscle weakness (generalized): Secondary | ICD-10-CM

## 2024-03-17 NOTE — Patient Instructions (Signed)
 How to continue pelvic PT after discharge: It is important to continue working on these exercises even after discharge day. You can still make progressive pelvic floor improvements over time if you continue your exercises The goal from here forward is to exercise or work on your pelvic floor at least 2-3 days out of a 7 day period  If you are exercising 3 days out of your 7 day week, try dedicating one day to pelvic PT exercises (choose from any of the ones we have done - but always do the pelvic floor contraction exercise)  OR if you'd rather sprinkle in your Pelvic PT exercises, you can do them as a warm up or cool down for your other PT exercises  When it comes to maintenance of the pelvic floor, I would recommend doing the standing pelvic floor contraction exercise the most and the quick flick contractions throughout your day  When you have a high sense of urgency, you can do quick flick contractions to help decrease this sensation, OR you can practice the full urge drill  When you continue learning more orthopedic exercises, try adding an exhale to the movement during the most strenuous part of the movement  Urge Drill When you feel an urge to go, follow these steps to regain control: Stop what you are doing and be still Take one deep breath, directing your air into your abdomen Think an affirming thought, such as "I've got this." Do 5 quick flicks of your pelvic floor Walk with control to the bathroom to void, or delay voiding

## 2024-03-17 NOTE — Therapy (Signed)
 OUTPATIENT PHYSICAL THERAPY FEMALE PELVIC DISCHARGE   Patient Name: Chelsea Bell MRN: [id number] DOB:[birth year], 68 y.o., female Today's Date: 03/17/2024  END OF SESSION:  PT End of Session - 03/17/24 1138     Visit Number 25    Date for PT Re-Evaluation 04/23/24    Authorization Type [employer] Medicare    Progress Note Due on Visit 30    PT Start Time 1100    PT Stop Time 1140    PT Time Calculation (min) 40 min    Activity Tolerance Patient tolerated treatment well    Behavior During Therapy WFL for tasks assessed/performed          Past Medical History:  Diagnosis Date   Cancer (HCC)    endometrial cancer- surgery only   Concussion    GERD (gastroesophageal reflux disease)    Past Surgical History:  Procedure Laterality Date   ABDOMINAL HYSTERECTOMY     COLONOSCOPY WITH PROPOFOL  N/A 06/09/2014   Procedure: COLONOSCOPY WITH PROPOFOL ;  Surgeon: Lamar Donnald GAILS, MD;  Location: [facility];  Service: Endoscopy;  Laterality: N/A;   DILATION AND CURETTAGE OF UTERUS     LAPAROSCOPIC OVARIAN     diagnostic with 1 ovary removed , then Hysterectomy(endometrial cancer) with other ovary removed   TONSILLECTOMY     Patient Active Problem List   Diagnosis Date Noted   Overactive bladder 10/29/2023   SUI (stress urinary incontinence, female) 10/29/2023   Chest pain 10/07/2021   PCP: Marvine Rush, MD  REFERRING PROVIDER: [name], MD   REFERRING DIAG: N32.81 (ICD-10-CM) - Overactive bladder N39.3 (ICD-10-CM) - SUI (stress urinary incontinence, female)  THERAPY DIAG:  Chronic pain of both knees  Muscle weakness (generalized)  Other low back pain  Rationale for Evaluation and Treatment: Rehabilitation  ONSET DATE: unknown   SUBJECTIVE:                                                                                                                                                                                           SUBJECTIVE STATEMENT:  kathy Her allergies have been very bad recently. In general, her leakage has been better. She is wearing thin pads during the day (liner type). She woke up one night over the weekend with a high sense of urgency and she leaked a small amount before making it to the bathroom. She is amenable to discharge today. No pain to report.   Eval: Patient reports to pelvic PT with urinary incontinence that has been getting progressively worse for many years. Some days she wears 2 pads, some days she has full bladder loss and requires more  frequent changes. The pads she uses usually only catch 4-5 drops of urine when she does leak. No bowel related concerns to report. No pelvic pain to report.  Fluid intake: is trying to increase her water intake, she is trying to cut back on soda/caffeine   PAIN:  Are you having pain? No NPRS scale: 0/10  PRECAUTIONS: None  RED FLAGS: None   WEIGHT BEARING RESTRICTIONS: No  FALLS:  Has patient fallen in last 6 months? 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, is a Facilities manager   ACTIVITY LEVEL : not currently exercising consistently   PLOF: Independent  PATIENT GOALS: to decrease urinary leakage and control bladder better   PERTINENT HISTORY:  Has plantar fasciitis and she uses a cane to help her ambulate  Hx: GERD, endometrial cancer (surgery), abdominal hysterectomy, ovarian removal  Sexual abuse: No  BOWEL MOVEMENT: Pain with bowel movement: No Type of bowel movement:Type (Bristol Stool Scale) 4, Frequency 1x/day, Strain no, and Splinting no Fully empty rectum: Yes:   Leakage: No Pads: No Fiber supplement/laxative No  URINATION: Pain with urination: No Fully empty bladder: Noshe feels like she empties, but there is some residual voiding volume  Stream: Weak Urgency: Yes  Frequency: feels like she goes all the time - goes 1-3x/night  Leakage: Urge to void, Walking to the bathroom, Coughing, Sneezing, and Laughing Pads: Yes:  long pads 4 or 5 drops   INTERCOURSE: not currently sexually active   PREGNANCY: Vaginal deliveries 0 C-section deliveries 0  PROLAPSE: None  OBJECTIVE:  Note: Objective measures were completed at Evaluation unless otherwise noted.  PATIENT SURVEYS: PFIQ-7: 14  COGNITION: Overall cognitive status: Within functional limits for tasks assessed     SENSATION: Light touch: Appears intact  LUMBAR SPECIAL TESTS:  Single leg stance test: Positive  FUNCTIONAL TESTS:  Squat: mild dynamic knee valgus bilaterally with sit to stand transfer   GAIT: Assistive device utilized: Single point cane Comments: moderate trendelenburg gait pattern with ambulation   POSTURE: rounded shoulders, forward head, increased thoracic kyphosis, and flexed trunk    LUMBARAROM/PROM: within functional limits   A/PROM A/PROM  eval  Flexion   Extension   Right lateral flexion   Left lateral flexion   Right rotation   Left rotation    (Blank rows = not tested)  LOWER EXTREMITY ROM: within functional limits   Active ROM Right eval Left eval  Hip flexion    Hip extension    Hip abduction    Hip adduction    Hip internal rotation    Hip external rotation    Knee flexion    Knee extension    Ankle dorsiflexion    Ankle plantarflexion    Ankle inversion    Ankle eversion     (Blank rows = not tested)  LOWER EXTREMITY MMT: 4-/5 bilateral knees and hips grossly   MMT Right eval Left eval  Hip flexion    Hip extension    Hip abduction    Hip adduction    Hip internal rotation    Hip external rotation    Knee flexion    Knee extension    Ankle dorsiflexion    Ankle plantarflexion    Ankle inversion    Ankle eversion     (Blank rows = not tested) PALPATION:   General: no significant tenderness to palpation at bilateral adductors or hip flexors   Pelvic Alignment: within normal limits   Abdominal: decreased rib excursion with  inhalation, abdominal bracing at rest, upper chest  breathing at rest                External Perineal Exam: mild dryness noted with sufficient clitoral hood mobility present                               Internal Pelvic Floor: Patient fully consents to today's internal examination. She demonstrates a stiffness throughout the superficial and deep layers of the pelvic floor bilaterally, but no tenderness to palpation of superficial or deep musculature throughout. Patient demonstrates a lack of coordination in the pelvic floor with inhalation/exhalation. Following internal cueing, patient was able to actively lengthen and shorten the pelvic floor with her breathing techniques. Repetitive pelvic floor contractions were challenging for the patient.   Patient confirms identification and approves PT to assess internal pelvic floor and treatment Yes No emotional/communication barriers or cognitive limitation. Patient is motivated to learn. Patient understands and agrees with treatment goals and plan. PT explains patient will be examined in standing, sitting, and lying down to see how their muscles and joints work. When they are ready, they will be asked to remove their underwear so PT can examine their perineum. The patient is also given the option of providing their own chaperone as one is not provided in our facility. The patient also has the right and is explained the right to defer or refuse any part of the evaluation or treatment including the internal exam. With the patient's consent, PT will use one gloved finger to gently assess the muscles of the pelvic floor, seeing how well it contracts and relaxes and if there is muscle symmetry. After, the patient will get dressed and PT and patient will discuss exam findings and plan of care. PT and patient discuss plan of care, schedule, attendance policy and HEP activities.  PELVIC MMT:   MMT eval  Vaginal 3/5 manual muscle test score, 5 quick flicks that were challenging, 0 second hold  Internal Anal Sphincter    External Anal Sphincter   Puborectalis   Diastasis Recti   (Blank rows = not tested)        TONE: Within normal limits   PROLAPSE: No significant anterior or posterior vaginal wall laxity present   TODAY'S TREATMENT:                                                                                                                              DATE:   01/20/24: Neuro re-ed: standing diaphragmatic breathing + pelvic floor lengthening + shortening with inhalation/exhalation 2x10  Sit to stand with kegel at top + diaphragmatic breathing 2x8  Standing alternating march + kegel at top of march + diaphragmatic breathing 2x10 each  Sidelying clam (RTB) + reverse clamshell + diaphragmatic breathing 2x8  Therapeutic exercise:  Standing hip abduction with RTB around ankles + diaphragmatic breathing 2x10  Standing alternating march +  kegel at top of march + diaphragmatic breathing 2x10 each  Self care: Relative pelvic floor anatomy and the connection between the diaphragm and pelvic floor, intraabdominal pressure management and how this affects our pelvic floor musculature Knack technique for stress urinary incontinence  Toileting mechanics and techniques to fully empty rectum when passing bowel movements   03/04/24: Self care/neuro re-ed: Relative pelvic floor anatomy and the connection between the diaphragm and pelvic floor, intraabdominal pressure management and how this affects our pelvic floor musculature Knack technique for stress urinary incontinence  Toileting mechanics and techniques to fully empty rectum when passing bowel movements  Urge drill for urge urinary incontinence  standing diaphragmatic breathing + pelvic floor lengthening + shortening with inhalation/exhalation 2x10  Sit to stand with kegel at top + diaphragmatic breathing 2x8  Standing alternating march + kegel at top of march + diaphragmatic breathing 2x10 each  Sidelying clam (RTB) + reverse clamshell + diaphragmatic  breathing 2x8   03/17/24: discharge day  Self care/neuro re-ed: Relative pelvic floor anatomy and the connection between the diaphragm and pelvic floor, intraabdominal pressure management and how this affects our pelvic floor musculature Knack technique for stress urinary incontinence  Toileting mechanics and techniques to fully empty rectum when passing bowel movements  Urge drill for urge urinary incontinence  standing diaphragmatic breathing + pelvic floor lengthening + shortening with inhalation/exhalation 2x10  Sit to stand with kegel at top + diaphragmatic breathing 2x8  Standing alternating march + kegel at top of march + diaphragmatic breathing 2x10 each  Sidelying clam (RTB) + reverse clamshell + diaphragmatic breathing 2x8  How to continue implementing pelvic PT exercises outside of clinic   PATIENT EDUCATION:  Education details: Relative pelvic floor anatomy and the connection between the diaphragm and pelvic floor, intraabdominal pressure management and how this affects our pelvic floor musculature Person educated: Patient Education method: Explanation, Demonstration, Tactile cues, Verbal cues, and Handouts Education comprehension: verbalized understanding, returned demonstration, verbal cues required, tactile cues required, and needs further education  HOME EXERCISE PROGRAM: Access Code: [email or web address] URL: [email or web address] Date: 01/20/2024 Prepared by: Celena Domino  Exercises - Standing Pelvic Floor Contraction  - 1 x daily - 7 x weekly - 2 sets - 10 reps - Standing Marching  - 1 x daily - 7 x weekly - 2 sets - 10 reps - Sit to Stand Without Arm Support  - 1 x daily - 7 x weekly - 2 sets - 8-10 reps - Clam with Resistance  - 1 x daily - 7 x weekly - 2 sets - 10 reps - Sidelying Reverse Clamshell  - 1 x daily - 7 x weekly - 2 sets - 10 reps - Supine Bridge with Resistance Band  - 1 x daily - 7 x weekly - 2 sets - 10 reps - Seated Hip Abduction  - 1 x daily - 7  x weekly - 2 sets - 10 reps - Standing Hip Abduction with Resistance at Ankles and Counter Support  - 1 x daily - 7 x weekly - 2 sets - 10 reps  ASSESSMENT:  CLINICAL IMPRESSION: Patient is a 68 y.o. female  who was seen today for physical therapy treatment for stress incontinence. She has made great progress in PFPT and is no longer requiring use of long pads and is no longer leaking with coughing/sneezing/laughing. She has a good understanding of the urge drill for nighttime urgency and is pleased with her progress. She has met all goals and is amenable to  discharge at this time.   OBJECTIVE IMPAIRMENTS: decreased coordination, decreased endurance, decreased mobility, decreased ROM, and decreased strength.   ACTIVITY LIMITATIONS: continence  PARTICIPATION LIMITATIONS: N/A  PERSONAL FACTORS: Age, Past/current experiences, and Time since onset of injury/illness/exacerbation are also affecting patient's functional outcome.   REHAB POTENTIAL: Good  CLINICAL DECISION MAKING: Stable/uncomplicated  EVALUATION COMPLEXITY: Low   GOALS: Goals reviewed with patient? Yes  SHORT TERM GOALS: Target date: 11/28/2023  Pt will be independent with HEP.  Baseline: Goal status: GOAL MET 01/20/24  2.  Pt will be independent with the knack, urge suppression technique, and double voiding in order to improve bladder habits and decrease urinary incontinence.   Baseline:  Goal status: GOAL MET 01/20/24  3.  Pt will report no leaks with laughing, coughing, sneezing in order to improve comfort with interpersonal relationships and community activities so that she does not have to change pads multiple times per day. Baseline:  Goal status: GOAL MET 01/20/24  LONG TERM GOALS: Target date: 05/01/2024  Pt will be independent with advanced HEP.  Baseline:  Goal status: GOAL MET 03/17/24  2.  Pt to demonstrate improved coordination of pelvic floor and breathing mechanics with 10# squat with appropriate  synergistic patterns to decrease pain and leakage at least 75% of the time.   Baseline:  Goal status: GOAL MET 03/17/24  3.  Pt will be able to lift at least 10 lb correctly for 10 reps without pain or leakage for functional activities to allow patient to pick up weighted objects in public without leaking urine.  Baseline:  Goal status: GOAL MET 03/17/24  4.  Pt will demonstrate increase in all impaired hip strength by 1 muscle grades in order to demonstrate improved lumbopelvic support and increase functional ability.   Baseline:  Goal status: GOAL MET 03/17/24  PHYSICAL THERAPY DISCHARGE SUMMARY  Visits from Start of Care: 11  Current functional level related to goals / functional outcomes: See above   Remaining deficits: See above   Education / Equipment: See above   Patient agrees to discharge. Patient goals were met. Patient is being discharged due to meeting the stated rehab goals.   Celena JAYSON Domino, PT 03/17/2024, 11:38 AM

## 2024-03-18 ENCOUNTER — Ambulatory Visit

## 2024-03-18 DIAGNOSIS — M6281 Muscle weakness (generalized): Secondary | ICD-10-CM

## 2024-03-18 DIAGNOSIS — M25561 Pain in right knee: Secondary | ICD-10-CM | POA: Diagnosis not present

## 2024-03-18 DIAGNOSIS — G8929 Other chronic pain: Secondary | ICD-10-CM

## 2024-03-18 DIAGNOSIS — R293 Abnormal posture: Secondary | ICD-10-CM

## 2024-03-18 DIAGNOSIS — R252 Cramp and spasm: Secondary | ICD-10-CM

## 2024-03-18 DIAGNOSIS — R262 Difficulty in walking, not elsewhere classified: Secondary | ICD-10-CM

## 2024-03-18 DIAGNOSIS — M5459 Other low back pain: Secondary | ICD-10-CM

## 2024-03-18 NOTE — Therapy (Signed)
 OUTPATIENT PHYSICAL THERAPY LOWER EXTREMITY TREATMENT/ RE-CERTIFICATION   Patient Name: Chelsea Bell MRN: [id number] DOB:August 08, 1955, 68 y.o., female Today's Date: 03/18/2024  END OF SESSION:  PT End of Session - 03/18/24 1450     Visit Number 26    Number of Visits 10    Date for PT Re-Evaluation 04/23/24    Authorization Type [employer] Medicare    PT Start Time 1451    PT Stop Time 1530    PT Time Calculation (min) 39 min    Activity Tolerance Patient tolerated treatment well    Behavior During Therapy WFL for tasks assessed/performed                     Past Medical History:  Diagnosis Date   Cancer (HCC)    endometrial cancer- surgery only   Concussion    GERD (gastroesophageal reflux disease)    Past Surgical History:  Procedure Laterality Date   ABDOMINAL HYSTERECTOMY     COLONOSCOPY WITH PROPOFOL  N/A 06/09/2014   Procedure: COLONOSCOPY WITH PROPOFOL ;  Surgeon: Lamar Donnald GAILS, MD;  Location: [facility];  Service: Endoscopy;  Laterality: N/A;   DILATION AND CURETTAGE OF UTERUS     LAPAROSCOPIC OVARIAN     diagnostic with 1 ovary removed , then Hysterectomy(endometrial cancer) with other ovary removed   TONSILLECTOMY     Patient Active Problem List   Diagnosis Date Noted   Overactive bladder 10/29/2023   SUI (stress urinary incontinence, female) 10/29/2023   Chest pain 10/07/2021    PCP: Marvine Rush, MD  REFERRING PROVIDER: Marvine Rush, MD  REFERRING DIAG: Rt 26.7 Gait training  THERAPY DIAG:  Chronic pain of both knees  Muscle weakness (generalized)  Other low back pain  Difficulty in walking, not elsewhere classified  Abnormal posture  Cramp and spasm  Rationale for Evaluation and Treatment: Rehabilitation  ONSET DATE:  01/11/2024  SUBJECTIVE:   SUBJECTIVE STATEMENT: Patient reports she saw MD today for concussion and he states she is doing well.  She is 3 months post fall.  Primary MD wants patient to be screened by  neurologist due to continued mild symptoms. (Pain in head, light headed, feels weird, pressure)  She states her ankles hurt when she walks, the knee hurts when she is going up and down steps.    Eval: Patient presents with pain in her feet and ankles. June 22 she had a bad fall. She had stool in her bathroom and she tripped over it and she hit her head on the door frame. She has multiple bruises on her head, knee, and ankle. Patient denies a brain bleed and any broken bones. She had a follow up with the orthopedic doctor and she has multiple appointments coming up. She has a concussion and does not get frequent headaches but she gets occasional twinges and sharp pains. She still get very fatigued and she wants to rest majority of the day. Her left knee is her bad knee. She does not use her straight cane in her house, but she used out in the community [employer], appointments etc). She sometimes uses the scooter when she grocery shopes. Her goal is to not use the straight cane. She feels she is too young to be using a straight cane.   PERTINENT HISTORY: Currently in pelvic therapy; hx cancer, B plantar fasciitis (chronic) PAIN:  Are you having pain?PAIN:  Are you having pain? Yes, low back NPRS scale:/[phone] Pain location:low back  PAIN TYPE: aching  and dull Pain description: intermittent and aching  Aggravating factors: over doing it Relieving factors: seated foot massage device, ice bottles, massage gun   PRECAUTIONS: Fall and Other: Concussion   RED FLAGS: None   WEIGHT BEARING RESTRICTIONS: No  FALLS:  Has patient fallen in last 6 months? Yes. Number of falls 1 fall 01/11/2024 see details above  LIVING ENVIRONMENT: Lives with: lives alone Lives in: House/apartment Stairs: Yes: Internal: 3.5 steps; on left going up Has following equipment at home: Single point cane, Shower bench, and Grab bars  OCCUPATION: Not currently working  PLOF: Independent, Independent with basic  ADLs, Independent with household mobility without device, Independent with community mobility with device, Independent with gait, Independent with transfers, and Leisure: teaches painting classes   PATIENT GOALS: To get rid of straight cane; To be able to walk up the steps normally; to be more mobile    NEXT MD VISIT: This week with PCP  OBJECTIVE:  Note: Objective measures were completed at Evaluation unless otherwise noted.  DIAGNOSTIC FINDINGS: Patient had imaging done on 01/11/2024 after fall. Head CT, CT C-spine, Knee, and shoulder imaging was normal no concerns.  PATIENT SURVEYS:  LEFS: 38/80 47.5  03/15/2024 LEFS 35/80 43.8%   COGNITION: Overall cognitive status: Within functional limits for tasks assessed     SENSATION: WFL   POSTURE: rounded shoulders and forward head    LOWER EXTREMITY ROM: WFL bilateral    LOWER EXTREMITY MMT: Grossly 4/5 bilateral     FUNCTIONAL TESTS:  5 times sit to stand: 13.80 sec ; hands on knees; some pain in right hip Timed up and go (TUG): 12.48 sec no straight cane; 11.40 sec with straight cane  02/02/24 748 ft and RPE 6-7/10  - pressure starts but less than 2/10  03/01/2024 652ft ft no cane (antalgic gait) verbal cues for arm swing, ankle pain started at 2 mins RPE 5/10  03/11/2024 : 725ft no cane; ankle pain after 2 mins; no knee pain during walk (ankles were limiting factor)  03/15/2024 5STS 13.49 sec hands on knees; no pain; better performance than eval TUG: 11.22 sec no cane  GAIT: Assistive device utilized: Single point cane Comments: antalgic gait                                                                                                                                TREATMENT DATE:  03/18/24 (10 min late for appt then needed to go put her sunglasses back in the car and then go to bathroom ) Nustep x 5 min level 3 Discussed current symptoms that are still lingering with concussion Sit to stand x 10 Sit to  stand in staggered stance x 5 each LE (she was able to do right leg much easier than left) Seated hamstring curl x 10 with red tband  Lunge to Bosu x 10 each LE  fwd and lateral Cone taps with back toe on  balance pad in staggered stance x 10 each LE (cone on back counter) Lateral band walks with blue loop at back counter x 2 laps emphasis on correct foot posture to isolate correct muscle group  03/15/2024 Discussion of goals & progress made with physical therapy & new goals for next few weeks LEFS 35/80 43.8% 5STS 13.49 sec hands on knees; no pain; better performance than eval TUG: 11.22 sec no cane Seated ankle PD/DF with rockerboard x 2 mins Standing calf raise on stair 2 x 10 Seated modified deadlift 5# 2 x 10  03/11/2024 NuStep Level 3 - PT present to discuss status 769ft no pain; ankle pain after 2 mins; no knee pain during walk (ankles were limiting factor) Seated ankle inversion/eversion with red TB 2 x 10 bilateral Ecc step down with 4in step on ground 2 x 8 bilateral (max verbal and visual cues. Patient able to decrease UE support on second set)    PATIENT EDUCATION:  Education details: PT eval findings, anticipated POC, progress with PT, and initial HEP Person educated: Patient Education method: Explanation, Demonstration, and Handouts Education comprehension: verbalized understanding, returned demonstration, and needs further education  HOME EXERCISE PROGRAM: Access Code: [email or web address] URL: [email or web address] Date: 03/11/2024 Prepared by: Kristeen Sar  Exercises - Seated Long Arc Quad  - 2 x daily - 7 x weekly - 2 sets - 10 reps - 2 hold - Long Sitting Ankle Pumps  - 1 x daily - 7 x weekly - 2 sets - 10 reps - Seated Heel Raise  - 2 x daily - 7 x weekly - 2 sets - 10 reps - Seated Ankle Alphabet  - 2 x daily - 7 x weekly - 1 sets - 1 reps - Seated March  - 2 x daily - 7 x weekly - 2 sets - 10 reps - Seated Ankle Dorsiflexion AROM  - 2 x daily - 7 x  weekly - 2 sets - 10 reps - Forward Step Up with Counter Support  - 1 x daily - 7 x weekly - 1 sets - 5 reps - Seated Hamstring Stretch  - 2 x daily - 7 x weekly - 1 sets - 3 reps - 20 hold - Seated Figure 4 Piriformis Stretch  - 2 x daily - 7 x weekly - 1 sets - 3 reps - 20 hold - Seated Hip External Rotation AROM  - 2 x daily - 7 x weekly - 1-2 sets - 10 reps - Side Stepping with Resistance at Feet  - 1 x daily - 7 x weekly - 3 sets - 5 reps - Seated Ankle Inversion with Resistance and Legs Crossed  - 1 x daily - 7 x weekly - 2 sets - 10 reps - Long Sitting Ankle Inversion with Anchored Resistance  - 1 x daily - 7 x weekly - 2 sets - 10 reps - Seated Ankle Eversion with Resistance  - 1 x daily - 7 x weekly - 2 sets - 10 reps   Pt Education:  [email or web address]  ASSESSMENT:  CLINICAL IMPRESSION:  Patient was a little late today due to the highway being closed down.  She was able to complete the above tasks with only slight right knee discomfort on the staggered sit to stand.  Patient will benefit from skilled PT to address the below impairments and improve overall function.     Eval: Patient is a 68 y.o. female who was seen today for physical therapy evaluation and treatment for gait  training. Nathanel presents with chronic bilateral foot and ankle that was re aggravated January 11, 2024 after a fall. She fell in her bathroom and she hit her head on the door threshold. She had imaging down and everything came back unremarkable. She does have a concussion and she is limited in her able to bend and pick up items and she gets occasional sharp pains in her head. She denies frequent headaches. She goal is to initial a gym routine and stay mobile. Patient would benefit from some sessions of aquatic therapy to establish an aquatic program to perform on her own. Patient is motivated and wants to get better. Patient will  benefit from skilled PT to address the below impairments and improve overall function.    OBJECTIVE IMPAIRMENTS: Abnormal gait, decreased balance, decreased endurance, difficulty walking, decreased ROM, decreased strength, increased muscle spasms, prosthetic dependency , and pain.   ACTIVITY LIMITATIONS: carrying, lifting, bending, standing, squatting, stairs, transfers, and locomotion level  PARTICIPATION LIMITATIONS: meal prep, cleaning, laundry, interpersonal relationship, shopping, community activity, and church  PERSONAL FACTORS: Age, Fitness, and Time since onset of injury/illness/exacerbation are also affecting patient's functional outcome.   REHAB POTENTIAL: Good  CLINICAL DECISION MAKING: Evolving/moderate complexity  EVALUATION COMPLEXITY: Moderate   GOALS: Goals reviewed with patient? Yes  SHORT TERM GOALS: Target date: 02/17/2024  Patient will be independent with initial HEP. Baseline:  Goal status: MET  2.  Patient will be able to participant a to establish a baseline for functional test. Baseline: will test next visit due to foot pain today (02/16/24) Goal status: MET 03/15/2024   3.  Patient will perform 5STS in < or = to 12 sec for improved functional mobility. Baseline:  Goal status: In progress (13.49 sec) 03/15/2024    LONG TERM GOALS: Target date: 04/23/2024  Patient will demonstrate independence in advanced HEP. Baseline:  Goal status: IN PROGRESS 03/15/2024  2.  Patient will report > or = to 50% improvement in ankle and foot pain since starting PT. Baseline:  Goal status: IN PROGRESS (30-40%) 03/15/2024  3.  Patient will verbalize and demonstrate self-care strategies to manage pain including tissue mobility practices and change of position. Baseline:  Goal status: IN PROGRESS 03/15/2024  4.  Patient will score > or = to 48/80 on LEFS due to improved function. Baseline: 38/80 Goal status: IN PROGRESS (35/80) 03/15/2024  5.  Patient will be  able to stand long enough to wash dishes with < or = to 2/10 back pain. Baseline: has to take rests Goal status: MET 03/15/2024  6.  Patient will demonstrate proper posture when bending and lifting to prevent back injury. Baseline:  Goal status:IN PROGRESS 03/15/2024  7.  Patient will be able to negotiate stairs with a reciprocal pattern with LRAD to improve functional mobility. Baseline:  Goal status:NEW  8.  Patient will ambulate > or = to 886ft with LRAD during for improved community mobility. Baseline:  Goal status:NEW    PLAN:  PT FREQUENCY: 2x/week  PT DURATION: 6 weeks  PLANNED INTERVENTIONS: 97164- PT Re-evaluation, 97110-Therapeutic exercises, 97530- Therapeutic activity, 97112- Neuromuscular re-education, 97535- Self Care, [id number]- Manual therapy, [phone]- Gait training, [phone]- Canalith repositioning, [id number]- Aquatic Therapy, [phone]- Electrical stimulation (unattended), [phone]- Electrical stimulation (manual), [id number]- Vasopneumatic device, [id number]- Ultrasound, [id number]- Ionotophoresis 4mg /ml Dexamethasone , 79439 (1-2 muscles), [id number] (3+ muscles)- Dry Needling, Patient/Family education, Balance training, Stair training, Taping, Joint mobilization, Joint manipulation, Spinal manipulation, Spinal mobilization, Vestibular training, Cryotherapy, and Moist heat  PLAN FOR NEXT SESSION:  airex balance; standing/ walking tolerance; LE strengthening; ankle strengthening & ROM; core strengthening     Anslee Micheletti B. Jabez Molner, PT 03/18/24 4:53 PM [facility] [address], [address] [place], [place] [address] Phone # [phone] Fax [phone]

## 2024-03-23 ENCOUNTER — Encounter (HOSPITAL_BASED_OUTPATIENT_CLINIC_OR_DEPARTMENT_OTHER): Payer: Self-pay | Admitting: Physical Therapy

## 2024-03-23 ENCOUNTER — Ambulatory Visit (HOSPITAL_BASED_OUTPATIENT_CLINIC_OR_DEPARTMENT_OTHER): Attending: Family Medicine | Admitting: Physical Therapy

## 2024-03-23 DIAGNOSIS — M6281 Muscle weakness (generalized): Secondary | ICD-10-CM | POA: Diagnosis present

## 2024-03-23 DIAGNOSIS — G8929 Other chronic pain: Secondary | ICD-10-CM | POA: Diagnosis present

## 2024-03-23 DIAGNOSIS — M5459 Other low back pain: Secondary | ICD-10-CM | POA: Insufficient documentation

## 2024-03-23 DIAGNOSIS — R262 Difficulty in walking, not elsewhere classified: Secondary | ICD-10-CM | POA: Diagnosis present

## 2024-03-23 DIAGNOSIS — M25562 Pain in left knee: Secondary | ICD-10-CM | POA: Diagnosis present

## 2024-03-23 DIAGNOSIS — M25561 Pain in right knee: Secondary | ICD-10-CM | POA: Insufficient documentation

## 2024-03-23 NOTE — Therapy (Signed)
 OUTPATIENT PHYSICAL THERAPY LOWER EXTREMITY TREATMENT/ RE-CERTIFICATION   Patient Name: Chelsea Bell MRN: [id number] DOB:1955/08/23, 68 y.o., female Today's Date: 03/23/2024  END OF SESSION:  PT End of Session - 03/23/24 0938     Visit Number 27    Date for PT Re-Evaluation 04/23/24    Authorization Type BCBS Medicare    Progress Note Due on Visit 30    PT Start Time 0932    PT Stop Time 1010    PT Time Calculation (min) 38 min    Behavior During Therapy WFL for tasks assessed/performed         Past Medical History:  Diagnosis Date   Cancer (HCC)    endometrial cancer- surgery only   Concussion    GERD (gastroesophageal reflux disease)    Past Surgical History:  Procedure Laterality Date   ABDOMINAL HYSTERECTOMY     COLONOSCOPY WITH PROPOFOL  N/A 06/09/2014   Procedure: COLONOSCOPY WITH PROPOFOL ;  Surgeon: Lamar Donnald GAILS, MD;  Location: [facility];  Service: Endoscopy;  Laterality: N/A;   DILATION AND CURETTAGE OF UTERUS     LAPAROSCOPIC OVARIAN     diagnostic with 1 ovary removed , then Hysterectomy(endometrial cancer) with other ovary removed   TONSILLECTOMY     Patient Active Problem List   Diagnosis Date Noted   Overactive bladder 10/29/2023   SUI (stress urinary incontinence, female) 10/29/2023   Chest pain 10/07/2021    PCP: Marvine Rush, MD  REFERRING PROVIDER: Marvine Rush, MD  REFERRING DIAG: Rt 26.7 Gait training  THERAPY DIAG:  Chronic pain of both knees  Muscle weakness (generalized)  Other low back pain  Difficulty in walking, not elsewhere classified  Rationale for Evaluation and Treatment: Rehabilitation  ONSET DATE:  01/11/2024  SUBJECTIVE:   SUBJECTIVE STATEMENT: Patient reports her ankles hurt when she walks - up to 4/10; no pain at rest.  She reports good tolerance during aquatic sessions, but afterwards sometimes she has had increased pain in back and LEs. It's easy to over-do it.  She reports she is still having  concussion symptoms, but not as often and not as severe.  Headache yesterday. Pt reports she would like to work on getting up/off floor in future PT sessions.   POOL ACCESS: (03/23/24) Pt plans to check out [facility] (and the entry options into pool); may consider joining [employer] when allowed to lift weights.   Eval: Patient presents with pain in her feet and ankles. June 22 she had a bad fall. She had stool in her bathroom and she tripped over it and she hit her head on the door frame. She has multiple bruises on her head, knee, and ankle. Patient denies a brain bleed and any broken bones. She had a follow up with the orthopedic doctor and she has multiple appointments coming up. She has a concussion and does not get frequent headaches but she gets occasional twinges and sharp pains. She still get very fatigued and she wants to rest majority of the day. Her left knee is her bad knee. She does not use her straight cane in her house, but she used out in the community [employer], appointments etc). She sometimes uses the scooter when she grocery shopes. Her goal is to not use the straight cane. She feels she is too young to be using a straight cane.   PERTINENT HISTORY: Currently in pelvic therapy; hx cancer, B plantar fasciitis (chronic) PAIN:  Are you having pain?PAIN:  Are you having pain? Yes  NPRS scale: 3-4/10 Pain location:low back, bil ankles  PAIN TYPE: aching and dull Pain description: intermittent and aching  Aggravating factors: over doing it Relieving factors: seated foot massage device, ice bottles, massage gun   PRECAUTIONS: Fall and Other: Concussion   RED FLAGS: None   WEIGHT BEARING RESTRICTIONS: No  FALLS:  Has patient fallen in last 6 months? Yes. Number of falls 1 fall 01/11/2024 see details above  LIVING ENVIRONMENT: Lives with: lives alone Lives in: House/apartment Stairs: Yes: Internal: 3.5 steps; on left going up Has following equipment at home: Single  point cane, Shower bench, and Grab bars  OCCUPATION: Not currently working  PLOF: Independent, Independent with basic ADLs, Independent with household mobility without device, Independent with community mobility with device, Independent with gait, Independent with transfers, and Leisure: teaches painting classes   PATIENT GOALS: To get rid of straight cane; To be able to walk up the steps normally; to be more mobile    NEXT MD VISIT: This week with PCP  OBJECTIVE:  Note: Objective measures were completed at Evaluation unless otherwise noted.  DIAGNOSTIC FINDINGS: Patient had imaging done on 01/11/2024 after fall. Head CT, CT C-spine, Knee, and shoulder imaging was normal no concerns.  PATIENT SURVEYS:  LEFS: 38/80 47.5  03/15/2024 LEFS 35/80 43.8%   COGNITION: Overall cognitive status: Within functional limits for tasks assessed     SENSATION: WFL   POSTURE: rounded shoulders and forward head    LOWER EXTREMITY ROM: WFL bilateral    LOWER EXTREMITY MMT: Grossly 4/5 bilateral     FUNCTIONAL TESTS:  5 times sit to stand: 13.80 sec ; hands on knees; some pain in right hip Timed up and go (TUG): 12.48 sec no straight cane; 11.40 sec with straight cane  02/02/24 748 ft and RPE 6-7/10  - pressure starts but less than 2/10  03/01/2024 648ft ft no cane (antalgic gait) verbal cues for arm swing, ankle pain started at 2 mins RPE 5/10  03/11/2024 : 745ft no cane; ankle pain after 2 mins; no knee pain during walk (ankles were limiting factor)  03/15/2024 5STS 13.49 sec hands on knees; no pain; better performance than eval TUG: 11.22 sec no cane  GAIT: Assistive device utilized: Single point cane Comments: antalgic gait                                                                                                                                TREATMENT DATE:  [facility] Adult PT Treatment:                                             Date: 03/23/24 Pt seen for aquatic  therapy today.  Treatment took place in water 3.5-4.75 ft in depth at the [facility] pool. Temp of water was 91.  Pt  entered/exited the pool via stairs independently with bil rail.  - unsupported walking forward/ backward -> with reciprocal row on top of water with UE on rainbow hand floats - side stepping with arm add/abdct with rainbow -> yellow hand floats - UE on yellow hand floats:  3 way LE kicks (hip extension to toe touch) 2 x 5 each LE, (good balance challenge, cues to not kick LE too high); heel/toe raises x 10; hamstring curls x 10, alternating LEs - vertical squats pushing single yellow hand float under water x 10 - tandem gait forward/ backward, hands resting on top of water - alternating foot taps to 5 step in water (balance challenge) - STS from bench in water, with feet on step x 5, cues for hip hinge, neutral head, forward arm reach, controlled descent  03/18/24 (10 min late for appt then needed to go put her sunglasses back in the car and then go to bathroom ) Nustep x 5 min level 3 Discussed current symptoms that are still lingering with concussion Sit to stand x 10 Sit to stand in staggered stance x 5 each LE (she was able to do right leg much easier than left) Seated hamstring curl x 10 with red tband  Lunge to Bosu x 10 each LE  fwd and lateral Cone taps with back toe on balance pad in staggered stance x 10 each LE (cone on back counter) Lateral band walks with blue loop at back counter x 2 laps emphasis on correct foot posture to isolate correct muscle group  03/15/2024 Discussion of goals & progress made with physical therapy & new goals for next few weeks LEFS 35/80 43.8% 5STS 13.49 sec hands on knees; no pain; better performance than eval TUG: 11.22 sec no cane Seated ankle PD/DF with rockerboard x 2 mins Standing calf raise on stair 2 x 10 Seated modified deadlift 5# 2 x 10  03/11/2024 NuStep Level 3 - PT present to discuss status 721ft no  pain; ankle pain after 2 mins; no knee pain during walk (ankles were limiting factor) Seated ankle inversion/eversion with red TB 2 x 10 bilateral Ecc step down with 4in step on ground 2 x 8 bilateral (max verbal and visual cues. Patient able to decrease UE support on second set)    PATIENT EDUCATION:  Education details: PT eval findings, anticipated POC, progress with PT, and initial HEP Person educated: Patient Education method: Explanation, Demonstration, and Handouts Education comprehension: verbalized understanding, returned demonstration, and needs further education  HOME EXERCISE PROGRAM: Access Code: [email or web address] URL: [email or web address] Date: 03/11/2024 Prepared by: Kristeen Sar  Exercises - Seated Long Arc Quad  - 2 x daily - 7 x weekly - 2 sets - 10 reps - 2 hold - Long Sitting Ankle Pumps  - 1 x daily - 7 x weekly - 2 sets - 10 reps - Seated Heel Raise  - 2 x daily - 7 x weekly - 2 sets - 10 reps - Seated Ankle Alphabet  - 2 x daily - 7 x weekly - 1 sets - 1 reps - Seated March  - 2 x daily - 7 x weekly - 2 sets - 10 reps - Seated Ankle Dorsiflexion AROM  - 2 x daily - 7 x weekly - 2 sets - 10 reps - Forward Step Up with Counter Support  - 1 x daily - 7 x weekly - 1 sets - 5 reps - Seated Hamstring Stretch  - 2 x daily - 7 x  weekly - 1 sets - 3 reps - 20 hold - Seated Figure 4 Piriformis Stretch  - 2 x daily - 7 x weekly - 1 sets - 3 reps - 20 hold - Seated Hip External Rotation AROM  - 2 x daily - 7 x weekly - 1-2 sets - 10 reps - Side Stepping with Resistance at Feet  - 1 x daily - 7 x weekly - 3 sets - 5 reps - Seated Ankle Inversion with Resistance and Legs Crossed  - 1 x daily - 7 x weekly - 2 sets - 10 reps - Long Sitting Ankle Inversion with Anchored Resistance  - 1 x daily - 7 x weekly - 2 sets - 10 reps - Seated Ankle Eversion with Resistance  - 1 x daily - 7 x weekly - 2 sets - 10 reps   Pt Education:   [email or web address]  ASSESSMENT:  CLINICAL IMPRESSION:  Pt completed all aquatic exercises well, without production of pain. Some cues required for form and core engagement. Pt is progressing towards remaining goals.   Patient will benefit from skilled PT to address the below impairments and improve overall function.     Eval: Patient is a 68 y.o. female who was seen today for physical therapy evaluation and treatment for gait training. Nathanel presents with chronic bilateral foot and ankle that was re aggravated January 11, 2024 after a fall. She fell in her bathroom and she hit her head on the door threshold. She had imaging down and everything came back unremarkable. She does have a concussion and she is limited in her able to bend and pick up items and she gets occasional sharp pains in her head. She denies frequent headaches. She goal is to initial a gym routine and stay mobile. Patient would benefit from some sessions of aquatic therapy to establish an aquatic program to perform on her own. Patient is motivated and wants to get better. Patient will benefit from skilled PT to address the below impairments and improve overall function.    OBJECTIVE IMPAIRMENTS: Abnormal gait, decreased balance, decreased endurance, difficulty walking, decreased ROM, decreased strength, increased muscle spasms, prosthetic dependency , and pain.   ACTIVITY LIMITATIONS: carrying, lifting, bending, standing, squatting, stairs, transfers, and locomotion level  PARTICIPATION LIMITATIONS: meal prep, cleaning, laundry, interpersonal relationship, shopping, community activity, and church  PERSONAL FACTORS: Age, Fitness, and Time since onset of injury/illness/exacerbation are also affecting patient's functional outcome.   REHAB POTENTIAL: Good  CLINICAL DECISION MAKING: Evolving/moderate complexity  EVALUATION COMPLEXITY:  Moderate   GOALS: Goals reviewed with patient? Yes  SHORT TERM GOALS: Target date: 02/17/2024  Patient will be independent with initial HEP. Baseline:  Goal status: MET  2.  Patient will be able to participant a to establish a baseline for functional test. Baseline: will test next visit due to foot pain today (02/16/24) Goal status: MET 03/15/2024   3.  Patient will perform 5STS in < or = to 12 sec for improved functional mobility. Baseline:  Goal status: In progress (13.49 sec) 03/15/2024    LONG TERM GOALS: Target date: 04/23/2024  Patient will demonstrate independence in advanced HEP. Baseline:  Goal status: IN PROGRESS 03/15/2024  2.  Patient will report > or = to 50% improvement in ankle and foot pain since starting PT. Baseline:  Goal status: IN PROGRESS (30-40%) 03/15/2024  3.  Patient will verbalize and demonstrate self-care strategies to manage pain including tissue mobility practices and change of position. Baseline:  Goal status:  IN PROGRESS 03/15/2024  4.  Patient will score > or = to 48/80 on LEFS due to improved function. Baseline: 38/80 Goal status: IN PROGRESS (35/80) 03/15/2024  5.  Patient will be able to stand long enough to wash dishes with < or = to 2/10 back pain. Baseline: has to take rests Goal status: MET 03/15/2024  6.  Patient will demonstrate proper posture when bending and lifting to prevent back injury. Baseline:  Goal status:IN PROGRESS 03/15/2024  7.  Patient will be able to negotiate stairs with a reciprocal pattern with LRAD to improve functional mobility. Baseline:  Goal status:NEW  8.  Patient will ambulate > or = to 82ft with LRAD during for improved community mobility. Baseline:  Goal status:NEW    PLAN:  PT FREQUENCY: 2x/week  PT DURATION: 6 weeks  PLANNED INTERVENTIONS: 97164- PT Re-evaluation, 97110-Therapeutic exercises, 97530- Therapeutic activity, 97112- Neuromuscular re-education, 97535- Self Care, [id number]-  Manual therapy, [phone]- Gait training, [phone]- Canalith repositioning, [id number]- Aquatic Therapy, [phone]- Electrical stimulation (unattended), [phone]- Electrical stimulation (manual), [id number]- Vasopneumatic device, [id number]- Ultrasound, [id number]- Ionotophoresis 4mg /ml Dexamethasone , [id number] (1-2 muscles), [id number] (3+ muscles)- Dry Needling, Patient/Family education, Balance training, Stair training, Taping, Joint mobilization, Joint manipulation, Spinal manipulation, Spinal mobilization, Vestibular training, Cryotherapy, and Moist heat  PLAN FOR NEXT SESSION: airex balance; standing/ walking tolerance; LE strengthening; ankle strengthening & ROM; core strengthening   Delon Aquas, PTA 03/23/24 10:31 AM [facility] [address] [place], [place], [address] Phone: [phone]   Fax:  [phone]

## 2024-03-25 ENCOUNTER — Ambulatory Visit: Attending: Family Medicine | Admitting: Physical Therapy

## 2024-03-25 ENCOUNTER — Encounter: Payer: Self-pay | Admitting: Physical Therapy

## 2024-03-25 DIAGNOSIS — M6281 Muscle weakness (generalized): Secondary | ICD-10-CM | POA: Insufficient documentation

## 2024-03-25 DIAGNOSIS — M25561 Pain in right knee: Secondary | ICD-10-CM | POA: Diagnosis present

## 2024-03-25 DIAGNOSIS — M25562 Pain in left knee: Secondary | ICD-10-CM | POA: Insufficient documentation

## 2024-03-25 DIAGNOSIS — G8929 Other chronic pain: Secondary | ICD-10-CM | POA: Insufficient documentation

## 2024-03-25 DIAGNOSIS — M5459 Other low back pain: Secondary | ICD-10-CM | POA: Insufficient documentation

## 2024-03-25 NOTE — Therapy (Signed)
 OUTPATIENT PHYSICAL THERAPY LOWER EXTREMITY TREATMENT   Patient Name: Chelsea Bell MRN: [id number] DOB:[birth year], 68 y.o., female Today's Date: 03/25/2024  END OF SESSION:  PT End of Session - 03/25/24 1131     Visit Number 28    Date for PT Re-Evaluation 04/23/24    Authorization Type BCBS Medicare    Progress Note Due on Visit 30    PT Start Time 1020    PT Stop Time 1102    PT Time Calculation (min) 42 min    Activity Tolerance Patient tolerated treatment well    Behavior During Therapy WFL for tasks assessed/performed          Past Medical History:  Diagnosis Date   Cancer (HCC)    endometrial cancer- surgery only   Concussion    GERD (gastroesophageal reflux disease)    Past Surgical History:  Procedure Laterality Date   ABDOMINAL HYSTERECTOMY     COLONOSCOPY WITH PROPOFOL  N/A 06/09/2014   Procedure: COLONOSCOPY WITH PROPOFOL ;  Surgeon: Lamar Donnald GAILS, MD;  Location: [facility];  Service: Endoscopy;  Laterality: N/A;   DILATION AND CURETTAGE OF UTERUS     LAPAROSCOPIC OVARIAN     diagnostic with 1 ovary removed , then Hysterectomy(endometrial cancer) with other ovary removed   TONSILLECTOMY     Patient Active Problem List   Diagnosis Date Noted   Overactive bladder 10/29/2023   SUI (stress urinary incontinence, female) 10/29/2023   Chest pain 10/07/2021    PCP: Marvine Rush, MD  REFERRING PROVIDER: Marvine Rush, MD  REFERRING DIAG: Rt 26.7 Gait training  THERAPY DIAG:  Chronic pain of both knees  Muscle weakness (generalized)  Other low back pain  Rationale for Evaluation and Treatment: Rehabilitation  ONSET DATE:  01/11/2024  SUBJECTIVE:   SUBJECTIVE STATEMENT: Patient reports she has soreness in her hamstrings today. Pain is 3 /10.  POOL ACCESS: (03/23/24) Pt plans to check out [facility] (and the entry options into pool); may consider joining [employer] when allowed to lift weights.   Eval: Patient presents with pain in her  feet and ankles. June 22 she had a bad fall. She had stool in her bathroom and she tripped over it and she hit her head on the door frame. She has multiple bruises on her head, knee, and ankle. Patient denies a brain bleed and any broken bones. She had a follow up with the orthopedic doctor and she has multiple appointments coming up. She has a concussion and does not get frequent headaches but she gets occasional twinges and sharp pains. She still get very fatigued and she wants to rest majority of the day. Her left knee is her bad knee. She does not use her straight cane in her house, but she used out in the community [employer], appointments etc). She sometimes uses the scooter when she grocery shopes. Her goal is to not use the straight cane. She feels she is too young to be using a straight cane.   PERTINENT HISTORY: Currently in pelvic therapy; hx cancer, B plantar fasciitis (chronic) PAIN:  Are you having pain?PAIN:  Are you having pain? Yes NPRS scale: 3-4/10 Pain location:low back, bil ankles  PAIN TYPE: aching and dull Pain description: intermittent and aching  Aggravating factors: over doing it Relieving factors: seated foot massage device, ice bottles, massage gun   PRECAUTIONS: Fall and Other: Concussion   RED FLAGS: None   WEIGHT BEARING RESTRICTIONS: No  FALLS:  Has patient fallen in last 6  months? Yes. Number of falls 1 fall 01/11/2024 see details above  LIVING ENVIRONMENT: Lives with: lives alone Lives in: House/apartment Stairs: Yes: Internal: 3.5 steps; on left going up Has following equipment at home: Single point cane, Shower bench, and Grab bars  OCCUPATION: Not currently working  PLOF: Independent, Independent with basic ADLs, Independent with household mobility without device, Independent with community mobility with device, Independent with gait, Independent with transfers, and Leisure: teaches painting classes   PATIENT GOALS: To get rid of straight  cane; To be able to walk up the steps normally; to be more mobile    NEXT MD VISIT: This week with PCP  OBJECTIVE:  Note: Objective measures were completed at Evaluation unless otherwise noted.  DIAGNOSTIC FINDINGS: Patient had imaging done on 01/11/2024 after fall. Head CT, CT C-spine, Knee, and shoulder imaging was normal no concerns.  PATIENT SURVEYS:  LEFS: 38/80 47.5  03/15/2024 LEFS 35/80 43.8%   COGNITION: Overall cognitive status: Within functional limits for tasks assessed     SENSATION: WFL   POSTURE: rounded shoulders and forward head    LOWER EXTREMITY ROM: WFL bilateral    LOWER EXTREMITY MMT: Grossly 4/5 bilateral     FUNCTIONAL TESTS:  5 times sit to stand: 13.80 sec ; hands on knees; some pain in right hip Timed up and go (TUG): 12.48 sec no straight cane; 11.40 sec with straight cane  02/02/24 748 ft and RPE 6-7/10  - pressure starts but less than 2/10  03/01/2024 674ft ft no cane (antalgic gait) verbal cues for arm swing, ankle pain started at 2 mins RPE 5/10  03/11/2024 : 752ft no cane; ankle pain after 2 mins; no knee pain during walk (ankles were limiting factor)  03/15/2024 5STS 13.49 sec hands on knees; no pain; better performance than eval TUG: 11.22 sec no cane  GAIT: Assistive device utilized: Single point cane Comments: antalgic gait                                                                                                                                TREATMENT DATE:  [facility] Adult PT Treatment:                                              03/25/24  Nustep x 4 min level 4 Standing hamstring stretch at stair 2x 20sec bilateral  Sit to stand x 10 holding 7# DB Patient verbalized some leakage so cued her to do TA engagement + pelvic floor lift Sit to stand in staggered stance x 6 each LE  Lateral band walks with green TB x 2 laps of 15 ft Forward T x5 each side Step up on airex + knee drive x 10   Date: 0/7/74 Pt seen for  aquatic therapy today.  Treatment took place  in water 3.5-4.75 ft in depth at the [facility] pool. Temp of water was 91.  Pt entered/exited the pool via stairs independently with bil rail.  - unsupported walking forward/ backward -> with reciprocal row on top of water with UE on rainbow hand floats - side stepping with arm add/abdct with rainbow -> yellow hand floats - UE on yellow hand floats:  3 way LE kicks (hip extension to toe touch) 2 x 5 each LE, (good balance challenge, cues to not kick LE too high); heel/toe raises x 10; hamstring curls x 10, alternating LEs - vertical squats pushing single yellow hand float under water x 10 - tandem gait forward/ backward, hands resting on top of water - alternating foot taps to 5 step in water (balance challenge) - STS from bench in water, with feet on step x 5, cues for hip hinge, neutral head, forward arm reach, controlled descent  03/18/24 (10 min late for appt then needed to go put her sunglasses back in the car and then go to bathroom ) Nustep x 5 min level 3 Discussed current symptoms that are still lingering with concussion Sit to stand x 10 Sit to stand in staggered stance x 5 each LE (she was able to do right leg much easier than left) Seated hamstring curl x 10 with red tband  Lunge to Bosu x 10 each LE  fwd and lateral Cone taps with back toe on balance pad in staggered stance x 10 each LE (cone on back counter) Lateral band walks with blue loop at back counter x 2 laps emphasis on correct foot posture to isolate correct muscle group  03/15/2024 Discussion of goals & progress made with physical therapy & new goals for next few weeks LEFS 35/80 43.8% 5STS 13.49 sec hands on knees; no pain; better performance than eval TUG: 11.22 sec no cane Seated ankle PD/DF with rockerboard x 2 mins Standing calf raise on stair 2 x 10 Seated modified deadlift 5# 2 x 10     PATIENT EDUCATION:  Education details: PT eval findings,  anticipated POC, progress with PT, and initial HEP Person educated: Patient Education method: Explanation, Demonstration, and Handouts Education comprehension: verbalized understanding, returned demonstration, and needs further education  HOME EXERCISE PROGRAM: Access Code: [email or web address] URL: [email or web address] Date: 03/25/2024 Prepared by: Kristeen Sar  Exercises - Seated Long Arc Quad  - 2 x daily - 7 x weekly - 2 sets - 10 reps - 2 hold - Long Sitting Ankle Pumps  - 1 x daily - 7 x weekly - 2 sets - 10 reps - Seated Heel Raise  - 2 x daily - 7 x weekly - 2 sets - 10 reps - Seated Ankle Alphabet  - 2 x daily - 7 x weekly - 1 sets - 1 reps - Seated March  - 2 x daily - 7 x weekly - 2 sets - 10 reps - Seated Ankle Dorsiflexion AROM  - 2 x daily - 7 x weekly - 2 sets - 10 reps - Forward Step Up with Counter Support  - 1 x daily - 7 x weekly - 1 sets - 5 reps - Seated Hamstring Stretch  - 2 x daily - 7 x weekly - 1 sets - 3 reps - 20 hold - Seated Figure 4 Piriformis Stretch  - 2 x daily - 7 x weekly - 1 sets - 3 reps - 20 hold - Seated Hip External Rotation AROM  - 2 x daily -  7 x weekly - 1-2 sets - 10 reps - Side Stepping with Resistance at Feet  - 1 x daily - 7 x weekly - 3 sets - 5 reps - Seated Ankle Inversion with Resistance and Legs Crossed  - 1 x daily - 7 x weekly - 2 sets - 10 reps - Long Sitting Ankle Inversion with Anchored Resistance  - 1 x daily - 7 x weekly - 2 sets - 10 reps - Seated Ankle Eversion with Resistance  - 1 x daily - 7 x weekly - 2 sets - 10 reps - Forward T with Counter Support  - 1 x daily - 7 x weekly - 1-2 sets - 5-8 reps   Pt Education:  [email or web address]  ASSESSMENT:  CLINICAL IMPRESSION:  Treatment session focused on single leg balance and stability. Patient verbalize some leakage when performing sit to stand exercise, so provided cues for TA activation  and a pelvic floor lift. After this patient did not have any leakage. Patient was a little apprehensive about forward T exercise but she performed it well. PT monitored patient throughout and provided verbal and visual cues as needed. Patient will benefit from skilled PT to address the below impairments and improve overall function.    Eval: Patient is a 68 y.o. female who was seen today for physical therapy evaluation and treatment for gait training. Nathanel presents with chronic bilateral foot and ankle that was re aggravated January 11, 2024 after a fall. She fell in her bathroom and she hit her head on the door threshold. She had imaging down and everything came back unremarkable. She does have a concussion and she is limited in her able to bend and pick up items and she gets occasional sharp pains in her head. She denies frequent headaches. She goal is to initial a gym routine and stay mobile. Patient would benefit from some sessions of aquatic therapy to establish an aquatic program to perform on her own. Patient is motivated and wants to get better. Patient will benefit from skilled PT to address the below impairments and improve overall function.    OBJECTIVE IMPAIRMENTS: Abnormal gait, decreased balance, decreased endurance, difficulty walking, decreased ROM, decreased strength, increased muscle spasms, prosthetic dependency , and pain.   ACTIVITY LIMITATIONS: carrying, lifting, bending, standing, squatting, stairs, transfers, and locomotion level  PARTICIPATION LIMITATIONS: meal prep, cleaning, laundry, interpersonal relationship, shopping, community activity, and church  PERSONAL FACTORS: Age, Fitness, and Time since onset of injury/illness/exacerbation are also affecting patient's functional outcome.   REHAB POTENTIAL: Good  CLINICAL DECISION MAKING: Evolving/moderate complexity  EVALUATION COMPLEXITY: Moderate   GOALS: Goals reviewed with patient? Yes  SHORT TERM GOALS: Target date:  02/17/2024  Patient will be independent with initial HEP. Baseline:  Goal status: MET  2.  Patient will be able to participant a to establish a baseline for functional test. Baseline: will test next visit due to foot pain today (02/16/24) Goal status: MET 03/15/2024   3.  Patient will perform 5STS in < or = to 12 sec for improved functional mobility. Baseline:  Goal status: In progress (13.49 sec) 03/15/2024    LONG TERM GOALS: Target date: 04/23/2024  Patient will demonstrate independence in advanced HEP. Baseline:  Goal status: IN PROGRESS 03/15/2024  2.  Patient will report > or = to 50% improvement in ankle and foot pain since starting PT. Baseline:  Goal status: IN PROGRESS (30-40%) 03/15/2024  3.  Patient will verbalize and demonstrate self-care strategies to manage pain  including tissue mobility practices and change of position. Baseline:  Goal status: IN PROGRESS 03/15/2024  4.  Patient will score > or = to 48/80 on LEFS due to improved function. Baseline: 38/80 Goal status: IN PROGRESS (35/80) 03/15/2024  5.  Patient will be able to stand long enough to wash dishes with < or = to 2/10 back pain. Baseline: has to take rests Goal status: MET 03/15/2024  6.  Patient will demonstrate proper posture when bending and lifting to prevent back injury. Baseline:  Goal status:IN PROGRESS 03/15/2024  7.  Patient will be able to negotiate stairs with a reciprocal pattern with LRAD to improve functional mobility. Baseline:  Goal status:NEW  8.  Patient will ambulate > or = to 860ft with LRAD during for improved community mobility. Baseline:  Goal status:NEW    PLAN:  PT FREQUENCY: 2x/week  PT DURATION: 6 weeks  PLANNED INTERVENTIONS: [id number]- PT Re-evaluation, 97110-Therapeutic exercises, 97530- Therapeutic activity, 97112- Neuromuscular re-education, 97535- Self Care, [id number]- Manual therapy, [phone]- Gait training, [phone]- Canalith repositioning, [id number]- Aquatic  Therapy, [phone]- Electrical stimulation (unattended), [phone]- Electrical stimulation (manual), [id number]- Vasopneumatic device, [id number]- Ultrasound, [id number]- Ionotophoresis 4mg /ml Dexamethasone , [id number] (1-2 muscles), [id number] (3+ muscles)- Dry Needling, Patient/Family education, Balance training, Stair training, Taping, Joint mobilization, Joint manipulation, Spinal manipulation, Spinal mobilization, Vestibular training, Cryotherapy, and Moist heat  PLAN FOR NEXT SESSION: practice floor transfer;single leg balance; standing/ walking tolerance; LE strengthening; ankle strengthening & ROM; core strengthening   Kristeen Sar, PT 03/25/24 11:32 AM

## 2024-03-30 ENCOUNTER — Ambulatory Visit (HOSPITAL_BASED_OUTPATIENT_CLINIC_OR_DEPARTMENT_OTHER): Payer: Self-pay | Admitting: Physical Therapy

## 2024-04-02 ENCOUNTER — Ambulatory Visit: Admitting: Physical Therapy

## 2024-04-02 ENCOUNTER — Encounter: Payer: Self-pay | Admitting: Physical Therapy

## 2024-04-02 DIAGNOSIS — M25561 Pain in right knee: Secondary | ICD-10-CM | POA: Diagnosis not present

## 2024-04-02 DIAGNOSIS — G8929 Other chronic pain: Secondary | ICD-10-CM

## 2024-04-02 DIAGNOSIS — M6281 Muscle weakness (generalized): Secondary | ICD-10-CM

## 2024-04-02 DIAGNOSIS — M5459 Other low back pain: Secondary | ICD-10-CM

## 2024-04-02 NOTE — Therapy (Signed)
 OUTPATIENT PHYSICAL THERAPY LOWER EXTREMITY TREATMENT  Progress Note Reporting Period 03/04/2024 to 04/02/2024  See note below for Objective Data and Assessment of Progress/Goals.     Patient Name: Chelsea Bell MRN: [id number] DOB:1955/12/03, 68 y.o., female Today's Date: 04/02/2024  END OF SESSION:  PT End of Session - 04/02/24 1025     Visit Number 29    Date for PT Re-Evaluation 04/23/24    Authorization Type BCBS Medicare    Progress Note Due on Visit 39    PT Start Time 0850    PT Stop Time 0931    PT Time Calculation (min) 41 min    Behavior During Therapy [facility] for tasks assessed/performed           Past Medical History:  Diagnosis Date   Cancer (HCC)    endometrial cancer- surgery only   Concussion    GERD (gastroesophageal reflux disease)    Past Surgical History:  Procedure Laterality Date   ABDOMINAL HYSTERECTOMY     COLONOSCOPY WITH PROPOFOL  N/A 06/09/2014   Procedure: COLONOSCOPY WITH PROPOFOL ;  Surgeon: Lamar Donnald GAILS, MD;  Location: [facility];  Service: Endoscopy;  Laterality: N/A;   DILATION AND CURETTAGE OF UTERUS     LAPAROSCOPIC OVARIAN     diagnostic with 1 ovary removed , then Hysterectomy(endometrial cancer) with other ovary removed   TONSILLECTOMY     Patient Active Problem List   Diagnosis Date Noted   Overactive bladder 10/29/2023   SUI (stress urinary incontinence, female) 10/29/2023   Chest pain 10/07/2021    PCP: Marvine Rush, MD  REFERRING PROVIDER: Marvine Rush, MD  REFERRING DIAG: Rt 26.7 Gait training  THERAPY DIAG:  Chronic pain of both knees  Muscle weakness (generalized)  Other low back pain  Rationale for Evaluation and Treatment: Rehabilitation  ONSET DATE:  01/11/2024  SUBJECTIVE:   SUBJECTIVE STATEMENT: Patient reports her ankles are hurting right right now. She was sore after last treatment session.  POOL ACCESS: (03/23/24) Pt plans to check out [facility] (and the entry options into pool);  may consider joining [employer] when allowed to lift weights.   Eval: Patient presents with pain in her feet and ankles. June 22 she had a bad fall. She had stool in her bathroom and she tripped over it and she hit her head on the door frame. She has multiple bruises on her head, knee, and ankle. Patient denies a brain bleed and any broken bones. She had a follow up with the orthopedic doctor and she has multiple appointments coming up. She has a concussion and does not get frequent headaches but she gets occasional twinges and sharp pains. She still get very fatigued and she wants to rest majority of the day. Her left knee is her bad knee. She does not use her straight cane in her house, but she used out in the community [employer], appointments etc). She sometimes uses the scooter when she grocery shopes. Her goal is to not use the straight cane. She feels she is too young to be using a straight cane.   PERTINENT HISTORY: Currently in pelvic therapy; hx cancer, B plantar fasciitis (chronic) PAIN:  Are you having pain?PAIN:  Are you having pain? Yes NPRS scale: 3-4/10 Pain location:low back, bil ankles  PAIN TYPE: aching and dull Pain description: intermittent and aching  Aggravating factors: over doing it Relieving factors: seated foot massage device, ice bottles, massage gun   PRECAUTIONS: Fall and Other: Concussion   RED FLAGS:  None   WEIGHT BEARING RESTRICTIONS: No  FALLS:  Has patient fallen in last 6 months? Yes. Number of falls 1 fall 01/11/2024 see details above  LIVING ENVIRONMENT: Lives with: lives alone Lives in: House/apartment Stairs: Yes: Internal: 3.5 steps; on left going up Has following equipment at home: Single point cane, Shower bench, and Grab bars  OCCUPATION: Not currently working  PLOF: Independent, Independent with basic ADLs, Independent with household mobility without device, Independent with community mobility with device, Independent with gait,  Independent with transfers, and Leisure: teaches painting classes   PATIENT GOALS: To get rid of straight cane; To be able to walk up the steps normally; to be more mobile    NEXT MD VISIT: This week with PCP  OBJECTIVE:  Note: Objective measures were completed at Evaluation unless otherwise noted.  DIAGNOSTIC FINDINGS: Patient had imaging done on 01/11/2024 after fall. Head CT, CT C-spine, Knee, and shoulder imaging was normal no concerns.  PATIENT SURVEYS:  LEFS: 38/80 47.5  03/15/2024 LEFS 35/80 43.8%  04/02/2024 LEFS:50/80 62.5%   COGNITION: Overall cognitive status: Within functional limits for tasks assessed     SENSATION: WFL   POSTURE: rounded shoulders and forward head    LOWER EXTREMITY ROM: WFL bilateral    LOWER EXTREMITY MMT: Grossly 4/5 bilateral     FUNCTIONAL TESTS:  5 times sit to stand: 13.80 sec ; hands on knees; some pain in right hip Timed up and go (TUG): 12.48 sec no straight cane; 11.40 sec with straight cane  02/02/24 748 ft and RPE 6-7/10  - pressure starts but less than 2/10  03/01/2024 635ft ft no cane (antalgic gait) verbal cues for arm swing, ankle pain started at 2 mins RPE 5/10  03/11/2024 : 796ft no cane; ankle pain after 2 mins; no knee pain during walk (ankles were limiting factor)  03/15/2024 5STS 13.49 sec hands on knees; no pain; better performance than eval TUG: 11.22 sec no cane  04/02/2024 5STS 12.90 sec no UE support   GAIT: Assistive device utilized: Single point cane Comments: antalgic gait                                                                                                                                TREATMENT DATE:  [facility] Adult PT Treatment:                                              04/02/24  Nustep x 6 min level 4 5 STS : 12.90 sec no UE support  LEFS: 50/80 62.5% Goal Assessment & progress made with therapy Forward T x5 each side Sit to stand 2 x 5 holding 10# DB  Lunge to BOSU x 10  bilateral (light UE support at barre) Single leg hurdle step overs holding 7# DB x  10 bilateral (UE support at barre)    [facility] Adult PT Treatment:                                              03/25/24  Nustep x 4 min level 4 Standing hamstring stretch at stair 2x 20sec bilateral  Sit to stand x 10 holding 7# DB Patient verbalized some leakage so cued her to do TA engagement + pelvic floor lift Sit to stand in staggered stance x 6 each LE  Lateral band walks with green TB x 2 laps of 15 ft Forward T x5 each side Step up on airex + knee drive x 10   Date: 0/7/[birth year] Pt seen for aquatic therapy today.  Treatment took place in water 3.5-4.75 ft in depth at the [facility] pool. Temp of water was 91.  Pt entered/exited the pool via stairs independently with bil rail.  - unsupported walking forward/ backward -> with reciprocal row on top of water with UE on rainbow hand floats - side stepping with arm add/abdct with rainbow -> yellow hand floats - UE on yellow hand floats:  3 way LE kicks (hip extension to toe touch) 2 x 5 each LE, (good balance challenge, cues to not kick LE too high); heel/toe raises x 10; hamstring curls x 10, alternating LEs - vertical squats pushing single yellow hand float under water x 10 - tandem gait forward/ backward, hands resting on top of water - alternating foot taps to 5 step in water (balance challenge) - STS from bench in water, with feet on step x 5, cues for hip hinge, neutral head, forward arm reach, controlled descent    PATIENT EDUCATION:  Education details: PT eval findings, anticipated POC, progress with PT, and initial HEP Person educated: Patient Education method: Explanation, Demonstration, and Handouts Education comprehension: verbalized understanding, returned demonstration, and needs further education  HOME EXERCISE PROGRAM: Access Code: [email or web address] URL: [email or web address] Date: 03/25/2024 Prepared by: Kristeen Sar  Exercises - Seated Long Arc Quad  - 2 x daily - 7 x weekly - 2 sets - 10 reps - 2 hold - Long Sitting Ankle Pumps  - 1 x daily - 7 x weekly - 2 sets - 10 reps - Seated Heel Raise  - 2 x daily - 7 x weekly - 2 sets - 10 reps - Seated Ankle Alphabet  - 2 x daily - 7 x weekly - 1 sets - 1 reps - Seated March  - 2 x daily - 7 x weekly - 2 sets - 10 reps - Seated Ankle Dorsiflexion AROM  - 2 x daily - 7 x weekly - 2 sets - 10 reps - Forward Step Up with Counter Support  - 1 x daily - 7 x weekly - 1 sets - 5 reps - Seated Hamstring Stretch  - 2 x daily - 7 x weekly - 1 sets - 3 reps - 20 hold - Seated Figure 4 Piriformis Stretch  - 2 x daily - 7 x weekly - 1 sets - 3 reps - 20 hold - Seated Hip External Rotation AROM  - 2 x daily - 7 x weekly - 1-2 sets - 10 reps - Side Stepping with Resistance at Feet  - 1 x daily - 7 x weekly - 3 sets - 5 reps - Seated Ankle Inversion with Resistance and Legs  Crossed  - 1 x daily - 7 x weekly - 2 sets - 10 reps - Long Sitting Ankle Inversion with Anchored Resistance  - 1 x daily - 7 x weekly - 2 sets - 10 reps - Seated Ankle Eversion with Resistance  - 1 x daily - 7 x weekly - 2 sets - 10 reps - Forward T with Counter Support  - 1 x daily - 7 x weekly - 1-2 sets - 5-8 reps   Pt Education:  [email or web address]  ASSESSMENT:  CLINICAL IMPRESSION:  30th visit PN completed today. Patient verbalized feeling 40% better since starting skilled therapy. She is still challenged my ascending steps, walking down a decline, and walking for > 10-15 mins. When she is grocery shopping if there is not a cart available she can almost not walk by the end of the grocery trip. She has had increased ankle pain recently and that has been limiting her walking tolerance. Noted improved times in her 5STS time;  she was able to perform with no UE support. She is progressing well with skilled therapy  and is on track to meet goals by end of POC.    Eval: Patient is a 68 y.o. female who was seen today for physical therapy evaluation and treatment for gait training. Nathanel presents with chronic bilateral foot and ankle that was re aggravated January 11, 2024 after a fall. She fell in her bathroom and she hit her head on the door threshold. She had imaging down and everything came back unremarkable. She does have a concussion and she is limited in her able to bend and pick up items and she gets occasional sharp pains in her head. She denies frequent headaches. She goal is to initial a gym routine and stay mobile. Patient would benefit from some sessions of aquatic therapy to establish an aquatic program to perform on her own. Patient is motivated and wants to get better. Patient will benefit from skilled PT to address the below impairments and improve overall function.    OBJECTIVE IMPAIRMENTS: Abnormal gait, decreased balance, decreased endurance, difficulty walking, decreased ROM, decreased strength, increased muscle spasms, prosthetic dependency , and pain.   ACTIVITY LIMITATIONS: carrying, lifting, bending, standing, squatting, stairs, transfers, and locomotion level  PARTICIPATION LIMITATIONS: meal prep, cleaning, laundry, interpersonal relationship, shopping, community activity, and church  PERSONAL FACTORS: Age, Fitness, and Time since onset of injury/illness/exacerbation are also affecting patient's functional outcome.   REHAB POTENTIAL: Good  CLINICAL DECISION MAKING: Evolving/moderate complexity  EVALUATION COMPLEXITY: Moderate   GOALS: Goals reviewed with patient? Yes  SHORT TERM GOALS: Target date: 02/17/2024  Patient will be independent with initial HEP. Baseline:  Goal status: MET  2.  Patient will be able to participant a to establish a baseline for functional test. Baseline: will test next visit due to foot pain today (02/16/24) Goal status: MET 03/15/2024   3.   Patient will perform 5STS in < or = to 12 sec for improved functional mobility. Baseline:  Goal status: MET 04/02/2024   LONG TERM GOALS: Target date: 04/23/2024  Patient will demonstrate independence in advanced HEP. Baseline:  Goal status: IN PROGRESS 04/02/2024  2.  Patient will report > or = to 50% improvement in ankle and foot pain since starting PT. Baseline:  Goal status: IN PROGRESS (40%) 04/02/2024  3.  Patient will verbalize and demonstrate self-care strategies to manage pain including tissue mobility practices and change of position. Baseline:  Goal status: IN PROGRESS 04/02/2024  4.  Patient will score > or = to 48/80 on LEFS due to improved function. Baseline: 38/80 Goal status: MET  (50/80) 04/02/2024  5.  Patient will be able to stand long enough to wash dishes with < or = to 2/10 back pain. Baseline: has to take rests Goal status: MET 03/15/2024  6.  Patient will demonstrate proper posture when bending and lifting to prevent back injury. Baseline:  Goal status:IN PROGRESS 04/02/2024  7.  Patient will be able to negotiate stairs with a reciprocal pattern with LRAD to improve functional mobility. Baseline:  Goal status:IN PROGRESS 04/02/2024  8.  Patient will ambulate > or = to 863ft with LRAD during for improved community mobility. Baseline:  Goal status:IN PROGRESS 04/02/2024    PLAN:  PT FREQUENCY: 2x/week  PT DURATION: 6 weeks  PLANNED INTERVENTIONS: 97164- PT Re-evaluation, 97110-Therapeutic exercises, 97530- Therapeutic activity, 97112- Neuromuscular re-education, 97535- Self Care, 02859- Manual therapy, [phone]- Gait training, [phone]- Canalith repositioning, [id number]- Aquatic Therapy, [phone]- Electrical stimulation (unattended), [phone]- Electrical stimulation (manual), [id number]- Vasopneumatic device, [id number]- Ultrasound, [id number]- Ionotophoresis 4mg /ml Dexamethasone , 79439 (1-2 muscles), [id number] (3+ muscles)- Dry Needling, Patient/Family education, Balance training, Stair  training, Taping, Joint mobilization, Joint manipulation, Spinal manipulation, Spinal mobilization, Vestibular training, Cryotherapy, and Moist heat  PLAN FOR NEXT SESSION: practice floor transfer;single leg balance; standing/ walking tolerance; LE strengthening; ankle strengthening & ROM; core strengthening   Kristeen Sar, PT 04/02/24 10:27 AM

## 2024-04-05 ENCOUNTER — Ambulatory Visit: Admission: RE | Admit: 2024-04-05 | Discharge: 2024-04-05 | Disposition: A | Discharge status: Home / Self Care

## 2024-04-05 VITALS — BP 139/84 | HR 83 | Temp 98.1°F | Resp 18

## 2024-04-05 DIAGNOSIS — R062 Wheezing: Secondary | ICD-10-CM

## 2024-04-05 DIAGNOSIS — J3089 Other allergic rhinitis: Secondary | ICD-10-CM

## 2024-04-05 DIAGNOSIS — J0100 Acute maxillary sinusitis, unspecified: Secondary | ICD-10-CM | POA: Diagnosis not present

## 2024-04-05 MED ORDER — DOXYCYCLINE HYCLATE 100 MG PO CAPS: 100.0000 mg | ORAL_CAPSULE | Freq: Two times a day (BID) | ORAL | 0 refills | Status: DC

## 2024-04-05 MED ORDER — ALBUTEROL SULFATE HFA 108 (90 BASE) MCG/ACT IN AERS: 2.0000 | INHALATION_SPRAY | RESPIRATORY_TRACT | 0 refills | Status: DC | PRN

## 2024-04-05 MED ORDER — PROMETHAZINE-DM 6.25-15 MG/5ML PO SYRP: 5.0000 mL | ORAL_SOLUTION | Freq: Four times a day (QID) | ORAL | 0 refills | Status: DC | PRN

## 2024-04-05 MED ORDER — DEXAMETHASONE SODIUM PHOSPHATE 10 MG/ML IJ SOLN
10.0000 mg | Freq: Once | INTRAMUSCULAR | Status: AC
Start: 2024-04-05 — End: 2024-04-05
  Administered 2024-04-05: 10 mg via INTRAMUSCULAR

## 2024-04-05 NOTE — ED Triage Notes (Signed)
 Pt reports being at restaurant with friends in late August, being in contact with cologne or flower when s/s started. Pt reports productive cough and sinus drainage/congestion. Pt denies SOB and fevers.  Pt reports being seen at PCP and taking multiple otc medication. Pt reports getting steroid shot at PCP office with no relief.

## 2024-04-05 NOTE — ED Provider Notes (Signed)
 [facility]    CSN: [id number] Arrival date & time: 04/05/24  0859      History   Chief Complaint Chief Complaint  Patient presents with   Allergic Reaction    Dealing with allergies since 8/26 - Entered by patient    HPI Chelsea Bell is a 68 y.o. female.   Patient presenting today with about 3 weeks of ongoing and worsening nasal congestion, sinus pain and pressure, dry hacking cough sometimes productive of thick mucus, occasional wheezing.  Was seen several days after onset of symptoms and given a steroid injection as this was believed to be an allergy exacerbation.  She states this did help temporarily but symptoms have persisted and again worsened.  Denies chest pain, shortness of breath, abdominal pain, vomiting, diarrhea.  Has been taking Xyzal, Singulair, Flonase , Mucinex and using her Navage with minimal relief.    Past Medical History:  Diagnosis Date   Cancer [facility])    endometrial cancer- surgery only   Concussion    GERD (gastroesophageal reflux disease)     Patient Active Problem List   Diagnosis Date Noted   Overactive bladder 10/29/2023   SUI (stress urinary incontinence, female) 10/29/2023   Chest pain 10/07/2021    Past Surgical History:  Procedure Laterality Date   ABDOMINAL HYSTERECTOMY     COLONOSCOPY WITH PROPOFOL  N/A 06/09/2014   Procedure: COLONOSCOPY WITH PROPOFOL ;  Surgeon: Lamar Donnald GAILS, MD;  Location: [facility];  Service: Endoscopy;  Laterality: N/A;   DILATION AND CURETTAGE OF UTERUS     LAPAROSCOPIC OVARIAN     diagnostic with 1 ovary removed , then Hysterectomy(endometrial cancer) with other ovary removed   TONSILLECTOMY      OB History     Gravida  0   Para  0   Term  0   Preterm  0   AB  0   Living  0      SAB  0   IAB  0   Ectopic  0   Multiple  0   Live Births  0            Home Medications    Prior to Admission medications   Medication Sig Start Date End Date Taking?  Authorizing Provider  albuterol  (VENTOLIN  HFA) 108 (90 Base) MCG/ACT inhaler Inhale 2 puffs into the lungs every 4 (four) hours as needed. 04/05/24  Yes Stuart Vernell Norris, PA-C  celecoxib (CELEBREX) 200 MG capsule Take 200 mg by mouth 2 (two) times daily as needed. 03/18/24  Yes [provider]  doxycycline  (VIBRAMYCIN ) 100 MG capsule Take 1 capsule (100 mg total) by mouth 2 (two) times daily. 04/05/24  Yes Stuart Vernell Norris, PA-C  montelukast (SINGULAIR) 10 MG tablet Take 10 mg by mouth daily. 03/29/24  Yes [provider]  promethazine -dextromethorphan (PROMETHAZINE -DM) 6.25-15 MG/5ML syrup Take 5 mLs by mouth 4 (four) times daily as needed. 04/05/24  Yes Stuart Vernell Norris, PA-C  fluorometholone (FML) 0.1 % ophthalmic suspension INSTILL 1 DROP INTO EACH EYE 4 TIMES DAILY FOR 2 WEEKS 02/12/24   [provider]  fluticasone  (FLONASE ) 50 MCG/ACT nasal spray Place 1 spray into both nostrils 2 (two) times daily. 02/28/23   Stuart Vernell Norris, PA-C  mirabegron  ER (MYRBETRIQ ) 50 MG TB24 tablet Take 1 tablet (50 mg total) by mouth daily. 02/18/24   [name], MD  MYRBETRIQ  50 MG TB24 tablet Take 50 mg by mouth daily.    [provider]  OVER THE COUNTER MEDICATION Take 1 each by mouth every morning. Onguard oil (doterra brand) --puts a drop on tongue    [provider]  OVER THE COUNTER MEDICATION Take 1 drop by mouth every morning. [place] (doterra Brand) --one drop on tongue.    [provider]  pantoprazole (PROTONIX) 40 MG tablet Take 40 mg by mouth daily.    [provider]  Saline (AYR SALINE NASAL DROPS) 0.65 % SOLN Place 2 sprays into the nose.    [provider]    Family History Family History  Problem Relation Age of Onset   Heart failure Mother    Hyperlipidemia Mother    Diabetes Mother    Hypertension Mother    Thyroid  disease Mother    Osteoporosis Mother    Arthritis Mother    Cancer  Father        Ear   Cerebrovascular Accident Father    Melanoma Father    Parkinson's disease Father    Stroke Father    Neuropathy Father    Hyperlipidemia Sister    Asthma Sister    Hypertension Sister    Irritable bowel syndrome Sister    Diabetes Maternal Grandmother    Arthritis Maternal Grandmother    Osteoporosis Maternal Grandmother    Heart disease Paternal Grandfather    Breast cancer Paternal Uncle    Bladder Cancer Neg Hx    Renal cancer Neg Hx    Uterine cancer Neg Hx     Social History Social History   Tobacco Use   Smoking status: Never   Smokeless tobacco: Never  Vaping Use   Vaping status: Never Used  Substance Use Topics   Alcohol use: No   Drug use: No     Allergies   Dilaudid [hydromorphone hcl], Oxycodone-acetaminophen , Percocet [oxycodone-acetaminophen ], Amoxicillin, Codeine, and Nickel   Review of Systems Review of Systems Per HPI  Physical Exam Triage Vital Signs ED Triage Vitals [04/05/24 0910]  Encounter Vitals Group     BP 139/84     Girls Systolic BP Percentile      Girls Diastolic BP Percentile      Boys Systolic BP Percentile      Boys Diastolic BP Percentile      Pulse Rate 83     Resp 18     Temp 98.1 F (36.7 C)     Temp Source Oral     SpO2 97 %     Weight      Height      Head Circumference      Peak Flow      Pain Score 0     Pain Loc      Pain Education      Exclude from Growth Chart    No data found.  Updated Vital Signs BP 139/84 (BP Location: Right Arm)   Pulse 83   Temp 98.1 F (36.7 C) (Oral)   Resp 18   SpO2 97%   Visual Acuity Right Eye Distance:   Left Eye Distance:   Bilateral Distance:    Right Eye Near:   Left Eye Near:    Bilateral Near:     Physical Exam Vitals and nursing note reviewed.  Constitutional:      Appearance: Normal appearance.  HENT:     Head: Atraumatic.     Right Ear: Tympanic membrane and external ear normal.     Left Ear: Tympanic membrane and external ear  normal.  Nose: Congestion present.     Mouth/Throat:     Mouth: Mucous membranes are moist.     Pharynx: Posterior oropharyngeal erythema present.  Eyes:     Extraocular Movements: Extraocular movements intact.     Conjunctiva/sclera: Conjunctivae normal.  Cardiovascular:     Rate and Rhythm: Normal rate and regular rhythm.     Heart sounds: Normal heart sounds.  Pulmonary:     Effort: Pulmonary effort is normal.     Breath sounds: Normal breath sounds. No wheezing or rales.  Musculoskeletal:        General: Normal range of motion.     Cervical back: Normal range of motion and neck supple.  Skin:    General: Skin is warm and dry.  Neurological:     Mental Status: She is alert and oriented to person, place, and time.  Psychiatric:        Mood and Affect: Mood normal.        Thought Content: Thought content normal.      UC Treatments / Results  Labs (all labs ordered are listed, but only abnormal results are displayed) Labs Reviewed - No data to display  EKG   Radiology No results found.  Procedures Procedures (including critical care time)  Medications Ordered in UC Medications  dexamethasone  (DECADRON ) injection 10 mg (10 mg Intramuscular Given 04/05/24 0949)    Initial Impression / Assessment and Plan / UC Course  I have reviewed the triage vital signs and the nursing notes.  Pertinent labs & imaging results that were available during my care of the patient were reviewed by me and considered in my medical decision making (see chart for details).     Suspect seasonal allergy exacerbation causing sinusitis given duration and worsening course of symptoms.  She also complains of some occasional wheezes and states that her albuterol  inhaler is out of date, request refill.  Will treat with IM Decadron , doxycycline , albuterol , continued allergy regimen.  Return for worsening symptoms.  Final Clinical Impressions(s) / UC Diagnoses   Final diagnoses:  Acute  maxillary sinusitis, recurrence not specified  Seasonal allergic rhinitis due to other allergic trigger  Wheezing     Discharge Instructions      In addition to the prescribed medications, continue your allergy regimen, nasal spray, Navage.  Follow-up for worsening or unresolving symptoms.   ED Prescriptions     Medication Sig Dispense Auth. Provider   doxycycline  (VIBRAMYCIN ) 100 MG capsule Take 1 capsule (100 mg total) by mouth 2 (two) times daily. 20 capsule Stuart Vernell Norris, PA-C   albuterol  (VENTOLIN  HFA) 108 (90 Base) MCG/ACT inhaler Inhale 2 puffs into the lungs every 4 (four) hours as needed. 18 g Stuart Vernell Norris, PA-C   promethazine -dextromethorphan (PROMETHAZINE -DM) 6.25-15 MG/5ML syrup Take 5 mLs by mouth 4 (four) times daily as needed. 100 mL Stuart Vernell Norris, [place]      PDMP not reviewed this encounter.   Stuart Vernell Norris, [place] 04/05/24 1140

## 2024-04-05 NOTE — Discharge Instructions (Signed)
 In addition to the prescribed medications, continue your allergy regimen, nasal spray, Navage.  Follow-up for worsening or unresolving symptoms.

## 2024-04-06 ENCOUNTER — Ambulatory Visit (HOSPITAL_BASED_OUTPATIENT_CLINIC_OR_DEPARTMENT_OTHER): Admitting: Physical Therapy

## 2024-04-08 ENCOUNTER — Encounter: Payer: Self-pay | Admitting: Physical Therapy

## 2024-04-08 ENCOUNTER — Ambulatory Visit: Admitting: Physical Therapy

## 2024-04-08 DIAGNOSIS — G8929 Other chronic pain: Secondary | ICD-10-CM

## 2024-04-08 DIAGNOSIS — M6281 Muscle weakness (generalized): Secondary | ICD-10-CM

## 2024-04-08 DIAGNOSIS — M5459 Other low back pain: Secondary | ICD-10-CM

## 2024-04-08 DIAGNOSIS — M25561 Pain in right knee: Secondary | ICD-10-CM | POA: Diagnosis not present

## 2024-04-08 NOTE — Therapy (Signed)
 OUTPATIENT PHYSICAL THERAPY LOWER EXTREMITY TREATMENT    Patient Name: Chelsea Bell MRN: [id number] DOB:[birth year], 68 y.o., female Today's Date: 04/08/2024  END OF SESSION:  PT End of Session - 04/08/24 0930     Visit Number 30    Date for PT Re-Evaluation 04/23/24    Authorization Type BCBS Medicare    Progress Note Due on Visit 39    PT Start Time 0848    PT Stop Time 0930    PT Time Calculation (min) 42 min    Activity Tolerance Patient tolerated treatment well    Behavior During Therapy WFL for tasks assessed/performed            Past Medical History:  Diagnosis Date   Cancer (HCC)    endometrial cancer- surgery only   Concussion    GERD (gastroesophageal reflux disease)    Past Surgical History:  Procedure Laterality Date   ABDOMINAL HYSTERECTOMY     COLONOSCOPY WITH PROPOFOL  N/A 06/09/2014   Procedure: COLONOSCOPY WITH PROPOFOL ;  Surgeon: Lamar Donnald GAILS, MD;  Location: [facility];  Service: Endoscopy;  Laterality: N/A;   DILATION AND CURETTAGE OF UTERUS     LAPAROSCOPIC OVARIAN     diagnostic with 1 ovary removed , then Hysterectomy(endometrial cancer) with other ovary removed   TONSILLECTOMY     Patient Active Problem List   Diagnosis Date Noted   Overactive bladder 10/29/2023   SUI (stress urinary incontinence, female) 10/29/2023   Chest pain 10/07/2021    PCP: Marvine Rush, MD  REFERRING PROVIDER: Marvine Rush, MD  REFERRING DIAG: Rt 26.7 Gait training  THERAPY DIAG:  Chronic pain of both knees  Muscle weakness (generalized)  Other low back pain  Rationale for Evaluation and Treatment: Rehabilitation  ONSET DATE:  01/11/2024  SUBJECTIVE:   SUBJECTIVE STATEMENT: Patient presents with 4-5/10 pain today in her back and ankles. She cleaned her tomato plants.   POOL ACCESS: (03/23/24) Pt plans to check out [facility] (and the entry options into pool); may consider joining [employer] when allowed to lift weights.   Eval:  Patient presents with pain in her feet and ankles. June 22 she had a bad fall. She had stool in her bathroom and she tripped over it and she hit her head on the door frame. She has multiple bruises on her head, knee, and ankle. Patient denies a brain bleed and any broken bones. She had a follow up with the orthopedic doctor and she has multiple appointments coming up. She has a concussion and does not get frequent headaches but she gets occasional twinges and sharp pains. She still get very fatigued and she wants to rest majority of the day. Her left knee is her bad knee. She does not use her straight cane in her house, but she used out in the community [employer], appointments etc). She sometimes uses the scooter when she grocery shopes. Her goal is to not use the straight cane. She feels she is too young to be using a straight cane.   PERTINENT HISTORY: Currently in pelvic therapy; hx cancer, B plantar fasciitis (chronic) PAIN:  Are you having pain?PAIN:  Are you having pain? Yes NPRS scale: 3-4/10 Pain location:low back, bil ankles  PAIN TYPE: aching and dull Pain description: intermittent and aching  Aggravating factors: over doing it Relieving factors: seated foot massage device, ice bottles, massage gun   PRECAUTIONS: Fall and Other: Concussion   RED FLAGS: None   WEIGHT BEARING RESTRICTIONS: No  FALLS:  Has patient fallen in last 6 months? Yes. Number of falls 1 fall 01/11/2024 see details above  LIVING ENVIRONMENT: Lives with: lives alone Lives in: House/apartment Stairs: Yes: Internal: 3.5 steps; on left going up Has following equipment at home: Single point cane, Shower bench, and Grab bars  OCCUPATION: Not currently working  PLOF: Independent, Independent with basic ADLs, Independent with household mobility without device, Independent with community mobility with device, Independent with gait, Independent with transfers, and Leisure: teaches painting classes    PATIENT GOALS: To get rid of straight cane; To be able to walk up the steps normally; to be more mobile    NEXT MD VISIT: This week with PCP  OBJECTIVE:  Note: Objective measures were completed at Evaluation unless otherwise noted.  DIAGNOSTIC FINDINGS: Patient had imaging done on 01/11/2024 after fall. Head CT, CT C-spine, Knee, and shoulder imaging was normal no concerns.  PATIENT SURVEYS:  LEFS: 38/80 47.5  03/15/2024 LEFS 35/80 43.8%  04/02/2024 LEFS:50/80 62.5%   COGNITION: Overall cognitive status: Within functional limits for tasks assessed     SENSATION: WFL   POSTURE: rounded shoulders and forward head    LOWER EXTREMITY ROM: WFL bilateral    LOWER EXTREMITY MMT: Grossly 4/5 bilateral     FUNCTIONAL TESTS:  5 times sit to stand: 13.80 sec ; hands on knees; some pain in right hip Timed up and go (TUG): 12.48 sec no straight cane; 11.40 sec with straight cane  02/02/24 748 ft and RPE 6-7/10  - pressure starts but less than 2/10  03/01/2024 649ft ft no cane (antalgic gait) verbal cues for arm swing, ankle pain started at 2 mins RPE 5/10  03/11/2024 : 734ft no cane; ankle pain after 2 mins; no knee pain during walk (ankles were limiting factor)  03/15/2024 5STS 13.49 sec hands on knees; no pain; better performance than eval TUG: 11.22 sec no cane  04/02/2024 5STS 12.90 sec no UE support   GAIT: Assistive device utilized: Single point cane Comments: antalgic gait                                                                                                                                TREATMENT DATE:  [facility] Adult PT Treatment:                                              04/08/24  Nustep x 7 min level 3 Seated hamstring stretch 3 x 20 sec Seated QL stretch with peanut ball 4 x 15 sec TA activation + pelvic floor lift x 12 Seated rockerboard ankle (PF/DF) x 1.5 mins Standing hip abduction and extension with yellow loop around ankles x 8  bilateral  Standing chest press 4# DB 2 x 10  Forward T x 7 bilateral  04/02/24  Nustep x 6 min level 4 5 STS : 12.90 sec no UE support  LEFS: 50/80 62.5% Goal Assessment & progress made with therapy Forward T x5 each side Sit to stand 2 x 5 holding 10# DB  Lunge to BOSU x 10 bilateral (light UE support at barre) Single leg hurdle step overs holding 7# DB x 10 bilateral (UE support at barre)    [facility] Adult PT Treatment:                                              03/25/24  Nustep x 4 min level 4 Standing hamstring stretch at stair 2x 20sec bilateral  Sit to stand x 10 holding 7# DB Patient verbalized some leakage so cued her to do TA engagement + pelvic floor lift Sit to stand in staggered stance x 6 each LE  Lateral band walks with green TB x 2 laps of 15 ft Forward T x5 each side Step up on airex + knee drive x 10   Date: 0/7/74 Pt seen for aquatic therapy today.  Treatment took place in water 3.5-4.75 ft in depth at the [facility] pool. Temp of water was 91.  Pt entered/exited the pool via stairs independently with bil rail.  - unsupported walking forward/ backward -> with reciprocal row on top of water with UE on rainbow hand floats - side stepping with arm add/abdct with rainbow -> yellow hand floats - UE on yellow hand floats:  3 way LE kicks (hip extension to toe touch) 2 x 5 each LE, (good balance challenge, cues to not kick LE too high); heel/toe raises x 10; hamstring curls x 10, alternating LEs - vertical squats pushing single yellow hand float under water x 10 - tandem gait forward/ backward, hands resting on top of water - alternating foot taps to 5 step in water (balance challenge) - STS from bench in water, with feet on step x 5, cues for hip hinge, neutral head, forward arm reach, controlled descent    PATIENT EDUCATION:  Education details: PT eval findings, anticipated POC, progress with PT, and initial HEP Person educated: Patient Education  method: Explanation, Demonstration, and Handouts Education comprehension: verbalized understanding, returned demonstration, and needs further education  HOME EXERCISE PROGRAM: Access Code: [email or web address] URL: [email or web address] Date: 03/25/2024 Prepared by: Kristeen Sar  Exercises - Seated Long Arc Quad  - 2 x daily - 7 x weekly - 2 sets - 10 reps - 2 hold - Long Sitting Ankle Pumps  - 1 x daily - 7 x weekly - 2 sets - 10 reps - Seated Heel Raise  - 2 x daily - 7 x weekly - 2 sets - 10 reps - Seated Ankle Alphabet  - 2 x daily - 7 x weekly - 1 sets - 1 reps - Seated March  - 2 x daily - 7 x weekly - 2 sets - 10 reps - Seated Ankle Dorsiflexion AROM  - 2 x daily - 7 x weekly - 2 sets - 10 reps - Forward Step Up with Counter Support  - 1 x daily - 7 x weekly - 1 sets - 5 reps - Seated Hamstring Stretch  - 2 x daily - 7 x weekly - 1 sets - 3 reps - 20 hold - Seated Figure 4 Piriformis Stretch  - 2 x daily - 7  x weekly - 1 sets - 3 reps - 20 hold - Seated Hip External Rotation AROM  - 2 x daily - 7 x weekly - 1-2 sets - 10 reps - Side Stepping with Resistance at Feet  - 1 x daily - 7 x weekly - 3 sets - 5 reps - Seated Ankle Inversion with Resistance and Legs Crossed  - 1 x daily - 7 x weekly - 2 sets - 10 reps - Long Sitting Ankle Inversion with Anchored Resistance  - 1 x daily - 7 x weekly - 2 sets - 10 reps - Seated Ankle Eversion with Resistance  - 1 x daily - 7 x weekly - 2 sets - 10 reps - Forward T with Counter Support  - 1 x daily - 7 x weekly - 1-2 sets - 5-8 reps   Pt Education:  [email or web address]  ASSESSMENT:  CLINICAL IMPRESSION:  Nathanel presents with increased low back and ankle pain today. She attributes this to cleaning the tomato plant yesterday. Incorporated more hip and lumbar flexibility exercises for improved mobility. Patient still has her cough due to her allergies. She  went to the walk-in clinic this week and they prescribed her steroids and an antihistamine. Overall, she tolerated treatment session well and did not verbalize any increased pain or discomfort. PT wanted to practice floor transfers today, but due to patient's pain opted to perform next treatment session. Patient will benefit from skilled PT to address the below impairments and improve overall function.     Eval: Patient is a 68 y.o. female who was seen today for physical therapy evaluation and treatment for gait training. Nathanel presents with chronic bilateral foot and ankle that was re aggravated January 11, 2024 after a fall. She fell in her bathroom and she hit her head on the door threshold. She had imaging down and everything came back unremarkable. She does have a concussion and she is limited in her able to bend and pick up items and she gets occasional sharp pains in her head. She denies frequent headaches. She goal is to initial a gym routine and stay mobile. Patient would benefit from some sessions of aquatic therapy to establish an aquatic program to perform on her own. Patient is motivated and wants to get better. Patient will benefit from skilled PT to address the below impairments and improve overall function.    OBJECTIVE IMPAIRMENTS: Abnormal gait, decreased balance, decreased endurance, difficulty walking, decreased ROM, decreased strength, increased muscle spasms, prosthetic dependency , and pain.   ACTIVITY LIMITATIONS: carrying, lifting, bending, standing, squatting, stairs, transfers, and locomotion level  PARTICIPATION LIMITATIONS: meal prep, cleaning, laundry, interpersonal relationship, shopping, community activity, and church  PERSONAL FACTORS: Age, Fitness, and Time since onset of injury/illness/exacerbation are also affecting patient's functional outcome.   REHAB POTENTIAL: Good  CLINICAL DECISION MAKING: Evolving/moderate complexity  EVALUATION COMPLEXITY:  Moderate   GOALS: Goals reviewed with patient? Yes  SHORT TERM GOALS: Target date: 02/17/2024  Patient will be independent with initial HEP. Baseline:  Goal status: MET  2.  Patient will be able to participant a to establish a baseline for functional test. Baseline: will test next visit due to foot pain today (02/16/24) Goal status: MET 03/15/2024   3.  Patient will perform 5STS in < or = to 12 sec for improved functional mobility. Baseline:  Goal status: MET 04/02/2024   LONG TERM GOALS: Target date: 04/23/2024  Patient will demonstrate independence in advanced HEP. Baseline:  Goal status: IN  PROGRESS 04/02/2024  2.  Patient will report > or = to 50% improvement in ankle and foot pain since starting PT. Baseline:  Goal status: IN PROGRESS (40%) 04/02/2024  3.  Patient will verbalize and demonstrate self-care strategies to manage pain including tissue mobility practices and change of position. Baseline:  Goal status: IN PROGRESS 04/02/2024  4.  Patient will score > or = to 48/80 on LEFS due to improved function. Baseline: 38/80 Goal status: MET  (50/80) 04/02/2024  5.  Patient will be able to stand long enough to wash dishes with < or = to 2/10 back pain. Baseline: has to take rests Goal status: MET 03/15/2024  6.  Patient will demonstrate proper posture when bending and lifting to prevent back injury. Baseline:  Goal status:IN PROGRESS 04/02/2024  7.  Patient will be able to negotiate stairs with a reciprocal pattern with LRAD to improve functional mobility. Baseline:  Goal status:IN PROGRESS 04/02/2024  8.  Patient will ambulate > or = to 85ft with LRAD during for improved community mobility. Baseline:  Goal status:IN PROGRESS 04/02/2024    PLAN:  PT FREQUENCY: 2x/week  PT DURATION: 6 weeks  PLANNED INTERVENTIONS: 97164- PT Re-evaluation, 97110-Therapeutic exercises, 97530- Therapeutic activity, 97112- Neuromuscular re-education, 97535- Self Care,  97140- Manual therapy, [phone]- Gait training, 04007- Canalith repositioning, 02886- Aquatic Therapy, [phone]- Electrical stimulation (unattended), [phone]- Electrical stimulation (manual), [id number]- Vasopneumatic device, [id number]- Ultrasound, [id number]- Ionotophoresis 4mg /ml Dexamethasone , 79439 (1-2 muscles), [id number] (3+ muscles)- Dry Needling, Patient/Family education, Balance training, Stair training, Taping, Joint mobilization, Joint manipulation, Spinal manipulation, Spinal mobilization, Vestibular training, Cryotherapy, and Moist heat  PLAN FOR NEXT SESSION: assess pain levels; floor transfer; single leg balance; standing/ walking tolerance; LE strengthening; ankle strengthening & ROM; core strengthening   Kristeen Sar, PT 04/08/24 9:31 AM

## 2024-04-09 ENCOUNTER — Ambulatory Visit
Admission: RE | Admit: 2024-04-09 | Discharge: 2024-04-09 | Disposition: A | Discharge status: Home / Self Care | Source: Ambulatory Visit | Attending: Family Medicine | Admitting: Family Medicine

## 2024-04-09 VITALS — BP 154/76 | HR 67 | Temp 97.9°F | Resp 18

## 2024-04-09 DIAGNOSIS — J309 Allergic rhinitis, unspecified: Secondary | ICD-10-CM | POA: Diagnosis not present

## 2024-04-09 DIAGNOSIS — R062 Wheezing: Secondary | ICD-10-CM | POA: Diagnosis not present

## 2024-04-09 DIAGNOSIS — R051 Acute cough: Secondary | ICD-10-CM | POA: Diagnosis not present

## 2024-04-09 DIAGNOSIS — R030 Elevated blood-pressure reading, without diagnosis of hypertension: Secondary | ICD-10-CM | POA: Diagnosis not present

## 2024-04-09 NOTE — ED Provider Notes (Signed)
 [facility]    CSN: [id number] Arrival date & time: 04/09/24  9091      History   Chief Complaint Chief Complaint  Patient presents with   Allergic Reaction    Allergy recheck Saw Vernell Hammersmith Monday - Entered by patient    HPI Chelsea Bell is a 68 y.o. female.   Patient presenting today with ongoing allergy and bronchitis type symptoms for the past several weeks.  Was seen at the beginning of this week at this urgent care and given a shot of Decadron , course of doxycycline  and has been using her albuterol  inhaler and allergy regimen as well as over-the-counter medications as needed.  States her symptoms are not worse and may be slightly better but still not resolving.  Denies chest pain, shortness of breath, fevers, abdominal pain, vomiting, diarrhea.     Past Medical History:  Diagnosis Date   Cancer [facility])    endometrial cancer- surgery only   Concussion    GERD (gastroesophageal reflux disease)     Patient Active Problem List   Diagnosis Date Noted   Overactive bladder 10/29/2023   SUI (stress urinary incontinence, female) 10/29/2023   Chest pain 10/07/2021    Past Surgical History:  Procedure Laterality Date   ABDOMINAL HYSTERECTOMY     COLONOSCOPY WITH PROPOFOL  N/A 06/09/2014   Procedure: COLONOSCOPY WITH PROPOFOL ;  Surgeon: Lamar Donnald GAILS, MD;  Location: [facility];  Service: Endoscopy;  Laterality: N/A;   DILATION AND CURETTAGE OF UTERUS     LAPAROSCOPIC OVARIAN     diagnostic with 1 ovary removed , then Hysterectomy(endometrial cancer) with other ovary removed   TONSILLECTOMY      OB History     Gravida  0   Para  0   Term  0   Preterm  0   AB  0   Living  0      SAB  0   IAB  0   Ectopic  0   Multiple  0   Live Births  0            Home Medications    Prior to Admission medications   Medication Sig Start Date End Date Taking? Authorizing Provider  albuterol  (VENTOLIN  HFA) 108 (90 Base) MCG/ACT inhaler  Inhale 2 puffs into the lungs every 4 (four) hours as needed. 04/05/24   Hammersmith Vernell Norris, PA-C  celecoxib (CELEBREX) 200 MG capsule Take 200 mg by mouth 2 (two) times daily as needed. 03/18/24   [provider]  doxycycline  (VIBRAMYCIN ) 100 MG capsule Take 1 capsule (100 mg total) by mouth 2 (two) times daily. 04/05/24   Hammersmith Vernell Norris, PA-C  fluorometholone (FML) 0.1 % ophthalmic suspension INSTILL 1 DROP INTO EACH EYE 4 TIMES DAILY FOR 2 WEEKS 02/12/24   [provider]  fluticasone  (FLONASE ) 50 MCG/ACT nasal spray Place 1 spray into both nostrils 2 (two) times daily. 02/28/23   Hammersmith Vernell Norris, PA-C  mirabegron  ER (MYRBETRIQ ) 50 MG TB24 tablet Take 1 tablet (50 mg total) by mouth daily. 02/18/24   [name], MD  montelukast (SINGULAIR) 10 MG tablet Take 10 mg by mouth daily. 03/29/24   [provider]  MYRBETRIQ  50 MG TB24 tablet Take 50 mg by mouth daily.    [provider]  OVER THE COUNTER MEDICATION Take 1 each by mouth every morning. Onguard oil (doterra brand) --puts a drop on tongue    [provider]  OVER THE COUNTER  MEDICATION Take 1 drop by mouth every morning. Frankinsense (doterra Brand) --one drop on tongue.    [provider]  pantoprazole (PROTONIX) 40 MG tablet Take 40 mg by mouth daily.    [provider]  promethazine -dextromethorphan (PROMETHAZINE -DM) 6.25-15 MG/5ML syrup Take 5 mLs by mouth 4 (four) times daily as needed. 04/05/24   Stuart Vernell Norris, PA-C  Saline (AYR SALINE NASAL DROPS) 0.65 % SOLN Place 2 sprays into the nose.    [provider]    Family History Family History  Problem Relation Age of Onset   Heart failure Mother    Hyperlipidemia Mother    Diabetes Mother    Hypertension Mother    Thyroid  disease Mother    Osteoporosis Mother    Arthritis Mother    Cancer Father        Ear   Cerebrovascular Accident Father    Melanoma Father    Parkinson's  disease Father    Stroke Father    Neuropathy Father    Hyperlipidemia Sister    Asthma Sister    Hypertension Sister    Irritable bowel syndrome Sister    Diabetes Maternal Grandmother    Arthritis Maternal Grandmother    Osteoporosis Maternal Grandmother    Heart disease Paternal Grandfather    Breast cancer Paternal Uncle    Bladder Cancer Neg Hx    Renal cancer Neg Hx    Uterine cancer Neg Hx     Social History Social History   Tobacco Use   Smoking status: Never   Smokeless tobacco: Never  Vaping Use   Vaping status: Never Used  Substance Use Topics   Alcohol use: No   Drug use: No     Allergies   Dilaudid [hydromorphone hcl], Oxycodone-acetaminophen , Percocet [oxycodone-acetaminophen ], Amoxicillin, Codeine, and Nickel   Review of Systems Review of Systems Per HPI  Physical Exam Triage Vital Signs ED Triage Vitals  Encounter Vitals Group     BP 04/09/24 0924 (!) 154/76     Girls Systolic BP Percentile --      Girls Diastolic BP Percentile --      Boys Systolic BP Percentile --      Boys Diastolic BP Percentile --      Pulse Rate 04/09/24 0924 67     Resp 04/09/24 0924 18     Temp 04/09/24 0924 97.9 F (36.6 C)     Temp Source 04/09/24 0924 Oral     SpO2 04/09/24 0924 95 %     Weight --      Height --      Head Circumference --      Peak Flow --      Pain Score 04/09/24 0922 0     Pain Loc --      Pain Education --      Exclude from Growth Chart --    No data found.  Updated Vital Signs BP (!) 154/76 (BP Location: Left Arm) Comment: second attempt  Pulse 67   Temp 97.9 F (36.6 C) (Oral)   Resp 18   SpO2 95%   Visual Acuity Right Eye Distance:   Left Eye Distance:   Bilateral Distance:    Right Eye Near:   Left Eye Near:    Bilateral Near:     Physical Exam Vitals and nursing note reviewed.  Constitutional:      Appearance: Normal appearance.  HENT:     Head: Atraumatic.     Right Ear: Tympanic membrane  and external ear  normal.     Left Ear: Tympanic membrane and external ear normal.     Nose: Rhinorrhea present.     Mouth/Throat:     Mouth: Mucous membranes are moist.     Pharynx: Oropharynx is clear. No oropharyngeal exudate.  Eyes:     Extraocular Movements: Extraocular movements intact.     Conjunctiva/sclera: Conjunctivae normal.  Cardiovascular:     Rate and Rhythm: Normal rate and regular rhythm.     Heart sounds: Normal heart sounds.  Pulmonary:     Effort: Pulmonary effort is normal.     Breath sounds: Wheezing present. No rales.  Musculoskeletal:        General: Normal range of motion.     Cervical back: Normal range of motion and neck supple.  Skin:    General: Skin is warm and dry.  Neurological:     Mental Status: She is alert and oriented to person, place, and time.  Psychiatric:        Mood and Affect: Mood normal.        Thought Content: Thought content normal.      UC Treatments / Results  Labs (all labs ordered are listed, but only abnormal results are displayed) Labs Reviewed - No data to display  EKG   Radiology No results found.  Procedures Procedures (including critical care time)  Medications Ordered in UC Medications - No data to display  Initial Impression / Assessment and Plan / UC Course  I have reviewed the triage vital signs and the nursing notes.  Pertinent labs & imaging results that were available during my care of the patient were reviewed by me and considered in my medical decision making (see chart for details).     Mildly hypertensive in triage, discussed to continue loosely monitoring this at home but that is likely secondary to her symptoms and the recent Decadron  shot.  Follow-up with PCP if not resolving back to baseline.  Lifestyle modifications reviewed.  Continue current treatment for allergic sinusitis and bronchitis.  Will forego another steroid shot today as she has had 2 courses of steroids over the past 2 weeks of illness but will  complete doxycycline , continue inhalers and allergy regimen and add Mucinex twice daily.  Return for worsening symptoms.  Final Clinical Impressions(s) / UC Diagnoses   Final diagnoses:  Allergic sinusitis  Acute cough  Wheezing  Elevated blood pressure reading     Discharge Instructions      Continue your current medications, allergy regimen, Navage and add plain Mucinex twice daily until fully resolved.  Follow-up for worsening symptoms at any time.  Loosely monitor your blood pressure readings at home and follow-up for ongoing or worsening elevated readings with primary care but I suspect it to be related to recent steroid shot and respiratory infection    ED Prescriptions   None    PDMP not reviewed this encounter.   Stuart Vernell Norris, PA-C 04/09/24 1542

## 2024-04-09 NOTE — Progress Notes (Signed)
 Subjective:   I, Ileana Collet, PhD, LAT, ATC acting as a scribe for Artist Lloyd, MD.  Chief Complaint: Chelsea Bell,  is a 68 y.o. female who presents for initial evaluation of a head injury. Pt was walking out of her small bathroom and tripped over a stool, striking her head on the edge of the door frame. No LOC, but was very disoriented. EMS transported to the ED.   Today, pt c/o cont'd photophobia, sweating, tenderness on her head, lightheadedness, intermittent HA, and lack of energy. Symptoms seem to be less frequent.   Dx imaging: 01/11/24 Head CT  Injury date: 01/11/24 Visit #: 1  History of Present Illness:   Concussion Self-Reported Symptom Score Symptoms rated on a scale 1-6, in last 24 hours  Headache: 0   Pressure in head: 0 Neck pain: 0 Nausea or vomiting: 0 Dizziness: 1  Blurred vision: 3  Balance problems: 0 Sensitivity to light:  2 Sensitivity to noise: 0 Feeling slowed down: 2 Feeling like "in a fog": 0 Don't feel right": 1 Difficulty concentrating: 0 Difficulty remembering: 0 Fatigue or low energy: 3 Confusion: 0 Drowsiness: 0 More emotional: 0 Irritability: 0 Sadness: 0 Nervous or anxious: 0 Trouble falling asleep: 0   Total # of Symptoms: 6/22 Total Symptom Score: 12/132  Tinnitus: No  Review of Systems: No fevers or chills    Review of History: Overactive bladder, obesity  Objective:    Physical Examination Vitals:   04/12/24 1040  BP: 128/82  Pulse: 72  SpO2: 99%   MSK: Normal lumbar motion.  Mild antalgic gait Neuro: Normal coordination.  Mildly impaired balance Psych: Normal speech thought process and affect     Imaging:  EXAM: CT HEAD WITHOUT CONTRAST   CT CERVICAL SPINE WITHOUT CONTRAST   TECHNIQUE: Multidetector CT imaging of the head and cervical spine was performed following the standard protocol without intravenous contrast. Multiplanar CT image reconstructions of the cervical spine were also  generated.   RADIATION DOSE REDUCTION: This exam was performed according to the departmental dose-optimization program which includes automated exposure control, adjustment of the mA and/or kV according to patient size and/or use of iterative reconstruction technique.   COMPARISON:  10/06/2021.   FINDINGS: CT HEAD FINDINGS   Brain: No evidence of acute infarction, hemorrhage, hydrocephalus, extra-axial collection or mass lesion/mass effect.   Vascular: No hyperdense vessel or unexpected calcification.   Skull: Normal. Negative for fracture or focal lesion.   Sinuses/Orbits: No acute finding.   Other: There is a left frontal scalp hematoma, image 18/3. No underlying skull fracture.   CT CERVICAL SPINE FINDINGS   Alignment: No acute, posttraumatic malalignment of the cervical spine. Straightening of normal cervical lordosis may reflect muscle spasm or patient positioning.   Skull base and vertebrae: No acute fracture. No primary bone lesion or focal pathologic process.   Soft tissues and spinal canal: No prevertebral fluid or swelling. No visible canal hematoma.   Disc levels: Multilevel disc space narrowing and endplate spurring is noted. This is most severe at C4-5, C5-6 and C6-7.   Upper chest: None   Other: None   IMPRESSION: 1. No acute intracranial abnormalities. 2. Left frontal scalp hematoma. No underlying skull fracture. 3. No evidence for cervical spine fracture or subluxation. 4. Cervical degenerative disc disease.     Electronically Signed   By: Waddell Calk M.D.   On: 01/11/2024 10:13 I, Artist Lloyd, personally (independently) visualized and performed the interpretation of the images attached in this  note.   Assessment and Plan   68 y.o. female with concussion occurring about 3 months ago.  Overall symptoms are improving with time.  She has done more conventional physical therapy especially for some knee pain and balance issues.  This physical  therapy also included aquatic therapy which has been quite helpful.  Overall I think she is doing pretty well and can start incorporating more regular life activities and home exercise into her daily activity.  I do recommend being a member of the gym and it looks like [facility] is going to be a pretty good option for her.  Next step could be neuro rehab with vestibular physical therapy if needed.  Additionally happy to spend more time and attention on her other orthopedic issues like knee and ankle pain.    Recheck in 1 month    Action/Discussion: Reviewed diagnosis, management options, expected outcomes, and the reasons for scheduled and emergent follow-up. Questions were adequately answered. Patient expressed verbal understanding and agreement with the following plan.     Patient Education: Reviewed with patient the risks (i.e, a repeat concussion, post-concussion syndrome, second-impact syndrome) of returning to play prior to complete resolution, and thoroughly reviewed the signs and symptoms of concussion.Reviewed need for complete resolution of all symptoms, with rest AND exertion, prior to return to play. Reviewed red flags for urgent medical evaluation: worsening symptoms, nausea/vomiting, intractable headache, musculoskeletal changes, focal neurological deficits. [facility] Concussion Care Plan, which clearly outlines the plans stated above, was given to patient.   Level of service: Total encounter time 30 minutes including face-to-face time with the patient and, reviewing past medical record, and charting on the date of service.        After Visit Summary printed out and provided to patient as appropriate.  The above documentation has been reviewed and is accurate and complete Artist Lloyd

## 2024-04-09 NOTE — Discharge Instructions (Signed)
 Continue your current medications, allergy regimen, Navage and add plain Mucinex twice daily until fully resolved.  Follow-up for worsening symptoms at any time.  Loosely monitor your blood pressure readings at home and follow-up for ongoing or worsening elevated readings with primary care but I suspect it to be related to recent steroid shot and respiratory infection

## 2024-04-09 NOTE — ED Triage Notes (Signed)
 Pt reports she has a cough that is related to allergies. Told to return if sxs worsen.  Currently taking inhaler, prometh  cough syurp, and doxy but no relief

## 2024-04-12 ENCOUNTER — Ambulatory Visit (INDEPENDENT_AMBULATORY_CARE_PROVIDER_SITE_OTHER): Admitting: Family Medicine

## 2024-04-12 VITALS — BP 128/82 | HR 72 | Ht 66.0 in | Wt 303.0 lb

## 2024-04-12 DIAGNOSIS — S060X0A Concussion without loss of consciousness, initial encounter: Secondary | ICD-10-CM

## 2024-04-12 NOTE — Patient Instructions (Addendum)
 Thank you for coming in today.   OK to join a gym and start doing exercise more rigorously   Check back in 1 month

## 2024-04-13 ENCOUNTER — Encounter (HOSPITAL_BASED_OUTPATIENT_CLINIC_OR_DEPARTMENT_OTHER): Payer: Self-pay | Admitting: Physical Therapy

## 2024-04-13 ENCOUNTER — Ambulatory Visit (HOSPITAL_BASED_OUTPATIENT_CLINIC_OR_DEPARTMENT_OTHER): Admitting: Physical Therapy

## 2024-04-13 ENCOUNTER — Encounter: Payer: Self-pay | Admitting: Family Medicine

## 2024-04-13 DIAGNOSIS — G8929 Other chronic pain: Secondary | ICD-10-CM

## 2024-04-13 DIAGNOSIS — M5459 Other low back pain: Secondary | ICD-10-CM

## 2024-04-13 DIAGNOSIS — M25561 Pain in right knee: Secondary | ICD-10-CM | POA: Diagnosis not present

## 2024-04-13 DIAGNOSIS — M6281 Muscle weakness (generalized): Secondary | ICD-10-CM

## 2024-04-13 NOTE — Therapy (Signed)
 OUTPATIENT PHYSICAL THERAPY LOWER EXTREMITY TREATMENT   Patient Name: Chelsea Bell MRN: [id number] DOB:[birth year], 68 y.o., female Today's Date: 04/13/2024  END OF SESSION:  PT End of Session - 04/13/24 0900     Visit Number 31    Date for Recertification  04/23/24    Authorization Type BCBS Medicare    Progress Note Due on Visit 39    PT Start Time 0843    PT Stop Time 0925    PT Time Calculation (min) 42 min    Activity Tolerance Patient tolerated treatment well    Behavior During Therapy [facility] for tasks assessed/performed            Past Medical History:  Diagnosis Date   Cancer (HCC)    endometrial cancer- surgery only   Concussion    GERD (gastroesophageal reflux disease)    Past Surgical History:  Procedure Laterality Date   ABDOMINAL HYSTERECTOMY     COLONOSCOPY WITH PROPOFOL  N/A 06/09/2014   Procedure: COLONOSCOPY WITH PROPOFOL ;  Surgeon: Lamar Donnald GAILS, MD;  Location: [facility];  Service: Endoscopy;  Laterality: N/A;   DILATION AND CURETTAGE OF UTERUS     LAPAROSCOPIC OVARIAN     diagnostic with 1 ovary removed , then Hysterectomy(endometrial cancer) with other ovary removed   TONSILLECTOMY     Patient Active Problem List   Diagnosis Date Noted   Overactive bladder 10/29/2023   SUI (stress urinary incontinence, female) 10/29/2023   Chest pain 10/07/2021    PCP: Marvine Rush, MD  REFERRING PROVIDER: Marvine Rush, MD  REFERRING DIAG: Rt 26.7 Gait training  THERAPY DIAG:  Chronic pain of both knees  Muscle weakness (generalized)  Other low back pain  Rationale for Evaluation and Treatment: Rehabilitation  ONSET DATE:  01/11/2024  SUBJECTIVE:   SUBJECTIVE STATEMENT: Pt reports she saw dr yesterday and was told to increase activity, but continue to listen to body.   POOL ACCESS: (04/13/24) Pt plans to join [employer] for use of pool in [place] and gym in [place].   Eval: Patient presents with pain in her feet and ankles. June 22  she had a bad fall. She had stool in her bathroom and she tripped over it and she hit her head on the door frame. She has multiple bruises on her head, knee, and ankle. Patient denies a brain bleed and any broken bones. She had a follow up with the orthopedic doctor and she has multiple appointments coming up. She has a concussion and does not get frequent headaches but she gets occasional twinges and sharp pains. She still get very fatigued and she wants to rest majority of the day. Her left knee is her bad knee. She does not use her straight cane in her house, but she used out in the community [employer], appointments etc). She sometimes uses the scooter when she grocery shopes. Her goal is to not use the straight cane. She feels she is too young to be using a straight cane.   PERTINENT HISTORY: Currently in pelvic therapy; hx cancer, B plantar fasciitis (chronic) PAIN:  Are you having pain?PAIN:  Are you having pain? Yes NPRS scale: 4/10 Pain location:low back, bil ankles, knees  PAIN TYPE: aching and dull Pain description: intermittent and aching  Aggravating factors: over doing it Relieving factors: seated foot massage device, ice bottles, massage gun   PRECAUTIONS: Fall and Other: Concussion   RED FLAGS: None   WEIGHT BEARING RESTRICTIONS: No  FALLS:  Has patient fallen in  last 6 months? Yes. Number of falls 1 fall 01/11/2024 see details above  LIVING ENVIRONMENT: Lives with: lives alone Lives in: House/apartment Stairs: Yes: Internal: 3.5 steps; on left going up Has following equipment at home: Single point cane, Shower bench, and Grab bars  OCCUPATION: Not currently working  PLOF: Independent, Independent with basic ADLs, Independent with household mobility without device, Independent with community mobility with device, Independent with gait, Independent with transfers, and Leisure: teaches painting classes   PATIENT GOALS: To get rid of straight cane; To be able to  walk up the steps normally; to be more mobile    NEXT MD VISIT: This week with PCP  OBJECTIVE:  Note: Objective measures were completed at Evaluation unless otherwise noted.  DIAGNOSTIC FINDINGS: Patient had imaging done on 01/11/2024 after fall. Head CT, CT C-spine, Knee, and shoulder imaging was normal no concerns.  PATIENT SURVEYS:  LEFS: 38/80 47.5  03/15/2024 LEFS 35/80 43.8%  04/02/2024 LEFS:50/80 62.5%   COGNITION: Overall cognitive status: Within functional limits for tasks assessed     SENSATION: WFL   POSTURE: rounded shoulders and forward head    LOWER EXTREMITY ROM: WFL bilateral    LOWER EXTREMITY MMT: Grossly 4/5 bilateral     FUNCTIONAL TESTS:  5 times sit to stand: 13.80 sec ; hands on knees; some pain in right hip Timed up and go (TUG): 12.48 sec no straight cane; 11.40 sec with straight cane  02/02/24 748 ft and RPE 6-7/10  - pressure starts but less than 2/10  03/01/2024 645ft ft no cane (antalgic gait) verbal cues for arm swing, ankle pain started at 2 mins RPE 5/10  03/11/2024 : 749ft no cane; ankle pain after 2 mins; no knee pain during walk (ankles were limiting factor)  03/15/2024 5STS 13.49 sec hands on knees; no pain; better performance than eval TUG: 11.22 sec no cane  04/02/2024 5STS 12.90 sec no UE support   GAIT: Assistive device utilized: Single point cane Comments: antalgic gait                                                                                                                                TREATMENT DATE:   Date: 04/13/24 Pt seen for aquatic therapy today.  Treatment took place in water 3.5-4.75 ft in depth at the [facility] pool. Temp of water was 91.  Pt entered/exited the pool via stairs independently with bil rail.  - unsupported walking forward/ backward  - side stepping with arm add/abdct with yellow -> rainbow hand floats - suitcase carry with marching forward/ backward with bil rainbow  hand floats at sides - marching backward/forward with reciprocal row on top of water with UE on rainbow hand floats - UE on yellow hand floats:  3 way LE kicks (hip extension to toe touch) 2 x 4 each LE, (good balance challenge, cues to not kick LE too high); heel/toe raises x 10; hamstring  curls x 10, alternating LEs - vertical squats pushing single yellow hand float under water x 10 - tandem gait forward/ backward, hands resting on top of water -L stretch at stairs - R/L hamstring stretch with foot on 2nd step  [facility] Adult PT Treatment:                                              04/08/24  Nustep x 7 min level 3 Seated hamstring stretch 3 x 20 sec Seated QL stretch with peanut ball 4 x 15 sec TA activation + pelvic floor lift x 12 Seated rockerboard ankle (PF/DF) x 1.5 mins Standing hip abduction and extension with yellow loop around ankles x 8 bilateral  Standing chest press 4# DB 2 x 10  Forward T x 7 bilateral     04/02/24  Nustep x 6 min level 4 5 STS : 12.90 sec no UE support  LEFS: 50/80 62.5% Goal Assessment & progress made with therapy Forward T x5 each side Sit to stand 2 x 5 holding 10# DB  Lunge to BOSU x 10 bilateral (light UE support at barre) Single leg hurdle step overs holding 7# DB x 10 bilateral (UE support at barre)    [facility] Adult PT Treatment:                                              03/25/24  Nustep x 4 min level 4 Standing hamstring stretch at stair 2x 20sec bilateral  Sit to stand x 10 holding 7# DB Patient verbalized some leakage so cued her to do TA engagement + pelvic floor lift Sit to stand in staggered stance x 6 each LE  Lateral band walks with green TB x 2 laps of 15 ft Forward T x5 each side Step up on airex + knee drive x 10     PATIENT EDUCATION:  Education details:aquatic therapy exercise progressions/modifications  Person educated: Patient Education method: Explanation, Demonstration Education comprehension: verbalized  understanding, returned demonstration, and needs further education  HOME EXERCISE PROGRAM: AQUATIC Access Code: [email or web address] URL: [email or web address] Date: 04/13/2024  * not issued yet Prepared by: [facility] [address]   Access Code: [email or web address] URL: [email or web address] Date: 03/25/2024 Prepared by: Kristeen Sar  Exercises - Seated Long Arc Quad  - 2 x daily - 7 x weekly - 2 sets - 10 reps - 2 hold - Long Sitting Ankle Pumps  - 1 x daily - 7 x weekly - 2 sets - 10 reps - Seated Heel Raise  - 2 x daily - 7 x weekly - 2 sets - 10 reps - Seated Ankle Alphabet  - 2 x daily - 7 x weekly - 1 sets - 1 reps - Seated March  - 2 x daily - 7 x weekly - 2 sets - 10 reps - Seated Ankle Dorsiflexion AROM  - 2 x daily - 7 x weekly - 2 sets - 10 reps - Forward Step Up with Counter Support  - 1 x daily - 7 x weekly - 1 sets - 5 reps - Seated Hamstring Stretch  - 2 x daily - 7 x weekly - 1 sets - 3 reps - 20 hold - Seated Figure 4 Piriformis  Stretch  - 2 x daily - 7 x weekly - 1 sets - 3 reps - 20 hold - Seated Hip External Rotation AROM  - 2 x daily - 7 x weekly - 1-2 sets - 10 reps - Side Stepping with Resistance at Feet  - 1 x daily - 7 x weekly - 3 sets - 5 reps - Seated Ankle Inversion with Resistance and Legs Crossed  - 1 x daily - 7 x weekly - 2 sets - 10 reps - Long Sitting Ankle Inversion with Anchored Resistance  - 1 x daily - 7 x weekly - 2 sets - 10 reps - Seated Ankle Eversion with Resistance  - 1 x daily - 7 x weekly - 2 sets - 10 reps - Forward T with Counter Support  - 1 x daily - 7 x weekly - 1-2 sets - 5-8 reps   Pt Education:  [email or web address]  ASSESSMENT:  CLINICAL IMPRESSION:  Chelsea Bell reported good reduction of knee and ankle pain when exercising in water.  Some mild increase in lower back pain with use of floatation resistance; eased with change  in exercise.  Will plan to issue aquatic HEP and instruct on it next session in preparation of d/c from aquatic therapy.  Patient will benefit from skilled PT to address the below impairments and improve overall function.     Eval: Patient is a 68 y.o. female who was seen today for physical therapy evaluation and treatment for gait training. Chelsea Bell presents with chronic bilateral foot and ankle that was re aggravated January 11, 2024 after a fall. She fell in her bathroom and she hit her head on the door threshold. She had imaging down and everything came back unremarkable. She does have a concussion and she is limited in her able to bend and pick up items and she gets occasional sharp pains in her head. She denies frequent headaches. She goal is to initial a gym routine and stay mobile. Patient would benefit from some sessions of aquatic therapy to establish an aquatic program to perform on her own. Patient is motivated and wants to get better. Patient will benefit from skilled PT to address the below impairments and improve overall function.    OBJECTIVE IMPAIRMENTS: Abnormal gait, decreased balance, decreased endurance, difficulty walking, decreased ROM, decreased strength, increased muscle spasms, prosthetic dependency , and pain.   ACTIVITY LIMITATIONS: carrying, lifting, bending, standing, squatting, stairs, transfers, and locomotion level  PARTICIPATION LIMITATIONS: meal prep, cleaning, laundry, interpersonal relationship, shopping, community activity, and church  PERSONAL FACTORS: Age, Fitness, and Time since onset of injury/illness/exacerbation are also affecting patient's functional outcome.   REHAB POTENTIAL: Good  CLINICAL DECISION MAKING: Evolving/moderate complexity  EVALUATION COMPLEXITY: Moderate   GOALS: Goals reviewed with patient? Yes  SHORT TERM GOALS: Target date: 02/17/2024  Patient will be independent with initial HEP. Baseline:  Goal status: MET  2.  Patient will  be able to participant a to establish a baseline for functional test. Baseline: will test next visit due to foot pain today (02/16/24) Goal status: MET 03/15/2024   3.  Patient will perform 5STS in < or = to 12 sec for improved functional mobility. Baseline:  Goal status: MET 04/02/2024   LONG TERM GOALS: Target date: 04/23/2024  Patient will demonstrate independence in advanced HEP. Baseline:  Goal status: IN PROGRESS 04/02/2024  2.  Patient will report > or = to 50% improvement in ankle and foot pain since starting PT. Baseline:  Goal status: IN  PROGRESS (40%) 04/02/2024  3.  Patient will verbalize and demonstrate self-care strategies to manage pain including tissue mobility practices and change of position. Baseline:  Goal status: IN PROGRESS 04/02/2024  4.  Patient will score > or = to 48/80 on LEFS due to improved function. Baseline: 38/80 Goal status: MET  (50/80) 04/02/2024  5.  Patient will be able to stand long enough to wash dishes with < or = to 2/10 back pain. Baseline: has to take rests Goal status: MET 03/15/2024  6.  Patient will demonstrate proper posture when bending and lifting to prevent back injury. Baseline:  Goal status:IN PROGRESS 04/02/2024  7.  Patient will be able to negotiate stairs with a reciprocal pattern with LRAD to improve functional mobility. Baseline:  Goal status:IN PROGRESS 04/02/2024  8.  Patient will ambulate > or = to 840ft with LRAD during for improved community mobility. Baseline:  Goal status:IN PROGRESS 04/02/2024    PLAN:  PT FREQUENCY: 2x/week  PT DURATION: 6 weeks  PLANNED INTERVENTIONS: 97164- PT Re-evaluation, 97110-Therapeutic exercises, 97530- Therapeutic activity, 97112- Neuromuscular re-education, 97535- Self Care, [id number]- Manual therapy, [phone]- Gait training, [phone]- Canalith repositioning, [phone]- Aquatic Therapy, [phone]- Electrical stimulation (unattended), [phone]- Electrical stimulation (manual), [id number]-  Vasopneumatic device, [id number]- Ultrasound, [id number]- Ionotophoresis 4mg /ml Dexamethasone , 79439 (1-2 muscles), 20561 (3+ muscles)- Dry Needling, Patient/Family education, Balance training, Stair training, Taping, Joint mobilization, Joint manipulation, Spinal manipulation, Spinal mobilization, Vestibular training, Cryotherapy, and Moist heat  PLAN FOR NEXT SESSION: assess pain levels; floor transfer; single leg balance; standing/ walking tolerance; LE strengthening; ankle strengthening & ROM; core strengthening   Delon Aquas, PTA 04/13/24 1:36 PM [facility] [address] [place], [place], [address] Phone: [phone]   Fax:  [phone]

## 2024-04-14 NOTE — Telephone Encounter (Signed)
 Forwarding to Dr. Denyse Amass to review and advise.

## 2024-04-15 ENCOUNTER — Ambulatory Visit: Admitting: Physical Therapy

## 2024-04-15 DIAGNOSIS — M5459 Other low back pain: Secondary | ICD-10-CM

## 2024-04-15 DIAGNOSIS — G8929 Other chronic pain: Secondary | ICD-10-CM

## 2024-04-15 DIAGNOSIS — M25561 Pain in right knee: Secondary | ICD-10-CM | POA: Diagnosis not present

## 2024-04-15 DIAGNOSIS — M6281 Muscle weakness (generalized): Secondary | ICD-10-CM

## 2024-04-15 NOTE — Therapy (Signed)
 OUTPATIENT PHYSICAL THERAPY LOWER EXTREMITY TREATMENT   Patient Name: Chelsea Bell MRN: [id number] DOB:1955/11/19, 68 y.o., female Today's Date: 04/15/2024  END OF SESSION:  PT End of Session - 04/15/24 1539     Visit Number 32    Date for Recertification  04/23/24    Authorization Type BCBS Medicare    Progress Note Due on Visit 39    PT Start Time 1534    PT Stop Time 1612    PT Time Calculation (min) 38 min    Activity Tolerance Patient tolerated treatment well            Past Medical History:  Diagnosis Date   Cancer (HCC)    endometrial cancer- surgery only   Concussion    GERD (gastroesophageal reflux disease)    Past Surgical History:  Procedure Laterality Date   ABDOMINAL HYSTERECTOMY     COLONOSCOPY WITH PROPOFOL  N/A 06/09/2014   Procedure: COLONOSCOPY WITH PROPOFOL ;  Surgeon: Lamar Donnald GAILS, MD;  Location: [facility];  Service: Endoscopy;  Laterality: N/A;   DILATION AND CURETTAGE OF UTERUS     LAPAROSCOPIC OVARIAN     diagnostic with 1 ovary removed , then Hysterectomy(endometrial cancer) with other ovary removed   TONSILLECTOMY     Patient Active Problem List   Diagnosis Date Noted   Overactive bladder 10/29/2023   SUI (stress urinary incontinence, female) 10/29/2023   Chest pain 10/07/2021    PCP: Marvine Rush, MD  REFERRING PROVIDER: Marvine Rush, MD  REFERRING DIAG: Rt 26.7 Gait training  THERAPY DIAG:  Chronic pain of both knees  Muscle weakness (generalized)  Other low back pain  Rationale for Evaluation and Treatment: Rehabilitation  ONSET DATE:  01/11/2024  SUBJECTIVE:   SUBJECTIVE STATEMENT: Saw the doctor about the concussion and he wants me to push through some of that but listen to my body.  Will get recheck in a month.  It's my ankles that bother me more than the knee.  I did join [facility] so I can use the pool there.  I'll do the weights in [place] at [facility] there.  Hard to go down declines and steps.  I want to  get up from the floor.    POOL ACCESS: (04/13/24) Pt plans to join [employer] for use of pool in [place] and gym in [place].   Eval: Patient presents with pain in her feet and ankles. June 22 she had a bad fall. She had stool in her bathroom and she tripped over it and she hit her head on the door frame. She has multiple bruises on her head, knee, and ankle. Patient denies a brain bleed and any broken bones. She had a follow up with the orthopedic doctor and she has multiple appointments coming up. She has a concussion and does not get frequent headaches but she gets occasional twinges and sharp pains. She still get very fatigued and she wants to rest majority of the day. Her left knee is her bad knee. She does not use her straight cane in her house, but she used out in the community [employer], appointments etc). She sometimes uses the scooter when she grocery shopes. Her goal is to not use the straight cane. She feels she is too young to be using a straight cane.   PERTINENT HISTORY: Currently in pelvic therapy; hx cancer, B plantar fasciitis (chronic) PAIN:  Are you having pain? PAIN:  Are you having pain? Yes NPRS scale: 4/10 Pain location:low back, bil ankles, knees  PAIN TYPE: aching and dull Pain description: intermittent and aching  Aggravating factors: over doing it Relieving factors: seated foot massage device, ice bottles, massage gun   PRECAUTIONS: Fall and Other: Concussion   RED FLAGS: None   WEIGHT BEARING RESTRICTIONS: No  FALLS:  Has patient fallen in last 6 months? Yes. Number of falls 1 fall 01/11/2024 see details above  LIVING ENVIRONMENT: Lives with: lives alone Lives in: House/apartment Stairs: Yes: Internal: 3.5 steps; on left going up Has following equipment at home: Single point cane, Shower bench, and Grab bars  OCCUPATION: Not currently working  PLOF: Independent, Independent with basic ADLs, Independent with household mobility without device,  Independent with community mobility with device, Independent with gait, Independent with transfers, and Leisure: teaches painting classes   PATIENT GOALS: To get rid of straight cane; To be able to walk up the steps normally; to be more mobile    NEXT MD VISIT: This week with PCP  OBJECTIVE:  Note: Objective measures were completed at Evaluation unless otherwise noted.  DIAGNOSTIC FINDINGS: Patient had imaging done on 01/11/2024 after fall. Head CT, CT C-spine, Knee, and shoulder imaging was normal no concerns.  PATIENT SURVEYS:  LEFS: 38/80 47.5  03/15/2024 LEFS 35/80 43.8%  04/02/2024 LEFS:50/80 62.5%   COGNITION: Overall cognitive status: Within functional limits for tasks assessed     SENSATION: WFL   POSTURE: rounded shoulders and forward head    LOWER EXTREMITY ROM: WFL bilateral    LOWER EXTREMITY MMT: Grossly 4/5 bilateral     FUNCTIONAL TESTS:  5 times sit to stand: 13.80 sec ; hands on knees; some pain in right hip Timed up and go (TUG): 12.48 sec no straight cane; 11.40 sec with straight cane  02/02/24 748 ft and RPE 6-7/10  - pressure starts but less than 2/10  03/01/2024 623ft ft no cane (antalgic gait) verbal cues for arm swing, ankle pain started at 2 mins RPE 5/10  03/11/2024 : 7102ft no cane; ankle pain after 2 mins; no knee pain during walk (ankles were limiting factor)  03/15/2024 5STS 13.49 sec hands on knees; no pain; better performance than eval TUG: 11.22 sec no cane  04/02/2024 5STS 12.90 sec no UE support   GAIT: Assistive device utilized: Single point cane Comments: antalgic gait                                                                                                                                TREATMENT DATE:  [facility] Adult PT Treatment:                                              04/15/24  Nustep green machine x 9 min level 5 while discussing status and plan for session Wall gastroc/plantar fascia 30 sec hold 2x  right/left  Retro step as if going to 1/2 kneel 5x (good on left, painful on right knee)  Single leg Ts (needs single arm support) 10x right/left  4 inch forward step ups 10x right/left  with 2 hand support with palms Railing push ups 10x Sit to stand chair + cushion 5x; sit to stand chair + cushion holding 5# weight with overhead press 2 sets of 7 Discussed building up to floor transfers, including practicing components of it  Video resource how to get up of the floor (after a fall McGyver style)   Date: 04/13/24 Pt seen for aquatic therapy today.  Treatment took place in water 3.5-4.75 ft in depth at the [facility] pool. Temp of water was 91.  Pt entered/exited the pool via stairs independently with bil rail.  - unsupported walking forward/ backward  - side stepping with arm add/abdct with yellow -> rainbow hand floats - suitcase carry with marching forward/ backward with bil rainbow hand floats at sides - marching backward/forward with reciprocal row on top of water with UE on rainbow hand floats - UE on yellow hand floats:  3 way LE kicks (hip extension to toe touch) 2 x 4 each LE, (good balance challenge, cues to not kick LE too high); heel/toe raises x 10; hamstring curls x 10, alternating LEs - vertical squats pushing single yellow hand float under water x 10 - tandem gait forward/ backward, hands resting on top of water -L stretch at stairs - R/L hamstring stretch with foot on 2nd step  [facility] Adult PT Treatment:                                              04/08/24  Nustep x 7 min level 3 Seated hamstring stretch 3 x 20 sec Seated QL stretch with peanut ball 4 x 15 sec TA activation + pelvic floor lift x 12 Seated rockerboard ankle (PF/DF) x 1.5 mins Standing hip abduction and extension with yellow loop around ankles x 8 bilateral  Standing chest press 4# DB 2 x 10  Forward T x 7 bilateral     04/02/24  Nustep x 6 min level 4 5 STS : 12.90 sec no UE support   LEFS: 50/80 62.5% Goal Assessment & progress made with therapy Forward T x5 each side Sit to stand 2 x 5 holding 10# DB  Lunge to BOSU x 10 bilateral (light UE support at barre) Single leg hurdle step overs holding 7# DB x 10 bilateral (UE support at barre)    [facility] Adult PT Treatment:                                              03/25/24  Nustep x 4 min level 4 Standing hamstring stretch at stair 2x 20sec bilateral  Sit to stand x 10 holding 7# DB Patient verbalized some leakage so cued her to do TA engagement + pelvic floor lift Sit to stand in staggered stance x 6 each LE  Lateral band walks with green TB x 2 laps of 15 ft Forward T x5 each side Step up on airex + knee drive x 10     PATIENT EDUCATION:  Education details:aquatic therapy exercise progressions/modifications  Person educated: Patient Education method: Explanation,  Demonstration Education comprehension: verbalized understanding, returned demonstration, and needs further education  HOME EXERCISE PROGRAM: AQUATIC Access Code: [email or web address] URL: [email or web address] Date: 04/13/2024  * not issued yet Prepared by: [facility] - Outpatient Rehab - [address]   Access Code: [email or web address] URL: [email or web address] Date: 03/25/2024 Prepared by: Kristeen Sar  Exercises - Seated Long Arc Quad  - 2 x daily - 7 x weekly - 2 sets - 10 reps - 2 hold - Long Sitting Ankle Pumps  - 1 x daily - 7 x weekly - 2 sets - 10 reps - Seated Heel Raise  - 2 x daily - 7 x weekly - 2 sets - 10 reps - Seated Ankle Alphabet  - 2 x daily - 7 x weekly - 1 sets - 1 reps - Seated March  - 2 x daily - 7 x weekly - 2 sets - 10 reps - Seated Ankle Dorsiflexion AROM  - 2 x daily - 7 x weekly - 2 sets - 10 reps - Forward Step Up with Counter Support  - 1 x daily - 7 x weekly - 1 sets - 5 reps - Seated Hamstring Stretch  - 2 x daily - 7 x weekly - 1 sets - 3 reps - 20 hold - Seated Figure 4 Piriformis Stretch  - 2 x daily - 7  x weekly - 1 sets - 3 reps - 20 hold - Seated Hip External Rotation AROM  - 2 x daily - 7 x weekly - 1-2 sets - 10 reps - Side Stepping with Resistance at Feet  - 1 x daily - 7 x weekly - 3 sets - 5 reps - Seated Ankle Inversion with Resistance and Legs Crossed  - 1 x daily - 7 x weekly - 2 sets - 10 reps - Long Sitting Ankle Inversion with Anchored Resistance  - 1 x daily - 7 x weekly - 2 sets - 10 reps - Seated Ankle Eversion with Resistance  - 1 x daily - 7 x weekly - 2 sets - 10 reps - Forward T with Counter Support  - 1 x daily - 7 x weekly - 1-2 sets - 5-8 reps   Pt Education:  [email or web address]  ASSESSMENT:  CLINICAL IMPRESSION:  Treatment emphasis on strengthening LE, trunk and UE muscles and components of movement needed to accomplish her goal of being able to perform floor transfers without chair assist. Modifications needed secondary to ankle pain and knee pain.  Therapist providing verbal cues to optimize technique with  exercises in order to achieve the greatest benefit.    Eval: Patient is a 68 y.o. female who was seen today for physical therapy evaluation and treatment for gait training. Nathanel presents with chronic bilateral foot and ankle that was re aggravated January 11, 2024 after a fall. She fell in her bathroom and she hit her head on the door threshold. She had imaging down and everything came back unremarkable. She does have a concussion and she is limited in her able to bend and pick up items and she gets occasional sharp pains in her head. She denies frequent headaches. She goal is to initial a gym routine and stay mobile. Patient would benefit from some sessions of aquatic therapy to establish an aquatic program to perform on her own. Patient is motivated and wants to get better. Patient will benefit from skilled PT to address the below impairments and improve overall  function.    OBJECTIVE IMPAIRMENTS: Abnormal gait, decreased balance, decreased endurance, difficulty  walking, decreased ROM, decreased strength, increased muscle spasms, prosthetic dependency , and pain.   ACTIVITY LIMITATIONS: carrying, lifting, bending, standing, squatting, stairs, transfers, and locomotion level  PARTICIPATION LIMITATIONS: meal prep, cleaning, laundry, interpersonal relationship, shopping, community activity, and church  PERSONAL FACTORS: Age, Fitness, and Time since onset of injury/illness/exacerbation are also affecting patient's functional outcome.   REHAB POTENTIAL: Good  CLINICAL DECISION MAKING: Evolving/moderate complexity  EVALUATION COMPLEXITY: Moderate   GOALS: Goals reviewed with patient? Yes  SHORT TERM GOALS: Target date: 02/17/2024  Patient will be independent with initial HEP. Baseline:  Goal status: MET  2.  Patient will be able to participant a to establish a baseline for functional test. Baseline: will test next visit due to foot pain today (02/16/24) Goal status: MET 03/15/2024   3.  Patient will perform 5STS in < or = to 12 sec for improved functional mobility. Baseline:  Goal status: MET 04/02/2024   LONG TERM GOALS: Target date: 04/23/2024  Patient will demonstrate independence in advanced HEP. Baseline:  Goal status: IN PROGRESS 04/02/2024  2.  Patient will report > or = to 50% improvement in ankle and foot pain since starting PT. Baseline:  Goal status: IN PROGRESS (40%) 04/02/2024  3.  Patient will verbalize and demonstrate self-care strategies to manage pain including tissue mobility practices and change of position. Baseline:  Goal status: IN PROGRESS 04/02/2024  4.  Patient will score > or = to 48/80 on LEFS due to improved function. Baseline: 38/80 Goal status: MET  (50/80) 04/02/2024  5.  Patient will be able to stand long enough to wash dishes with < or = to 2/10 back pain. Baseline: has to take rests Goal  status: MET 03/15/2024  6.  Patient will demonstrate proper posture when bending and lifting to prevent back injury. Baseline:  Goal status:IN PROGRESS 04/02/2024  7.  Patient will be able to negotiate stairs with a reciprocal pattern with LRAD to improve functional mobility. Baseline:  Goal status:IN PROGRESS 04/02/2024  8.  Patient will ambulate > or = to 843ft with LRAD during for improved community mobility. Baseline:  Goal status:IN PROGRESS 04/02/2024    PLAN:  PT FREQUENCY: 2x/week  PT DURATION: 6 weeks  PLANNED INTERVENTIONS: 97164- PT Re-evaluation, 97110-Therapeutic exercises, 97530- Therapeutic activity, 97112- Neuromuscular re-education, 97535- Self Care, [id number]- Manual therapy, [phone]- Gait training, [phone]- Canalith repositioning, [phone]- Aquatic Therapy, [phone]- Electrical stimulation (unattended), [phone]- Electrical stimulation (manual), [id number]- Vasopneumatic device, [id number]- Ultrasound, [id number]- Ionotophoresis 4mg /ml Dexamethasone , 79439 (1-2 muscles), 20561 (3+ muscles)- Dry Needling, Patient/Family education, Balance training, Stair training, Taping, Joint mobilization, Joint manipulation, Spinal manipulation, Spinal mobilization, Vestibular training, Cryotherapy, and Moist heat  PLAN FOR NEXT SESSION: check progress towards goals to determine ERO vs readiness for discharge; aquatic PT;  floor transfer; single leg balance; standing/ walking tolerance; LE strengthening; ankle strengthening & ROM; core strengthening   Glade Pesa, PT 04/15/24 11:13 PM Phone: [phone] Fax: [phone]

## 2024-04-19 NOTE — Telephone Encounter (Signed)
 Called pt at [phone], left VM to call the office or view Dr. Virgilio response via MyChart.

## 2024-04-20 ENCOUNTER — Ambulatory Visit (HOSPITAL_BASED_OUTPATIENT_CLINIC_OR_DEPARTMENT_OTHER): Admitting: Physical Therapy

## 2024-04-20 ENCOUNTER — Encounter (HOSPITAL_BASED_OUTPATIENT_CLINIC_OR_DEPARTMENT_OTHER): Payer: Self-pay | Admitting: Physical Therapy

## 2024-04-20 DIAGNOSIS — G8929 Other chronic pain: Secondary | ICD-10-CM

## 2024-04-20 DIAGNOSIS — M5459 Other low back pain: Secondary | ICD-10-CM

## 2024-04-20 DIAGNOSIS — M25561 Pain in right knee: Secondary | ICD-10-CM | POA: Diagnosis not present

## 2024-04-20 DIAGNOSIS — M6281 Muscle weakness (generalized): Secondary | ICD-10-CM

## 2024-04-20 NOTE — Therapy (Signed)
 OUTPATIENT PHYSICAL THERAPY LOWER EXTREMITY TREATMENT   Patient Name: Chelsea Bell MRN: [id number] DOB:[birth year], 68 y.o., female Today's Date: 04/20/2024  END OF SESSION:  PT End of Session - 04/20/24 0900     Visit Number 33    Date for Recertification  04/23/24    Authorization Type BCBS Medicare    Progress Note Due on Visit 39    PT Start Time 0845    PT Stop Time 0925    PT Time Calculation (min) 40 min    Activity Tolerance Patient tolerated treatment well    Behavior During Therapy [facility] for tasks assessed/performed            Past Medical History:  Diagnosis Date   Cancer (HCC)    endometrial cancer- surgery only   Concussion    GERD (gastroesophageal reflux disease)    Past Surgical History:  Procedure Laterality Date   ABDOMINAL HYSTERECTOMY     COLONOSCOPY WITH PROPOFOL  N/A 06/09/2014   Procedure: COLONOSCOPY WITH PROPOFOL ;  Surgeon: Lamar Donnald GAILS, MD;  Location: [facility];  Service: Endoscopy;  Laterality: N/A;   DILATION AND CURETTAGE OF UTERUS     LAPAROSCOPIC OVARIAN     diagnostic with 1 ovary removed , then Hysterectomy(endometrial cancer) with other ovary removed   TONSILLECTOMY     Patient Active Problem List   Diagnosis Date Noted   Overactive bladder 10/29/2023   SUI (stress urinary incontinence, female) 10/29/2023   Chest pain 10/07/2021    PCP: Marvine Rush, MD  REFERRING PROVIDER: Marvine Rush, MD  REFERRING DIAG: Rt 26.7 Gait training  THERAPY DIAG:  Chronic pain of both knees  Muscle weakness (generalized)  Other low back pain  Rationale for Evaluation and Treatment: Rehabilitation  ONSET DATE:  01/11/2024  SUBJECTIVE:   SUBJECTIVE STATEMENT: Pt reports she started at the gym in [place].  She hopes to use the pool at least once a week and work up to 2x.      POOL ACCESS: (04/20/24) Pt joined [employer] for use of pool in [place] and gym in [place].   Eval: Patient presents with pain in her feet and ankles.  June 22 she had a bad fall. She had stool in her bathroom and she tripped over it and she hit her head on the door frame. She has multiple bruises on her head, knee, and ankle. Patient denies a brain bleed and any broken bones. She had a follow up with the orthopedic doctor and she has multiple appointments coming up. She has a concussion and does not get frequent headaches but she gets occasional twinges and sharp pains. She still get very fatigued and she wants to rest majority of the day. Her left knee is her bad knee. She does not use her straight cane in her house, but she used out in the community [employer], appointments etc). She sometimes uses the scooter when she grocery shopes. Her goal is to not use the straight cane. She feels she is too young to be using a straight cane.   PERTINENT HISTORY: Currently in pelvic therapy; hx cancer, B plantar fasciitis (chronic) PAIN:  Are you having pain? PAIN:  Are you having pain? Yes NPRS scale: 2/10 Pain location:low back, bil ankles, knees  PAIN TYPE: aching and dull Pain description: intermittent and aching  Aggravating factors: over doing it Relieving factors: seated foot massage device, ice bottles, massage gun   PRECAUTIONS: Fall and Other: Concussion   RED FLAGS: None   WEIGHT  BEARING RESTRICTIONS: No  FALLS:  Has patient fallen in last 6 months? Yes. Number of falls 1 fall 01/11/2024 see details above  LIVING ENVIRONMENT: Lives with: lives alone Lives in: House/apartment Stairs: Yes: Internal: 3.5 steps; on left going up Has following equipment at home: Single point cane, Shower bench, and Grab bars  OCCUPATION: Not currently working  PLOF: Independent, Independent with basic ADLs, Independent with household mobility without device, Independent with community mobility with device, Independent with gait, Independent with transfers, and Leisure: teaches painting classes   PATIENT GOALS: To get rid of straight cane; To be  able to walk up the steps normally; to be more mobile    NEXT MD VISIT: This week with PCP  OBJECTIVE:  Note: Objective measures were completed at Evaluation unless otherwise noted.  DIAGNOSTIC FINDINGS: Patient had imaging done on 01/11/2024 after fall. Head CT, CT C-spine, Knee, and shoulder imaging was normal no concerns.  PATIENT SURVEYS:  LEFS: 38/80 47.5  03/15/2024 LEFS 35/80 43.8%  04/02/2024 LEFS:50/80 62.5%   COGNITION: Overall cognitive status: Within functional limits for tasks assessed     SENSATION: WFL   POSTURE: rounded shoulders and forward head    LOWER EXTREMITY ROM: WFL bilateral    LOWER EXTREMITY MMT: Grossly 4/5 bilateral     FUNCTIONAL TESTS:  5 times sit to stand: 13.80 sec ; hands on knees; some pain in right hip Timed up and go (TUG): 12.48 sec no straight cane; 11.40 sec with straight cane  02/02/24 748 ft and RPE 6-7/10  - pressure starts but less than 2/10  03/01/2024 674ft ft no cane (antalgic gait) verbal cues for arm swing, ankle pain started at 2 mins RPE 5/10  03/11/2024 : 775ft no cane; ankle pain after 2 mins; no knee pain during walk (ankles were limiting factor)  03/15/2024 5STS 13.49 sec hands on knees; no pain; better performance than eval TUG: 11.22 sec no cane  04/02/2024 5STS 12.90 sec no UE support   GAIT: Assistive device utilized: Single point cane Comments: antalgic gait                                                                                                                                TREATMENT DATE:  Date: 04/20/24 Pt seen for aquatic therapy today.  Treatment took place in water 3.5-4.75 ft in depth at the [facility] pool. Temp of water was 85- lap pool.  Pt entered/exited the pool via stairs independently with bil rail.  - unsupported walking forward/ backward  - side stepping with arm add/abdct with rainbow -> yellow hand floats - suitcase carry with marching forward/ backward  with bil/single yellow hand floats at sides - marching backward/forward with reciprocal row on top of water with UE on yellow hand floats - TrA set with hollow noodle pull down to thighs 2x 10 - UE on noodle: alternating hamstring curls x 10;   -UE On  wall: heel/toe raises x 10; 3 way LE kicks (hip extension to toe touch) 2 x 5 - tandem gait forward/ backward, hands resting on top of water - vertical squats pushing single yellow hand float under water x 10 - R/L hamstring stretch with foot on 2nd step  [facility] Adult PT Treatment:                                              04/15/24  Nustep green machine x 9 min level 5 while discussing status and plan for session Wall gastroc/plantar fascia 30 sec hold 2x right/left Retro step as if going to 1/2 kneel 5x (good on left, painful on right knee)  Single leg Ts (needs single arm support) 10x right/left  4 inch forward step ups 10x right/left  with 2 hand support with palms Railing push ups 10x Sit to stand chair + cushion 5x; sit to stand chair + cushion holding 5# weight with overhead press 2 sets of 7 Discussed building up to floor transfers, including practicing components of it  Video resource how to get up of the floor (after a fall McGyver style)   Date: 04/13/24 Pt seen for aquatic therapy today.  Treatment took place in water 3.5-4.75 ft in depth at the [facility] pool. Temp of water was 91.  Pt entered/exited the pool via stairs independently with bil rail.  - unsupported walking forward/ backward  - side stepping with arm add/abdct with yellow -> rainbow hand floats - suitcase carry with marching forward/ backward with bil rainbow hand floats at sides - marching backward/forward with reciprocal row on top of water with UE on rainbow hand floats - UE on yellow hand floats:  3 way LE kicks (hip extension to toe touch) 2 x 4 each LE, (good balance challenge, cues to not kick LE too high); heel/toe raises x 10; hamstring curls  x 10, alternating LEs - vertical squats pushing single yellow hand float under water x 10 - tandem gait forward/ backward, hands resting on top of water -L stretch at stairs - R/L hamstring stretch with foot on 2nd step  [facility] Adult PT Treatment:                                              04/08/24  Nustep x 7 min level 3 Seated hamstring stretch 3 x 20 sec Seated QL stretch with peanut ball 4 x 15 sec TA activation + pelvic floor lift x 12 Seated rockerboard ankle (PF/DF) x 1.5 mins Standing hip abduction and extension with yellow loop around ankles x 8 bilateral  Standing chest press 4# DB 2 x 10  Forward T x 7 bilateral     04/02/24  Nustep x 6 min level 4 5 STS : 12.90 sec no UE support  LEFS: 50/80 62.5% Goal Assessment & progress made with therapy Forward T x5 each side Sit to stand 2 x 5 holding 10# DB  Lunge to BOSU x 10 bilateral (light UE support at barre) Single leg hurdle step overs holding 7# DB x 10 bilateral (UE support at barre)    [facility] Adult PT Treatment:  03/25/24  Nustep x 4 min level 4 Standing hamstring stretch at stair 2x 20sec bilateral  Sit to stand x 10 holding 7# DB Patient verbalized some leakage so cued her to do TA engagement + pelvic floor lift Sit to stand in staggered stance x 6 each LE  Lateral band walks with green TB x 2 laps of 15 ft Forward T x5 each side Step up on airex + knee drive x 10     PATIENT EDUCATION:  Education details:aquatic therapy exercise progressions/modifications  Person educated: Patient Education method: Explanation, Demonstration Education comprehension: verbalized understanding, returned demonstration, and needs further education  HOME EXERCISE PROGRAM: AQUATIC Access Code: [email or web address] URL: [email or web address] Date: 04/20/24 Prepared by: [facility] - [address]   Access Code: [email or web address] URL:  [email or web address] Date: 03/25/2024 Prepared by: Kristeen Sar  Exercises - Seated Long Arc Quad  - 2 x daily - 7 x weekly - 2 sets - 10 reps - 2 hold - Long Sitting Ankle Pumps  - 1 x daily - 7 x weekly - 2 sets - 10 reps - Seated Heel Raise  - 2 x daily - 7 x weekly - 2 sets - 10 reps - Seated Ankle Alphabet  - 2 x daily - 7 x weekly - 1 sets - 1 reps - Seated March  - 2 x daily - 7 x weekly - 2 sets - 10 reps - Seated Ankle Dorsiflexion AROM  - 2 x daily - 7 x weekly - 2 sets - 10 reps - Forward Step Up with Counter Support  - 1 x daily - 7 x weekly - 1 sets - 5 reps - Seated Hamstring Stretch  - 2 x daily - 7 x weekly - 1 sets - 3 reps - 20 hold - Seated Figure 4 Piriformis Stretch  - 2 x daily - 7 x weekly - 1 sets - 3 reps - 20 hold - Seated Hip External Rotation AROM  - 2 x daily - 7 x weekly - 1-2 sets - 10 reps - Side Stepping with Resistance at Feet  - 1 x daily - 7 x weekly - 3 sets - 5 reps - Seated Ankle Inversion with Resistance and Legs Crossed  - 1 x daily - 7 x weekly - 2 sets - 10 reps - Long Sitting Ankle Inversion with Anchored Resistance  - 1 x daily - 7 x weekly - 2 sets - 10 reps - Seated Ankle Eversion with Resistance  - 1 x daily - 7 x weekly - 2 sets - 10 reps - Forward T with Counter Support  - 1 x daily - 7 x weekly - 1-2 sets - 5-8 reps   Pt Education:  [email or web address]  ASSESSMENT:  CLINICAL IMPRESSION:  Pt was issued laminated aquatic therapy HEP and given instruction on exercises.  She reported some mild increase in back discomfort with suitcase carry with yellow hand floats; advised to reduce resistance when performing on own.  Pt tolerated all other exercises well with good form.  She has reached max potential in water; d/c from aquatic therapy today.     Eval: Patient is a 68 y.o. female who was seen today for physical therapy evaluation and  treatment for gait training. Nathanel presents with chronic bilateral foot and ankle that was re aggravated January 11, 2024 after a fall. She fell in her bathroom and she hit her head on the door threshold. She had imaging down and everything came back unremarkable.  She does have a concussion and she is limited in her able to bend and pick up items and she gets occasional sharp pains in her head. She denies frequent headaches. She goal is to initial a gym routine and stay mobile. Patient would benefit from some sessions of aquatic therapy to establish an aquatic program to perform on her own. Patient is motivated and wants to get better. Patient will benefit from skilled PT to address the below impairments and improve overall function.    OBJECTIVE IMPAIRMENTS: Abnormal gait, decreased balance, decreased endurance, difficulty walking, decreased ROM, decreased strength, increased muscle spasms, prosthetic dependency , and pain.   ACTIVITY LIMITATIONS: carrying, lifting, bending, standing, squatting, stairs, transfers, and locomotion level  PARTICIPATION LIMITATIONS: meal prep, cleaning, laundry, interpersonal relationship, shopping, community activity, and church  PERSONAL FACTORS: Age, Fitness, and Time since onset of injury/illness/exacerbation are also affecting patient's functional outcome.   REHAB POTENTIAL: Good  CLINICAL DECISION MAKING: Evolving/moderate complexity  EVALUATION COMPLEXITY: Moderate   GOALS: Goals reviewed with patient? Yes  SHORT TERM GOALS: Target date: 02/17/2024  Patient will be independent with initial HEP. Baseline:  Goal status: MET  2.  Patient will be able to participant a to establish a baseline for functional test. Baseline: will test next visit due to foot pain today (02/16/24) Goal status: MET 03/15/2024   3.  Patient will perform 5STS in < or = to 12 sec for improved functional mobility. Baseline:  Goal status: MET 04/02/2024   LONG TERM GOALS:  Target date: 04/23/2024  Patient will demonstrate independence in advanced HEP. Baseline:  Goal status:Partially met 04/20/24  2.  Patient will report > or = to 50% improvement in ankle and foot pain since starting PT. Baseline:  Goal status: IN PROGRESS (40%) 04/02/2024  3.  Patient will verbalize and demonstrate self-care strategies to manage pain including tissue mobility practices and change of position. Baseline:  Goal status: IN PROGRESS 04/02/2024  4.  Patient will score > or = to 48/80 on LEFS due to improved function. Baseline: 38/80 Goal status: MET  (50/80) 04/02/2024  5.  Patient will be able to stand long enough to wash dishes with < or = to 2/10 back pain. Baseline: has to take rests Goal status: MET 03/15/2024  6.  Patient will demonstrate proper posture when bending and lifting to prevent back injury. Baseline:  Goal status:IN PROGRESS 04/02/2024  7.  Patient will be able to negotiate stairs with a reciprocal pattern with LRAD to improve functional mobility. Baseline:  Goal status:IN PROGRESS 04/02/2024  8.  Patient will ambulate > or = to 879ft with LRAD during for improved community mobility. Baseline:  Goal status:IN PROGRESS 04/02/2024    PLAN:  PT FREQUENCY: 2x/week  PT DURATION: 6 weeks  PLANNED INTERVENTIONS: 97164- PT Re-evaluation, 97110-Therapeutic exercises, 97530- Therapeutic activity, 97112- Neuromuscular re-education, 97535- Self Care, [id number]- Manual therapy, [phone]- Gait training, [phone]- Canalith repositioning, [phone]- Aquatic Therapy, [phone]- Electrical stimulation (unattended), [phone]- Electrical stimulation (manual), [id number]- Vasopneumatic device, [id number]- Ultrasound, [id number]- Ionotophoresis 4mg /ml Dexamethasone , 79439 (1-2 muscles), [id number] (3+ muscles)- Dry Needling, Patient/Family education, Balance training, Stair training, Taping, Joint mobilization, Joint manipulation, Spinal manipulation, Spinal mobilization, Vestibular training, Cryotherapy, and  Moist heat  PLAN FOR NEXT SESSION: check progress towards goals to determine ERO vs readiness for discharge;  floor transfer; single leg balance; standing/ walking tolerance; LE strengthening; ankle strengthening & ROM; core strengthening   Delon Aquas, PTA 04/20/24 9:25 AM [facility] [phone]  [place], [place], [address] Phone: [phone]   Fax:  [phone]

## 2024-04-22 ENCOUNTER — Encounter: Payer: Self-pay | Admitting: Physical Therapy

## 2024-04-22 ENCOUNTER — Ambulatory Visit: Attending: Family Medicine | Admitting: Physical Therapy

## 2024-04-22 DIAGNOSIS — G8929 Other chronic pain: Secondary | ICD-10-CM | POA: Diagnosis present

## 2024-04-22 DIAGNOSIS — R293 Abnormal posture: Secondary | ICD-10-CM | POA: Diagnosis present

## 2024-04-22 DIAGNOSIS — R262 Difficulty in walking, not elsewhere classified: Secondary | ICD-10-CM | POA: Diagnosis present

## 2024-04-22 DIAGNOSIS — M5459 Other low back pain: Secondary | ICD-10-CM | POA: Insufficient documentation

## 2024-04-22 DIAGNOSIS — R252 Cramp and spasm: Secondary | ICD-10-CM | POA: Diagnosis present

## 2024-04-22 DIAGNOSIS — M6281 Muscle weakness (generalized): Secondary | ICD-10-CM | POA: Diagnosis present

## 2024-04-22 DIAGNOSIS — M25562 Pain in left knee: Secondary | ICD-10-CM | POA: Diagnosis present

## 2024-04-22 DIAGNOSIS — M25561 Pain in right knee: Secondary | ICD-10-CM | POA: Insufficient documentation

## 2024-04-22 NOTE — Therapy (Signed)
 OUTPATIENT PHYSICAL THERAPY LOWER EXTREMITY TREATMENT/ RE-CERTIFICATION   Patient Name: Chelsea Bell MRN: [id number] DOB:1956-03-11, 68 y.o., female Today's Date: 04/22/2024  END OF SESSION:  PT End of Session - 04/22/24 1512     Visit Number 34    Date for Recertification  06/03/24    Authorization Type BCBS Medicare    Progress Note Due on Visit 39    PT Start Time 1147    PT Stop Time 1226    PT Time Calculation (min) 39 min    Activity Tolerance Patient tolerated treatment well    Behavior During Therapy WFL for tasks assessed/performed             Past Medical History:  Diagnosis Date   Cancer (HCC)    endometrial cancer- surgery only   Concussion    GERD (gastroesophageal reflux disease)    Past Surgical History:  Procedure Laterality Date   ABDOMINAL HYSTERECTOMY     COLONOSCOPY WITH PROPOFOL  N/A 06/09/2014   Procedure: COLONOSCOPY WITH PROPOFOL ;  Surgeon: Lamar Donnald GAILS, MD;  Location: [facility];  Service: Endoscopy;  Laterality: N/A;   DILATION AND CURETTAGE OF UTERUS     LAPAROSCOPIC OVARIAN     diagnostic with 1 ovary removed , then Hysterectomy(endometrial cancer) with other ovary removed   TONSILLECTOMY     Patient Active Problem List   Diagnosis Date Noted   Overactive bladder 10/29/2023   SUI (stress urinary incontinence, female) 10/29/2023   Chest pain 10/07/2021    PCP: Marvine Rush, MD  REFERRING PROVIDER: Marvine Rush, MD  REFERRING DIAG: Rt 26.7 Gait training  THERAPY DIAG:  Chronic pain of both knees  Muscle weakness (generalized)  Other low back pain  Rationale for Evaluation and Treatment: Rehabilitation  ONSET DATE:  01/11/2024  SUBJECTIVE:   SUBJECTIVE STATEMENT:  Patient reports her ankles are really hurting today.   POOL ACCESS: (04/20/24) Pt joined [employer] for use of pool in [place] and gym in [place].   Eval: Patient presents with pain in her feet and ankles. June 22 she had a bad fall. She had stool  in her bathroom and she tripped over it and she hit her head on the door frame. She has multiple bruises on her head, knee, and ankle. Patient denies a brain bleed and any broken bones. She had a follow up with the orthopedic doctor and she has multiple appointments coming up. She has a concussion and does not get frequent headaches but she gets occasional twinges and sharp pains. She still get very fatigued and she wants to rest majority of the day. Her left knee is her bad knee. She does not use her straight cane in her house, but she used out in the community [employer], appointments etc). She sometimes uses the scooter when she grocery shopes. Her goal is to not use the straight cane. She feels she is too young to be using a straight cane.   PERTINENT HISTORY: Currently in pelvic therapy; hx cancer, B plantar fasciitis (chronic) PAIN:  Are you having pain? PAIN:  Are you having pain? Yes NPRS scale: 2/10 Pain location:low back, bil ankles, knees  PAIN TYPE: aching and dull Pain description: intermittent and aching  Aggravating factors: over doing it Relieving factors: seated foot massage device, ice bottles, massage gun   PRECAUTIONS: Fall and Other: Concussion   RED FLAGS: None   WEIGHT BEARING RESTRICTIONS: No  FALLS:  Has patient fallen in last 6 months? Yes. Number of falls 1  fall 01/11/2024 see details above  LIVING ENVIRONMENT: Lives with: lives alone Lives in: House/apartment Stairs: Yes: Internal: 3.5 steps; on left going up Has following equipment at home: Single point cane, Shower bench, and Grab bars  OCCUPATION: Not currently working  PLOF: Independent, Independent with basic ADLs, Independent with household mobility without device, Independent with community mobility with device, Independent with gait, Independent with transfers, and Leisure: teaches painting classes   PATIENT GOALS: To get rid of straight cane; To be able to walk up the steps normally; to be  more mobile    NEXT MD VISIT: This week with PCP  OBJECTIVE:  Note: Objective measures were completed at Evaluation unless otherwise noted.  DIAGNOSTIC FINDINGS: Patient had imaging done on 01/11/2024 after fall. Head CT, CT C-spine, Knee, and shoulder imaging was normal no concerns.  PATIENT SURVEYS:  LEFS: 38/80 47.5  03/15/2024 LEFS 35/80 43.8%  04/02/2024 LEFS:50/80 62.5%   COGNITION: Overall cognitive status: Within functional limits for tasks assessed     SENSATION: WFL   POSTURE: rounded shoulders and forward head    LOWER EXTREMITY ROM: WFL bilateral    LOWER EXTREMITY MMT: Grossly 4/5 bilateral     FUNCTIONAL TESTS:  5 times sit to stand: 13.80 sec ; hands on knees; some pain in right hip Timed up and go (TUG): 12.48 sec no straight cane; 11.40 sec with straight cane  02/02/24 748 ft and RPE 6-7/10  - pressure starts but less than 2/10  03/01/2024 691ft ft no cane (antalgic gait) verbal cues for arm swing, ankle pain started at 2 mins RPE 5/10  03/11/2024 : 733ft no cane; ankle pain after 2 mins; no knee pain during walk (ankles were limiting factor)  03/15/2024 5STS 13.49 sec hands on knees; no pain; better performance than eval TUG: 11.22 sec no cane  04/02/2024 5STS 12.90 sec no UE support   04/22/2024 : 851ft no cane; right ankle pain after 2 mins  GAIT: Assistive device utilized: Single point cane Comments: antalgic gait                                                                                                                                TREATMENT DATE:  04/22/24  Nustep green machine x 8 min level 5 while discussing status and plan for session Goal Assessment and discussion of plan for next 5 weeks : 843ft no cane; right ankle pain after 2 mins  Retro step as if going to 1/2 kneel 5x (good on left, painful on right knee)     Date: 04/20/24 Pt seen for aquatic therapy today.  Treatment took place in water 3.5-4.75 ft  in depth at the [facility] pool. Temp of water was 85- lap pool.  Pt entered/exited the pool via stairs independently with bil rail.  - unsupported walking forward/ backward  - side stepping with arm add/abdct with rainbow -> yellow hand floats - suitcase carry  with marching forward/ backward with bil/single yellow hand floats at sides - marching backward/forward with reciprocal row on top of water with UE on yellow hand floats - TrA set with hollow noodle pull down to thighs 2x 10 - UE on noodle: alternating hamstring curls x 10;   -UE On wall: heel/toe raises x 10; 3 way LE kicks (hip extension to toe touch) 2 x 5 - tandem gait forward/ backward, hands resting on top of water - vertical squats pushing single yellow hand float under water x 10 - R/L hamstring stretch with foot on 2nd step  [facility] Adult PT Treatment:                                              04/15/24  Nustep green machine x 9 min level 5 while discussing status and plan for session Wall gastroc/plantar fascia 30 sec hold 2x right/left Retro step as if going to 1/2 kneel 5x (good on left, painful on right knee)  Single leg Ts (needs single arm support) 10x right/left  4 inch forward step ups 10x right/left  with 2 hand support with palms Railing push ups 10x Sit to stand chair + cushion 5x; sit to stand chair + cushion holding 5# weight with overhead press 2 sets of 7 Discussed building up to floor transfers, including practicing components of it  Video resource how to get up of the floor (after a fall McGyver style)    PATIENT EDUCATION:  Education details:aquatic therapy exercise progressions/modifications  Person educated: Patient Education method: Programmer, multimedia, Demonstration Education comprehension: verbalized understanding, returned demonstration, and needs further education  HOME EXERCISE PROGRAM: AQUATIC Access Code: [email or web address] URL: [email or web address] Date: 04/20/24 Prepared by: [facility]  - Outpatient Rehab - [address]   Access Code: [email or web address] URL: [email or web address] Date: 03/25/2024 Prepared by: Kristeen Sar  Exercises - Seated Long Arc Quad  - 2 x daily - 7 x weekly - 2 sets - 10 reps - 2 hold - Long Sitting Ankle Pumps  - 1 x daily - 7 x weekly - 2 sets - 10 reps - Seated Heel Raise  - 2 x daily - 7 x weekly - 2 sets - 10 reps - Seated Ankle Alphabet  - 2 x daily - 7 x weekly - 1 sets - 1 reps - Seated March  - 2 x daily - 7 x weekly - 2 sets - 10 reps - Seated Ankle Dorsiflexion AROM  - 2 x daily - 7 x weekly - 2 sets - 10 reps - Forward Step Up with Counter Support  - 1 x daily - 7 x weekly - 1 sets - 5 reps - Seated Hamstring Stretch  - 2 x daily - 7 x weekly - 1 sets - 3 reps - 20 hold - Seated Figure 4 Piriformis Stretch  - 2 x daily - 7 x weekly - 1 sets - 3 reps - 20 hold - Seated Hip External Rotation AROM  - 2 x daily - 7 x weekly - 1-2 sets - 10 reps - Side Stepping with Resistance at Feet  - 1 x daily - 7 x weekly - 3 sets - 5 reps - Seated Ankle Inversion with Resistance and Legs Crossed  - 1 x daily - 7 x weekly - 2 sets - 10 reps - Long Sitting Ankle Inversion with Anchored Resistance  -  1 x daily - 7 x weekly - 2 sets - 10 reps - Seated Ankle Eversion with Resistance  - 1 x daily - 7 x weekly - 2 sets - 10 reps - Forward T with Counter Support  - 1 x daily - 7 x weekly - 1-2 sets - 5-8 reps   Pt Education:  [email or web address]  ASSESSMENT:  CLINICAL IMPRESSION:  Re-certification completed today. Nathanel has made good improvements with skilled therapy. She verbalized feeling 50% better since starting therapy. Recently, she has been under the weather so exercise intensity has decreased some due to patient's energy levels. She has recently gotten a Photographer and has been going to exercise classes and plays to continue aquatics on her own. Her  goal is to be able to negotiate stairs with a reciprocal pattern, walk with no AD, and transfer from the floor with no assistance. Patient ambulated with an antalgic gait which is secondary due hip weakness, pain in bilateral ankles and left knee pain. Provided cues to encourage proper mechanics. Patient is highly motivated and wants to return to her normal. She has met majority of her goals, but would benefit from continued therapy to meet the remaining of her goals, and continue forming an established exercise program.    Eval: Patient is a 68 y.o. female who was seen today for physical therapy evaluation and treatment for gait training. Nathanel presents with chronic bilateral foot and ankle that was re aggravated January 11, 2024 after a fall. She fell in her bathroom and she hit her head on the door threshold. She had imaging down and everything came back unremarkable. She does have a concussion and she is limited in her able to bend and pick up items and she gets occasional sharp pains in her head. She denies frequent headaches. She goal is to initial a gym routine and stay mobile. Patient would benefit from some sessions of aquatic therapy to establish an aquatic program to perform on her own. Patient is motivated and wants to get better. Patient will benefit from skilled PT to address the below impairments and improve overall function.    OBJECTIVE IMPAIRMENTS: Abnormal gait, decreased balance, decreased endurance, difficulty walking, decreased ROM, decreased strength, increased muscle spasms, prosthetic dependency , and pain.   ACTIVITY LIMITATIONS: carrying, lifting, bending, standing, squatting, stairs, transfers, and locomotion level  PARTICIPATION LIMITATIONS: meal prep, cleaning, laundry, interpersonal relationship, shopping, community activity, and church  PERSONAL FACTORS: Age, Fitness, and Time since onset of injury/illness/exacerbation are also affecting patient's functional outcome.    REHAB POTENTIAL: Good  CLINICAL DECISION MAKING: Evolving/moderate complexity  EVALUATION COMPLEXITY: Moderate   GOALS: Goals reviewed with patient? Yes  SHORT TERM GOALS: Target date: 02/17/2024  Patient will be independent with initial HEP. Baseline:  Goal status: MET  2.  Patient will be able to participant a to establish a baseline for functional test. Baseline: will test next visit due to foot pain today (02/16/24) Goal status: MET 03/15/2024   3.  Patient will perform 5STS in < or = to 12 sec for improved functional mobility. Baseline:  Goal status: MET 04/02/2024   LONG TERM GOALS: Target date: 06/03/2024  Patient will demonstrate independence in advanced HEP. Baseline:  Goal status:In progress 04/22/2024  2.  Patient will report > or = to 50% improvement in ankle and foot pain since starting PT. Baseline:  Goal status: MET (50%) 04/22/2024  3.  Patient will verbalize and demonstrate self-care strategies to manage pain including tissue  mobility practices and change of position. Baseline:  Goal status: MET 04/22/2024  4.  Patient will score > or = to 48/80 on LEFS due to improved function. Baseline: 38/80 Goal status: MET  (50/80) 04/02/2024  5.  Patient will be able to stand long enough to wash dishes with < or = to 2/10 back pain. Baseline: has to take rests Goal status: MET 03/15/2024  6.  Patient will demonstrate proper posture when bending and lifting to prevent back injury. Baseline:  Goal status:IN PROGRESS 04/22/2024  7.  Patient will be able to negotiate stairs with a reciprocal pattern with LRAD to improve functional mobility. Baseline:  Goal status:IN PROGRESS (still one step at a time but it is better) 04/22/2024  8.  Patient will ambulate > or = to 83ft with LRAD during for improved community mobility. Baseline:  Goal status:MET ( 856ft) 04/22/2024  9.  Patient will be able to perform a floor transfer safely using proper body mechanics  to enhance safety with daily activities and reduce falls risk. Baseline:  Goal status:NEW    PLAN:  PT FREQUENCY: 1-2x/week  PT DURATION: 6 weeks  PLANNED INTERVENTIONS: [id number]- PT Re-evaluation, 97110-Therapeutic exercises, 97530- Therapeutic activity, 97112- Neuromuscular re-education, 97535- Self Care, [id number]- Manual therapy, [phone]- Gait training, [phone]- Canalith repositioning, [id number]- Aquatic Therapy, [phone]- Electrical stimulation (unattended), [phone]- Electrical stimulation (manual), [id number]- Vasopneumatic device, [id number]- Ultrasound, [id number]- Ionotophoresis 4mg /ml Dexamethasone , [id number] (1-2 muscles), [id number] (3+ muscles)- Dry Needling, Patient/Family education, Balance training, Stair training, Taping, Joint mobilization, Joint manipulation, Spinal manipulation, Spinal mobilization, Vestibular training, Cryotherapy, and Moist heat  PLAN FOR NEXT SESSION:  floor transfer; single leg balance; standing/ walking tolerance; LE strengthening; ankle strengthening & ROM; core strengthening   Kristeen Sar, PT 04/22/24 3:13 PM

## 2024-05-04 ENCOUNTER — Ambulatory Visit

## 2024-05-04 DIAGNOSIS — G8929 Other chronic pain: Secondary | ICD-10-CM

## 2024-05-04 DIAGNOSIS — M25561 Pain in right knee: Secondary | ICD-10-CM | POA: Diagnosis not present

## 2024-05-04 DIAGNOSIS — M6281 Muscle weakness (generalized): Secondary | ICD-10-CM

## 2024-05-04 DIAGNOSIS — R262 Difficulty in walking, not elsewhere classified: Secondary | ICD-10-CM

## 2024-05-04 DIAGNOSIS — M5459 Other low back pain: Secondary | ICD-10-CM

## 2024-05-04 DIAGNOSIS — R252 Cramp and spasm: Secondary | ICD-10-CM

## 2024-05-04 DIAGNOSIS — R293 Abnormal posture: Secondary | ICD-10-CM

## 2024-05-04 NOTE — Therapy (Addendum)
 OUTPATIENT PHYSICAL THERAPY LOWER EXTREMITY TREATMENT/ RE-CERTIFICATION   Patient Name: Chelsea Bell MRN: [id number] DOB:Apr 02, 1956, 68 y.o., female Today's Date: 05/04/2024  END OF SESSION:  PT End of Session - 05/04/24 0944     Visit Number 35    Number of Visits 10    Date for Recertification  06/03/24    Authorization Type BCBS Medicare    Progress Note Due on Visit 39    PT Start Time 0940    PT Stop Time 1030    PT Time Calculation (min) 50 min    Activity Tolerance Patient tolerated treatment well    Behavior During Therapy WFL for tasks assessed/performed             Past Medical History:  Diagnosis Date   Cancer (HCC)    endometrial cancer- surgery only   Concussion    GERD (gastroesophageal reflux disease)    Past Surgical History:  Procedure Laterality Date   ABDOMINAL HYSTERECTOMY     COLONOSCOPY WITH PROPOFOL  N/A 06/09/2014   Procedure: COLONOSCOPY WITH PROPOFOL ;  Surgeon: Lamar Donnald GAILS, MD;  Location: [facility];  Service: Endoscopy;  Laterality: N/A;   DILATION AND CURETTAGE OF UTERUS     LAPAROSCOPIC OVARIAN     diagnostic with 1 ovary removed , then Hysterectomy(endometrial cancer) with other ovary removed   TONSILLECTOMY     Patient Active Problem List   Diagnosis Date Noted   Overactive bladder 10/29/2023   SUI (stress urinary incontinence, female) 10/29/2023   Chest pain 10/07/2021    PCP: Marvine Rush, MD  REFERRING PROVIDER: Marvine Rush, MD  REFERRING DIAG: Rt 26.7 Gait training  THERAPY DIAG:  Chronic pain of both knees  Muscle weakness (generalized)  Difficulty in walking, not elsewhere classified  Cramp and spasm  Other low back pain  Abnormal posture  Rationale for Evaluation and Treatment: Rehabilitation  ONSET DATE:  01/11/2024  SUBJECTIVE:   SUBJECTIVE STATEMENT:  Patient reports her ankles are still hurting but not bad, my knees are hurting but not bad.  Getting up and down is easier and  some days my walking is easier    POOL ACCESS: (04/20/24) Pt joined [employer] for use of pool in [place] and gym in [place].   Eval: Patient presents with pain in her feet and ankles. June 22 she had a bad fall. She had stool in her bathroom and she tripped over it and she hit her head on the door frame. She has multiple bruises on her head, knee, and ankle. Patient denies a brain bleed and any broken bones. She had a follow up with the orthopedic doctor and she has multiple appointments coming up. She has a concussion and does not get frequent headaches but she gets occasional twinges and sharp pains. She still get very fatigued and she wants to rest majority of the day. Her left knee is her bad knee. She does not use her straight cane in her house, but she used out in the community [employer], appointments etc). She sometimes uses the scooter when she grocery shopes. Her goal is to not use the straight cane. She feels she is too young to be using a straight cane.   PERTINENT HISTORY: Currently in pelvic therapy; hx cancer, B plantar fasciitis (chronic) PAIN:  Are you having pain? PAIN:  Are you having pain? Yes NPRS scale: 2/10 Pain location:low back, bil ankles, knees  PAIN TYPE: aching and dull Pain description: intermittent and aching  Aggravating factors: over  doing it Relieving factors: seated foot massage device, ice bottles, massage gun   PRECAUTIONS: Fall and Other: Concussion   RED FLAGS: None   WEIGHT BEARING RESTRICTIONS: No  FALLS:  Has patient fallen in last 6 months? Yes. Number of falls 1 fall 01/11/2024 see details above  LIVING ENVIRONMENT: Lives with: lives alone Lives in: House/apartment Stairs: Yes: Internal: 3.5 steps; on left going up Has following equipment at home: Single point cane, Shower bench, and Grab bars  OCCUPATION: Not currently working  PLOF: Independent, Independent with basic ADLs, Independent with household mobility without device,  Independent with community mobility with device, Independent with gait, Independent with transfers, and Leisure: teaches painting classes   PATIENT GOALS: To get rid of straight cane; To be able to walk up the steps normally; to be more mobile    NEXT MD VISIT: This week with PCP  OBJECTIVE:  Note: Objective measures were completed at Evaluation unless otherwise noted.  DIAGNOSTIC FINDINGS: Patient had imaging done on 01/11/2024 after fall. Head CT, CT C-spine, Knee, and shoulder imaging was normal no concerns.  PATIENT SURVEYS:  LEFS: 38/80 47.5  03/15/2024 LEFS 35/80 43.8%  04/02/2024 LEFS:50/80 62.5%   COGNITION: Overall cognitive status: Within functional limits for tasks assessed     SENSATION: WFL   POSTURE: rounded shoulders and forward head    LOWER EXTREMITY ROM: WFL bilateral    LOWER EXTREMITY MMT: Grossly 4/5 bilateral     FUNCTIONAL TESTS:  5 times sit to stand: 13.80 sec ; hands on knees; some pain in right hip Timed up and go (TUG): 12.48 sec no straight cane; 11.40 sec with straight cane  02/02/24 748 ft and RPE 6-7/10  - pressure starts but less than 2/10  03/01/2024 677ft ft no cane (antalgic gait) verbal cues for arm swing, ankle pain started at 2 mins RPE 5/10  03/11/2024 : 726ft no cane; ankle pain after 2 mins; no knee pain during walk (ankles were limiting factor)  03/15/2024 5STS 13.49 sec hands on knees; no pain; better performance than eval TUG: 11.22 sec no cane  04/02/2024 5STS 12.90 sec no UE support   04/22/2024 : 842ft no cane; right ankle pain after 2 mins  GAIT: Assistive device utilized: Single point cane Comments: antalgic gait                                                                                                                                TREATMENT DATE:  05/04/24 (patient 10 min late for appt) Nustep blue machine x 8 min level 5 while discussing status and plan for next few sessions Seated clam x  20 with yellow loop  Lateral band walks with yellow loop x 5 laps LAQ with 5 lbs 2 x 10 with isometric hip adduction using purple ball Step up on 4 step 2 x 10 each LE Retro step on 2 2 x 10 using hand rail  Education on falling in public or at home and how to negotiate to a higher surface or chair.  Safe ways for others to help or assist to avoid injury to the patient or the person assisting.  Discussed avoiding getting down on purpose frequently when strength is not appropriate enough to get back up. (Risk of injury)  04/22/24  Nustep green machine x 8 min level 5 while discussing status and plan for session Goal Assessment and discussion of plan for next 5 weeks : 818ft no cane; right ankle pain after 2 mins  Retro step as if going to 1/2 kneel 5x (good on left, painful on right knee)     Date: 04/20/24 Pt seen for aquatic therapy today.  Treatment took place in water 3.5-4.75 ft in depth at the [facility] pool. Temp of water was 85- lap pool.  Pt entered/exited the pool via stairs independently with bil rail.  - unsupported walking forward/ backward  - side stepping with arm add/abdct with rainbow -> yellow hand floats - suitcase carry with marching forward/ backward with bil/single yellow hand floats at sides - marching backward/forward with reciprocal row on top of water with UE on yellow hand floats - TrA set with hollow noodle pull down to thighs 2x 10 - UE on noodle: alternating hamstring curls x 10;   -UE On wall: heel/toe raises x 10; 3 way LE kicks (hip extension to toe touch) 2 x 5 - tandem gait forward/ backward, hands resting on top of water - vertical squats pushing single yellow hand float under water x 10 - R/L hamstring stretch with foot on 2nd step  [facility] Adult PT Treatment:                                              04/15/24  Nustep green machine x 9 min level 5 while discussing status and plan for session Wall gastroc/plantar fascia 30 sec hold 2x  right/left Retro step as if going to 1/2 kneel 5x (good on left, painful on right knee)  Single leg Ts (needs single arm support) 10x right/left  4 inch forward step ups 10x right/left  with 2 hand support with palms Railing push ups 10x Sit to stand chair + cushion 5x; sit to stand chair + cushion holding 5# weight with overhead press 2 sets of 7 Discussed building up to floor transfers, including practicing components of it  Video resource how to get up of the floor (after a fall McGyver style)    PATIENT EDUCATION:  Education details:aquatic therapy exercise progressions/modifications  Person educated: Patient Education method: Programmer, multimedia, Demonstration Education comprehension: verbalized understanding, returned demonstration, and needs further education  HOME EXERCISE PROGRAM: AQUATIC Access Code: [email or web address] URL: [email or web address] Date: 04/20/24 Prepared by: [facility] [address]   Access Code: [email or web address] URL: [email or web address] Date: 03/25/2024 Prepared by: Kristeen Sar  Exercises - Seated Long Arc Quad  - 2 x daily - 7 x weekly - 2 sets - 10 reps - 2 hold - Long Sitting Ankle Pumps  - 1 x daily - 7 x weekly - 2 sets - 10 reps - Seated Heel Raise  - 2 x daily - 7 x weekly - 2 sets - 10 reps - Seated Ankle Alphabet  - 2 x daily - 7 x weekly - 1 sets - 1 reps - Seated March  -  2 x daily - 7 x weekly - 2 sets - 10 reps - Seated Ankle Dorsiflexion AROM  - 2 x daily - 7 x weekly - 2 sets - 10 reps - Forward Step Up with Counter Support  - 1 x daily - 7 x weekly - 1 sets - 5 reps - Seated Hamstring Stretch  - 2 x daily - 7 x weekly - 1 sets - 3 reps - 20 hold - Seated Figure 4 Piriformis Stretch  - 2 x daily - 7 x weekly - 1 sets - 3 reps - 20 hold - Seated Hip External Rotation AROM  - 2 x daily - 7 x weekly - 1-2 sets - 10 reps - Side Stepping with Resistance at Feet  - 1 x daily - 7 x weekly - 3 sets - 5 reps - Seated  Ankle Inversion with Resistance and Legs Crossed  - 1 x daily - 7 x weekly - 2 sets - 10 reps - Long Sitting Ankle Inversion with Anchored Resistance  - 1 x daily - 7 x weekly - 2 sets - 10 reps - Seated Ankle Eversion with Resistance  - 1 x daily - 7 x weekly - 2 sets - 10 reps - Forward T with Counter Support  - 1 x daily - 7 x weekly - 1-2 sets - 5-8 reps   Pt Education:  [email or web address]  ASSESSMENT:  CLINICAL IMPRESSION:  Katerin was 10 min late for appt after going to bathroom so treatment time was limited but we did have time at end of session to discuss/educate on falls in public and at home and how to navigate to a higher level and proper/safe ways for others to assist someone when there is a fall.  She was able to do all tasks with minimal pain and moderate fatigue.  She is well motivated and compliant.  She would benefit from continuing skilled PT for these next few visits.      Eval: Patient is a 68 y.o. female who was seen today for physical therapy evaluation and treatment for gait training. Nathanel presents with chronic bilateral foot and ankle that was re aggravated January 11, 2024 after a fall. She fell in her bathroom and she hit her head on the door threshold. She had imaging down and everything came back unremarkable. She does have a concussion and she is limited in her able to bend and pick up items and she gets occasional sharp pains in her head. She denies frequent headaches. She goal is to initial a gym routine and stay mobile. Patient would benefit from some sessions of aquatic therapy to establish an aquatic program to perform on her own. Patient is motivated and wants to get better. Patient will benefit from skilled PT to address the below impairments and improve overall function.    OBJECTIVE IMPAIRMENTS: Abnormal gait, decreased balance, decreased endurance, difficulty walking,  decreased ROM, decreased strength, increased muscle spasms, prosthetic dependency , and pain.   ACTIVITY LIMITATIONS: carrying, lifting, bending, standing, squatting, stairs, transfers, and locomotion level  PARTICIPATION LIMITATIONS: meal prep, cleaning, laundry, interpersonal relationship, shopping, community activity, and church  PERSONAL FACTORS: Age, Fitness, and Time since onset of injury/illness/exacerbation are also affecting patient's functional outcome.   REHAB POTENTIAL: Good  CLINICAL DECISION MAKING: Evolving/moderate complexity  EVALUATION COMPLEXITY: Moderate   GOALS: Goals reviewed with patient? Yes  SHORT TERM GOALS: Target date: 02/17/2024  Patient will be independent with initial HEP. Baseline:  Goal status: MET  2.  Patient will be able to participant a to establish a baseline for functional test. Baseline: will test next visit due to foot pain today (02/16/24) Goal status: MET 03/15/2024   3.  Patient will perform 5STS in < or = to 12 sec for improved functional mobility. Baseline:  Goal status: MET 04/02/2024   LONG TERM GOALS: Target date: 06/03/2024  Patient will demonstrate independence in advanced HEP. Baseline:  Goal status:In progress 04/22/2024  2.  Patient will report > or = to 50% improvement in ankle and foot pain since starting PT. Baseline:  Goal status: MET (50%) 04/22/2024  3.  Patient will verbalize and demonstrate self-care strategies to manage pain including tissue mobility practices and change of position. Baseline:  Goal status: MET 04/22/2024  4.  Patient will score > or = to 48/80 on LEFS due to improved function. Baseline: 38/80 Goal status: MET  (50/80) 04/02/2024  5.  Patient will be able to stand long enough to wash dishes with < or = to 2/10 back pain. Baseline: has to take rests Goal status: MET 03/15/2024  6.  Patient will demonstrate proper posture when bending and lifting to prevent back injury. Baseline:  Goal  status:IN PROGRESS 04/22/2024  7.  Patient will be able to negotiate stairs with a reciprocal pattern with LRAD to improve functional mobility. Baseline:  Goal status:IN PROGRESS (still one step at a time but it is better) 04/22/2024  8.  Patient will ambulate > or = to 838ft with LRAD during for improved community mobility. Baseline:  Goal status:MET ( 866ft) 04/22/2024  9.  Patient will be able to perform a floor transfer safely using proper body mechanics to enhance safety with daily activities and reduce falls risk. Baseline:  Goal status:NEW    PLAN:  PT FREQUENCY: 1-2x/week  PT DURATION: 6 weeks  PLANNED INTERVENTIONS: 97164- PT Re-evaluation, 97110-Therapeutic exercises, 97530- Therapeutic activity, [id number]- Neuromuscular re-education, 97535- Self Care, [id number]- Manual therapy, [phone]- Gait training, [phone]- Canalith repositioning, [id number]- Aquatic Therapy, [phone]- Electrical stimulation (unattended), [phone]- Electrical stimulation (manual), [id number]- Vasopneumatic device, [id number]- Ultrasound, [id number]- Ionotophoresis 4mg /ml Dexamethasone , 79439 (1-2 muscles), [id number] (3+ muscles)- Dry Needling, Patient/Family education, Balance training, Stair training, Taping, Joint mobilization, Joint manipulation, Spinal manipulation, Spinal mobilization, Vestibular training, Cryotherapy, and Moist heat  PLAN FOR NEXT SESSION:  Patient would like to try floor transfer; single leg balance; standing/ walking tolerance; LE strengthening; ankle strengthening & ROM; core strengthening   Jamie Hafford B. Kimberely Mccannon, PT 05/04/24 10:50 AM [facility] [address], [address] [place], [place] [address] Phone # [phone] Fax [phone]

## 2024-05-11 ENCOUNTER — Ambulatory Visit (INDEPENDENT_AMBULATORY_CARE_PROVIDER_SITE_OTHER)

## 2024-05-11 ENCOUNTER — Ambulatory Visit (INDEPENDENT_AMBULATORY_CARE_PROVIDER_SITE_OTHER): Admitting: Family Medicine

## 2024-05-11 VITALS — BP 138/86 | HR 66 | Ht 66.0 in | Wt 307.0 lb

## 2024-05-11 DIAGNOSIS — M25572 Pain in left ankle and joints of left foot: Secondary | ICD-10-CM

## 2024-05-11 DIAGNOSIS — M25571 Pain in right ankle and joints of right foot: Secondary | ICD-10-CM | POA: Diagnosis not present

## 2024-05-11 DIAGNOSIS — G8929 Other chronic pain: Secondary | ICD-10-CM | POA: Diagnosis not present

## 2024-05-11 NOTE — Progress Notes (Signed)
   LILLETTE Ileana Collet, PhD, LAT, ATC acting as a scribe for Artist Lloyd, MD.  Chelsea Bell,  is a 68 y.o. female who presents for f/u concussion. Pt was last seen by Dr. Lloyd on 04/12/24 and was advised to cont advancing activities and consider joining a gym.   Today, pt reports she is feeling better, improving daily. Initially she was having pain in her head w/ increased activity. She notes now she is having short-lived HA, in varying locations. Slight vision disturbance. She also notes she joined [employer] and eating primarily but also in [place].  She has a exercise program to do.  Additionally she notes bilateral ankle pain bilaterally.  This is a chronic ongoing issue.  She has gotten evaluation at the good feet store and has good over-the-counter insoles and good shoes (Brooks adrenaline).   Dx imaging: 01/11/24 Head CT   Injury date: 01/11/24  Pertinent review of systems: No fevers or chills  Relevant historical information: Overactive bladder   Exam:  BP 138/86   Pulse 66   Ht 5' 6 (1.676 m)   Wt (!) 307 lb (139.3 kg)   SpO2 100%   BMI 49.55 kg/m  General: Well Developed, well nourished, and in no acute distress.   MSK: Ankles bilaterally significant pronation.  Mildly tender palpation lateral ankle joint line.  Intact strength.    Lab and Radiology Results  X-ray images bilateral ankles obtained today personally and independently interpreted.  Right ankle: Mild degenerative changes.  No acute fractures.  Left ankle: Mild degenerative changes.  No acute fractures.  Await formal radiology review   Assessment and Plan: 68 y.o. female with improving concussion symptoms.  Plan to continue and progress with her home exercise program.  Bilateral ankle pain due to lateral impingement due to pronation.  Plan for home exercise program.  Recommend also Voltaren gel and compression sleeve.  Continue appropriate footwear.  Consider injection if  needed.   PDMP not reviewed this encounter. Orders Placed This Encounter  Procedures   DG Ankle Complete Left    Standing Status:   Future    Number of Occurrences:   1    Expiration Date:   05/11/2025    Reason for Exam (SYMPTOM  OR DIAGNOSIS REQUIRED):   bilateral ankle pain    Preferred imaging location?:   [facility] Ankle Complete Right    Standing Status:   Future    Number of Occurrences:   1    Expiration Date:   05/11/2025    Reason for Exam (SYMPTOM  OR DIAGNOSIS REQUIRED):   bilateral ankle pain    Preferred imaging location?:   [facility]   No orders of the defined types were placed in this encounter.    Discussed warning signs or symptoms. Please see discharge instructions. Patient expresses understanding.   The above documentation has been reviewed and is accurate and complete Artist Lloyd, M.D.

## 2024-05-11 NOTE — Patient Instructions (Addendum)
 Thank you for coming in today.   Please get an Xray today before you leave   I recommend you obtained a compression sleeve to help with your joint problems. There are many options on the market however I recommend obtaining a Full Ankle Body Helix compression sleeve.  You can find information (including how to appropriate measure yourself for sizing) can be found at www.Body GrandRap[employer]Wifi.ch.  Many of these products are health savings account (HSA) eligible.   You can use the compression sleeve at any time throughout the day but is most important to use while being active as well as for 2 hours post-activity.   It is appropriate to ice following activity with the compression sleeve in place.   Please use Voltaren gel (Generic Diclofenac Gel) up to 4x daily for pain as needed.  This is available over-the-counter as both the name brand Voltaren gel and the generic diclofenac gel.   Check back as needed

## 2024-05-12 ENCOUNTER — Ambulatory Visit: Admitting: Physical Therapy

## 2024-05-12 ENCOUNTER — Encounter: Payer: Self-pay | Admitting: Physical Therapy

## 2024-05-12 DIAGNOSIS — G8929 Other chronic pain: Secondary | ICD-10-CM

## 2024-05-12 DIAGNOSIS — R262 Difficulty in walking, not elsewhere classified: Secondary | ICD-10-CM

## 2024-05-12 DIAGNOSIS — M6281 Muscle weakness (generalized): Secondary | ICD-10-CM

## 2024-05-12 DIAGNOSIS — M5459 Other low back pain: Secondary | ICD-10-CM

## 2024-05-12 DIAGNOSIS — M25561 Pain in right knee: Secondary | ICD-10-CM | POA: Diagnosis not present

## 2024-05-12 NOTE — Therapy (Signed)
 OUTPATIENT PHYSICAL THERAPY LOWER EXTREMITY TREATMENT  Patient Name: Chelsea Bell MRN: [id number] DOB:11-15-1955, 68 y.o., female Today's Date: 05/12/2024  END OF SESSION:  PT End of Session - 05/12/24 1459     Visit Number 36    Date for Recertification  06/03/24    Authorization Type BCBS Medicare    Progress Note Due on Visit 39    PT Start Time 0200    PT Stop Time 0250    PT Time Calculation (min) 50 min    Activity Tolerance Patient tolerated treatment well    Behavior During Therapy WFL for tasks assessed/performed              Past Medical History:  Diagnosis Date   Cancer (HCC)    endometrial cancer- surgery only   Concussion    GERD (gastroesophageal reflux disease)    Past Surgical History:  Procedure Laterality Date   ABDOMINAL HYSTERECTOMY     COLONOSCOPY WITH PROPOFOL  N/A 06/09/2014   Procedure: COLONOSCOPY WITH PROPOFOL ;  Surgeon: Lamar Donnald GAILS, MD;  Location: [facility];  Service: Endoscopy;  Laterality: N/A;   DILATION AND CURETTAGE OF UTERUS     LAPAROSCOPIC OVARIAN     diagnostic with 1 ovary removed , then Hysterectomy(endometrial cancer) with other ovary removed   TONSILLECTOMY     Patient Active Problem List   Diagnosis Date Noted   Overactive bladder 10/29/2023   SUI (stress urinary incontinence, female) 10/29/2023   Chest pain 10/07/2021    PCP: Marvine Rush, MD  REFERRING PROVIDER: Marvine Rush, MD  REFERRING DIAG: Rt 26.7 Gait training  THERAPY DIAG:  Chronic pain of both knees  Muscle weakness (generalized)  Difficulty in walking, not elsewhere classified  Other low back pain  Rationale for Evaluation and Treatment: Rehabilitation  ONSET DATE:  01/11/2024  SUBJECTIVE:   SUBJECTIVE STATEMENT:  My ankles are really painful today. It was worse in the morning but it has subsided a little. I had a follow up appointment with sports med MD yesterday.  POOL ACCESS: (04/20/24) Pt joined [employer] for use of pool  in [place] and gym in [place].   Eval: Patient presents with pain in her feet and ankles. June 22 she had a bad fall. She had stool in her bathroom and she tripped over it and she hit her head on the door frame. She has multiple bruises on her head, knee, and ankle. Patient denies a brain bleed and any broken bones. She had a follow up with the orthopedic doctor and she has multiple appointments coming up. She has a concussion and does not get frequent headaches but she gets occasional twinges and sharp pains. She still get very fatigued and she wants to rest majority of the day. Her left knee is her bad knee. She does not use her straight cane in her house, but she used out in the community [employer], appointments etc). She sometimes uses the scooter when she grocery shopes. Her goal is to not use the straight cane. She feels she is too young to be using a straight cane.   PERTINENT HISTORY: Currently in pelvic therapy; hx cancer, B plantar fasciitis (chronic) PAIN:  Are you having pain? PAIN:  Are you having pain? Yes NPRS scale: 2/10 Pain location:low back, bil ankles, knees  PAIN TYPE: aching and dull Pain description: intermittent and aching  Aggravating factors: over doing it Relieving factors: seated foot massage device, ice bottles, massage gun   PRECAUTIONS: Fall and Other: Concussion  RED FLAGS: None   WEIGHT BEARING RESTRICTIONS: No  FALLS:  Has patient fallen in last 6 months? Yes. Number of falls 1 fall 01/11/2024 see details above  LIVING ENVIRONMENT: Lives with: lives alone Lives in: House/apartment Stairs: Yes: Internal: 3.5 steps; on left going up Has following equipment at home: Single point cane, Shower bench, and Grab bars  OCCUPATION: Not currently working  PLOF: Independent, Independent with basic ADLs, Independent with household mobility without device, Independent with community mobility with device, Independent with gait, Independent with transfers,  and Leisure: teaches painting classes   PATIENT GOALS: To get rid of straight cane; To be able to walk up the steps normally; to be more mobile    NEXT MD VISIT: This week with PCP  OBJECTIVE:  Note: Objective measures were completed at Evaluation unless otherwise noted.  DIAGNOSTIC FINDINGS: Patient had imaging done on 01/11/2024 after fall. Head CT, CT C-spine, Knee, and shoulder imaging was normal no concerns.  PATIENT SURVEYS:  LEFS: 38/80 47.5  03/15/2024 LEFS 35/80 43.8%  04/02/2024 LEFS:50/80 62.5%   COGNITION: Overall cognitive status: Within functional limits for tasks assessed     SENSATION: WFL   POSTURE: rounded shoulders and forward head    LOWER EXTREMITY ROM: WFL bilateral    LOWER EXTREMITY MMT: Grossly 4/5 bilateral     FUNCTIONAL TESTS:  5 times sit to stand: 13.80 sec ; hands on knees; some pain in right hip Timed up and go (TUG): 12.48 sec no straight cane; 11.40 sec with straight cane  02/02/24 748 ft and RPE 6-7/10  - pressure starts but less than 2/10  03/01/2024 656ft ft no cane (antalgic gait) verbal cues for arm swing, ankle pain started at 2 mins RPE 5/10  03/11/2024 : 761ft no cane; ankle pain after 2 mins; no knee pain during walk (ankles were limiting factor)  03/15/2024 5STS 13.49 sec hands on knees; no pain; better performance than eval TUG: 11.22 sec no cane  04/02/2024 5STS 12.90 sec no UE support   04/22/2024 : 827ft no cane; right ankle pain after 2 mins  GAIT: Assistive device utilized: Single point cane Comments: antalgic gait                                                                                                                                TREATMENT DATE:  05/12/24  Nustep blue machine x 6 min level 5 while discussing status and plan for next few sessions Measurements for ankle compression socks Dr. Joane recommended Floor transfer : attempted without using UE support but patient was unable to  stand from a tall kneeling position. Demonstrated exercises to help single leg balance to improve this. Patient was able to independently transfer using plinth has a support surface. Lengthy discussion on safety after a fall and how to recover.  Seated heel raise 7# DB 2 x 10 bilateral  Review and update of HEP  05/04/24 (patient 10 min late for appt) Nustep blue machine x 8 min level 5 while discussing status and plan for next few sessions Seated clam x 20 with yellow loop  Lateral band walks with yellow loop x 5 laps LAQ with 5 lbs 2 x 10 with isometric hip adduction using purple ball Step up on 4 step 2 x 10 each LE Retro step on 2 2 x 10 using hand rail  Education on falling in public or at home and how to negotiate to a higher surface or chair.  Safe ways for others to help or assist to avoid injury to the patient or the person assisting.  Discussed avoiding getting down on purpose frequently when strength is not appropriate enough to get back up. (Risk of injury)  04/22/24  Nustep green machine x 8 min level 5 while discussing status and plan for session Goal Assessment and discussion of plan for next 5 weeks : 867ft no cane; right ankle pain after 2 mins  Retro step as if going to 1/2 kneel 5x (good on left, painful on right knee)     Date: 04/20/24 Pt seen for aquatic therapy today.  Treatment took place in water 3.5-4.75 ft in depth at the [facility] pool. Temp of water was 85- lap pool.  Pt entered/exited the pool via stairs independently with bil rail.  - unsupported walking forward/ backward  - side stepping with arm add/abdct with rainbow -> yellow hand floats - suitcase carry with marching forward/ backward with bil/single yellow hand floats at sides - marching backward/forward with reciprocal row on top of water with UE on yellow hand floats - TrA set with hollow noodle pull down to thighs 2x 10 - UE on noodle: alternating hamstring curls x 10;   -UE On  wall: heel/toe raises x 10; 3 way LE kicks (hip extension to toe touch) 2 x 5 - tandem gait forward/ backward, hands resting on top of water - vertical squats pushing single yellow hand float under water x 10 - R/L hamstring stretch with foot on 2nd step  [facility] Adult PT Treatment:                                              04/15/24  Nustep green machine x 9 min level 5 while discussing status and plan for session Wall gastroc/plantar fascia 30 sec hold 2x right/left Retro step as if going to 1/2 kneel 5x (good on left, painful on right knee)  Single leg Ts (needs single arm support) 10x right/left  4 inch forward step ups 10x right/left  with 2 hand support with palms Railing push ups 10x Sit to stand chair + cushion 5x; sit to stand chair + cushion holding 5# weight with overhead press 2 sets of 7 Discussed building up to floor transfers, including practicing components of it  Video resource how to get up of the floor (after a fall McGyver style)    PATIENT EDUCATION:  Education details:aquatic therapy exercise progressions/modifications  Person educated: Patient Education method: Programmer, multimedia, Demonstration Education comprehension: verbalized understanding, returned demonstration, and needs further education  HOME EXERCISE PROGRAM: AQUATIC Access Code: [email or web address] URL: [email or web address] Date: 04/20/24 Prepared by: [facility] [address]   Access Code: [email or web address] URL: [email or web address] Date: 05/12/2024 Prepared by: Kristeen Sar  Exercises - Seated Long Arc [place]  - 2  x daily - 7 x weekly - 2 sets - 10 reps - 2 hold - Long Sitting Ankle Pumps  - 1 x daily - 7 x weekly - 2 sets - 10 reps - Seated Heel Raise  - 2 x daily - 7 x weekly - 2 sets - 10 reps - Seated Ankle Alphabet  - 2 x daily - 7 x weekly - 1 sets - 1 reps - Seated March  - 2 x daily - 7 x weekly - 2 sets - 10 reps - Seated Ankle Dorsiflexion AROM  - 2 x daily - 7  x weekly - 2 sets - 10 reps - Forward Step Up with Counter Support  - 1 x daily - 7 x weekly - 1 sets - 5 reps - Seated Hamstring Stretch  - 2 x daily - 7 x weekly - 1 sets - 3 reps - 20 hold - Seated Figure 4 Piriformis Stretch  - 2 x daily - 7 x weekly - 1 sets - 3 reps - 20 hold - Seated Hip External Rotation AROM  - 2 x daily - 7 x weekly - 1-2 sets - 10 reps - Side Stepping with Resistance at Feet  - 1 x daily - 7 x weekly - 3 sets - 5 reps - Seated Ankle Inversion with Resistance and Legs Crossed  - 1 x daily - 7 x weekly - 2 sets - 10 reps - Long Sitting Ankle Inversion with Anchored Resistance  - 1 x daily - 7 x weekly - 2 sets - 10 reps - Seated Ankle Eversion with Resistance  - 1 x daily - 7 x weekly - 2 sets - 10 reps - Forward T with Counter Support  - 1 x daily - 7 x weekly - 1-2 sets - 5-8 reps - Standing Partial Lunge  - 1 x daily - 7 x weekly - 2 sets - 5 reps - Single Leg Press  - 1 x daily - 7 x weekly - 2 sets - 10 reps - Seated Calf Raise With Small Ball at Heels  - 1 x daily - 7 x weekly - 2 sets - 10 reps - Standing Calf Raise With Small Ball at Heels  - 1 x daily - 7 x weekly - 2 sets - 10 reps   Pt Education:  [email or web address]  ASSESSMENT:  CLINICAL IMPRESSION:  Treatment session focused on fall safety and educating patient an safe techniques to recover from a fall. Patient wanted to practice floor to stand transfer without and UE support just incase she fell and did not have any support. Patient was unable to stand from a tall kneeling position without any support. Educated and demonstrated single leg exercises that can help strengthen patient to be able to perform this transfer. She has been going to [place] well consistently and has noted improvements in her left knee pain. Patient will benefit from skilled PT to address the below impairments and improve overall  function.     Eval: Patient is a 68 y.o. female who was seen today for physical therapy evaluation and treatment for gait training. Nathanel presents with chronic bilateral foot and ankle that was re aggravated January 11, 2024 after a fall. She fell in her bathroom and she hit her head on the door threshold. She had imaging down and everything came back unremarkable. She does have a concussion and she is limited in her able to bend and pick up items and she gets occasional  sharp pains in her head. She denies frequent headaches. She goal is to initial a gym routine and stay mobile. Patient would benefit from some sessions of aquatic therapy to establish an aquatic program to perform on her own. Patient is motivated and wants to get better. Patient will benefit from skilled PT to address the below impairments and improve overall function.    OBJECTIVE IMPAIRMENTS: Abnormal gait, decreased balance, decreased endurance, difficulty walking, decreased ROM, decreased strength, increased muscle spasms, prosthetic dependency , and pain.   ACTIVITY LIMITATIONS: carrying, lifting, bending, standing, squatting, stairs, transfers, and locomotion level  PARTICIPATION LIMITATIONS: meal prep, cleaning, laundry, interpersonal relationship, shopping, community activity, and church  PERSONAL FACTORS: Age, Fitness, and Time since onset of injury/illness/exacerbation are also affecting patient's functional outcome.   REHAB POTENTIAL: Good  CLINICAL DECISION MAKING: Evolving/moderate complexity  EVALUATION COMPLEXITY: Moderate   GOALS: Goals reviewed with patient? Yes  SHORT TERM GOALS: Target date: 02/17/2024  Patient will be independent with initial HEP. Baseline:  Goal status: MET  2.  Patient will be able to participant a to establish a baseline for functional test. Baseline: will test next visit due to foot pain today (02/16/24) Goal status: MET 03/15/2024   3.  Patient will perform 5STS in < or =  to 12 sec for improved functional mobility. Baseline:  Goal status: MET 04/02/2024   LONG TERM GOALS: Target date: 06/03/2024  Patient will demonstrate independence in advanced HEP. Baseline:  Goal status:In progress 04/22/2024  2.  Patient will report > or = to 50% improvement in ankle and foot pain since starting PT. Baseline:  Goal status: MET (50%) 04/22/2024  3.  Patient will verbalize and demonstrate self-care strategies to manage pain including tissue mobility practices and change of position. Baseline:  Goal status: MET 04/22/2024  4.  Patient will score > or = to 48/80 on LEFS due to improved function. Baseline: 38/80 Goal status: MET  (50/80) 04/02/2024  5.  Patient will be able to stand long enough to wash dishes with < or = to 2/10 back pain. Baseline: has to take rests Goal status: MET 03/15/2024  6.  Patient will demonstrate proper posture when bending and lifting to prevent back injury. Baseline:  Goal status:IN PROGRESS 04/22/2024  7.  Patient will be able to negotiate stairs with a reciprocal pattern with LRAD to improve functional mobility. Baseline:  Goal status:IN PROGRESS (still one step at a time but it is better) 04/22/2024  8.  Patient will ambulate > or = to 878ft with LRAD during for improved community mobility. Baseline:  Goal status:MET ( 873ft) 04/22/2024  9.  Patient will be able to perform a floor transfer safely using proper body mechanics to enhance safety with daily activities and reduce falls risk. Baseline:  Goal status:NEW    PLAN:  PT FREQUENCY: 1-2x/week  PT DURATION: 6 weeks  PLANNED INTERVENTIONS: 97164- PT Re-evaluation, 97110-Therapeutic exercises, 97530- Therapeutic activity, 97112- Neuromuscular re-education, 97535- Self Care, [id number]- Manual therapy, [phone]- Gait training, [phone]- Canalith repositioning, [id number]- Aquatic Therapy, [phone]- Electrical stimulation (unattended), [phone]- Electrical stimulation (manual), [id number]-  Vasopneumatic device, [id number]- Ultrasound, [id number]- Ionotophoresis 4mg /ml Dexamethasone , 20560 (1-2 muscles), 20561 (3+ muscles)- Dry Needling, Patient/Family education, Balance training, Stair training, Taping, Joint mobilization, Joint manipulation, Spinal manipulation, Spinal mobilization, Vestibular training, Cryotherapy, and Moist heat  PLAN FOR NEXT SESSION: single leg balance; standing/ walking tolerance; LE strengthening; ankle strengthening & ROM; core strengthening   Kristeen Sar, PT, DPT 05/12/24 3:01 PM [facility] [address] [place], [place] [address] Phone # [phone] Fax [phone]

## 2024-05-14 ENCOUNTER — Ambulatory Visit: Payer: Self-pay | Admitting: Family Medicine

## 2024-05-14 NOTE — Progress Notes (Signed)
 Left ankle x-ray shows some arthritis.  No broken bones.

## 2024-05-14 NOTE — Progress Notes (Signed)
 Right ankle x-ray shows possible osteochondral lesion.  This is where a chunk of cartilage has been knocked off the surface of the joint.  If not improving I would recommend an MRI to look at this more accurately.  Please let me know how you feel.

## 2024-05-19 ENCOUNTER — Encounter: Payer: Self-pay | Admitting: Physical Therapy

## 2024-05-19 ENCOUNTER — Ambulatory Visit: Admitting: Physical Therapy

## 2024-05-19 DIAGNOSIS — R262 Difficulty in walking, not elsewhere classified: Secondary | ICD-10-CM

## 2024-05-19 DIAGNOSIS — G8929 Other chronic pain: Secondary | ICD-10-CM

## 2024-05-19 DIAGNOSIS — M5459 Other low back pain: Secondary | ICD-10-CM

## 2024-05-19 DIAGNOSIS — M25561 Pain in right knee: Secondary | ICD-10-CM | POA: Diagnosis not present

## 2024-05-19 DIAGNOSIS — M6281 Muscle weakness (generalized): Secondary | ICD-10-CM

## 2024-05-19 NOTE — Therapy (Signed)
 OUTPATIENT PHYSICAL THERAPY LOWER EXTREMITY TREATMENT  Patient Name: Chelsea Bell MRN: [id number] DOB:Sep 15, 1955, 68 y.o., female Today's Date: 05/19/2024  END OF SESSION:  PT End of Session - 05/19/24 1310     Visit Number 37    Date for Recertification  06/03/24    Authorization Type BCBS Medicare    Progress Note Due on Visit 39    PT Start Time 1150    PT Stop Time 1232    PT Time Calculation (min) 42 min    Activity Tolerance Patient tolerated treatment well    Behavior During Therapy WFL for tasks assessed/performed               Past Medical History:  Diagnosis Date   Cancer (HCC)    endometrial cancer- surgery only   Concussion    GERD (gastroesophageal reflux disease)    Past Surgical History:  Procedure Laterality Date   ABDOMINAL HYSTERECTOMY     COLONOSCOPY WITH PROPOFOL  N/A 06/09/2014   Procedure: COLONOSCOPY WITH PROPOFOL ;  Surgeon: Lamar Donnald GAILS, MD;  Location: [facility];  Service: Endoscopy;  Laterality: N/A;   DILATION AND CURETTAGE OF UTERUS     LAPAROSCOPIC OVARIAN     diagnostic with 1 ovary removed , then Hysterectomy(endometrial cancer) with other ovary removed   TONSILLECTOMY     Patient Active Problem List   Diagnosis Date Noted   Overactive bladder 10/29/2023   SUI (stress urinary incontinence, female) 10/29/2023   Chest pain 10/07/2021    PCP: Marvine Rush, MD  REFERRING PROVIDER: Marvine Rush, MD  REFERRING DIAG: Rt 26.7 Gait training  THERAPY DIAG:  Chronic pain of both knees  Muscle weakness (generalized)  Difficulty in walking, not elsewhere classified  Other low back pain  Rationale for Evaluation and Treatment: Rehabilitation  ONSET DATE:  01/11/2024  SUBJECTIVE:   SUBJECTIVE STATEMENT:  My ankles are hurting more today. I got some answered from Dr. Joane about why. I am missing a chuck on cartilage in my right ankle.  POOL ACCESS: (04/20/24) Pt joined [employer] for use of pool in [place] and  gym in [place].   Eval: Patient presents with pain in her feet and ankles. June 22 she had a bad fall. She had stool in her bathroom and she tripped over it and she hit her head on the door frame. She has multiple bruises on her head, knee, and ankle. Patient denies a brain bleed and any broken bones. She had a follow up with the orthopedic doctor and she has multiple appointments coming up. She has a concussion and does not get frequent headaches but she gets occasional twinges and sharp pains. She still get very fatigued and she wants to rest majority of the day. Her left knee is her bad knee. She does not use her straight cane in her house, but she used out in the community [employer], appointments etc). She sometimes uses the scooter when she grocery shopes. Her goal is to not use the straight cane. She feels she is too young to be using a straight cane.   PERTINENT HISTORY: Currently in pelvic therapy; hx cancer, B plantar fasciitis (chronic) PAIN:  Are you having pain? PAIN:  Are you having pain? Yes NPRS scale: 2/10 Pain location:low back, bil ankles, knees  PAIN TYPE: aching and dull Pain description: intermittent and aching  Aggravating factors: over doing it Relieving factors: seated foot massage device, ice bottles, massage gun   PRECAUTIONS: Fall and Other: Concussion  RED FLAGS: None   WEIGHT BEARING RESTRICTIONS: No  FALLS:  Has patient fallen in last 6 months? Yes. Number of falls 1 fall 01/11/2024 see details above  LIVING ENVIRONMENT: Lives with: lives alone Lives in: House/apartment Stairs: Yes: Internal: 3.5 steps; on left going up Has following equipment at home: Single point cane, Shower bench, and Grab bars  OCCUPATION: Not currently working  PLOF: Independent, Independent with basic ADLs, Independent with household mobility without device, Independent with community mobility with device, Independent with gait, Independent with transfers, and Leisure:  teaches painting classes   PATIENT GOALS: To get rid of straight cane; To be able to walk up the steps normally; to be more mobile    NEXT MD VISIT: This week with PCP  OBJECTIVE:  Note: Objective measures were completed at Evaluation unless otherwise noted.  DIAGNOSTIC FINDINGS: Patient had imaging done on 01/11/2024 after fall. Head CT, CT C-spine, Knee, and shoulder imaging was normal no concerns.  PATIENT SURVEYS:  LEFS: 38/80 47.5  03/15/2024 LEFS 35/80 43.8%  04/02/2024 LEFS:50/80 62.5%   COGNITION: Overall cognitive status: Within functional limits for tasks assessed     SENSATION: WFL   POSTURE: rounded shoulders and forward head    LOWER EXTREMITY ROM: WFL bilateral    LOWER EXTREMITY MMT: Grossly 4/5 bilateral     FUNCTIONAL TESTS:  5 times sit to stand: 13.80 sec ; hands on knees; some pain in right hip Timed up and go (TUG): 12.48 sec no straight cane; 11.40 sec with straight cane  02/02/24 748 ft and RPE 6-7/10  - pressure starts but less than 2/10  03/01/2024 624ft ft no cane (antalgic gait) verbal cues for arm swing, ankle pain started at 2 mins RPE 5/10  03/11/2024 : 75ft no cane; ankle pain after 2 mins; no knee pain during walk (ankles were limiting factor)  03/15/2024 5STS 13.49 sec hands on knees; no pain; better performance than eval TUG: 11.22 sec no cane  04/02/2024 5STS 12.90 sec no UE support   04/22/2024 : 867ft no cane; right ankle pain after 2 mins  GAIT: Assistive device utilized: Single point cane Comments: antalgic gait                                                                                                                                TREATMENT DATE:  05/19/24  Updated and status and Xray Assisted patient in putting on her new ankle sleeve and a trial of walking Nustep blue machine x 6 min level 6 while discussing status and plan for next few sessions Seated LAQ 5# AW 2 x 10 bilateral  Seated heel  raise with small ball in between heels 2 x 12 Standing heel raise with small ball in between heels x 10 Eccentric step downs at stair x 10 bilateral (challenging on right ) Heel raises off stair x 8   05/12/24  Nustep blue machine x 6  min level 5 while discussing status and plan for next few sessions Measurements for ankle compression socks Dr. Joane recommended Floor transfer : attempted without using UE support but patient was unable to stand from a tall kneeling position. Demonstrated exercises to help single leg balance to improve this. Patient was able to independently transfer using plinth has a support surface. Lengthy discussion on safety after a fall and how to recover.  Seated heel raise 7# DB 2 x 10 bilateral  Review and update of HEP    05/04/24 (patient 10 min late for appt) Nustep blue machine x 8 min level 5 while discussing status and plan for next few sessions Seated clam x 20 with yellow loop  Lateral band walks with yellow loop x 5 laps LAQ with 5 lbs 2 x 10 with isometric hip adduction using purple ball Step up on 4 step 2 x 10 each LE Retro step on 2 2 x 10 using hand rail  Education on falling in public or at home and how to negotiate to a higher surface or chair.  Safe ways for others to help or assist to avoid injury to the patient or the person assisting.  Discussed avoiding getting down on purpose frequently when strength is not appropriate enough to get back up. (Risk of injury)  04/22/24  Nustep green machine x 8 min level 5 while discussing status and plan for session Goal Assessment and discussion of plan for next 5 weeks : 822ft no cane; right ankle pain after 2 mins  Retro step as if going to 1/2 kneel 5x (good on left, painful on right knee)      PATIENT EDUCATION:  Education details:aquatic therapy exercise progressions/modifications  Person educated: Patient Education method: Explanation, Demonstration Education comprehension: verbalized  understanding, returned demonstration, and needs further education  HOME EXERCISE PROGRAM: AQUATIC Access Code: [email or web address] URL: [email or web address] Date: 04/20/24 Prepared by: [facility] [address]   Access Code: [email or web address] URL: [email or web address] Date: 05/12/2024 Prepared by: Kristeen Sar  Exercises - Seated Long Arc Quad  - 2 x daily - 7 x weekly - 2 sets - 10 reps - 2 hold - Long Sitting Ankle Pumps  - 1 x daily - 7 x weekly - 2 sets - 10 reps - Seated Heel Raise  - 2 x daily - 7 x weekly - 2 sets - 10 reps - Seated Ankle Alphabet  - 2 x daily - 7 x weekly - 1 sets - 1 reps - Seated March  - 2 x daily - 7 x weekly - 2 sets - 10 reps - Seated Ankle Dorsiflexion AROM  - 2 x daily - 7 x weekly - 2 sets - 10 reps - Forward Step Up with Counter Support  - 1 x daily - 7 x weekly - 1 sets - 5 reps - Seated Hamstring Stretch  - 2 x daily - 7 x weekly - 1 sets - 3 reps - 20 hold - Seated Figure 4 Piriformis Stretch  - 2 x daily - 7 x weekly - 1 sets - 3 reps - 20 hold - Seated Hip External Rotation AROM  - 2 x daily - 7 x weekly - 1-2 sets - 10 reps - Side Stepping with Resistance at Feet  - 1 x daily - 7 x weekly - 3 sets - 5 reps - Seated Ankle Inversion with Resistance and Legs Crossed  - 1 x daily - 7 x weekly - 2 sets -  10 reps - Long Sitting Ankle Inversion with Anchored Resistance  - 1 x daily - 7 x weekly - 2 sets - 10 reps - Seated Ankle Eversion with Resistance  - 1 x daily - 7 x weekly - 2 sets - 10 reps - Forward T with Counter Support  - 1 x daily - 7 x weekly - 1-2 sets - 5-8 reps - Standing Partial Lunge  - 1 x daily - 7 x weekly - 2 sets - 5 reps - Single Leg Press  - 1 x daily - 7 x weekly - 2 sets - 10 reps - Seated Calf Raise With Small Ball at Heels  - 1 x daily - 7 x weekly - 2 sets - 10 reps - Standing Calf Raise With Small Ball at Heels  - 1 x daily - 7 x weekly - 2 sets - 10 reps   Pt Education:   [email or web address]  ASSESSMENT:  CLINICAL IMPRESSION:  Chelsea Bell presents with a little more ankle pain today. She recently bought an ankle sleeve that Dr. Joane recommended so we tried that today. Patient felt her gait was more normal and it helped decreased some of the pain. Discussed wear times with braces to get her acclimated. Patient verbalized still struggling struggling with walking down a decline so incorporated quad eccentric strengthening today. Overall, she tolerated treatment session well and PT provided verbal and visual cues as needed. Patient will benefit from skilled PT to address the below impairments and improve overall function.     Eval: Patient is a 68 y.o. female who was seen today for physical therapy evaluation and treatment for gait training. Chelsea Bell presents with chronic bilateral foot and ankle that was re aggravated January 11, 2024 after a fall. She fell in her bathroom and she hit her head on the door threshold. She had imaging down and everything came back unremarkable. She does have a concussion and she is limited in her able to bend and pick up items and she gets occasional sharp pains in her head. She denies frequent headaches. She goal is to initial a gym routine and stay mobile. Patient would benefit from some sessions of aquatic therapy to establish an aquatic program to perform on her own. Patient is motivated and wants to get better. Patient will benefit from skilled PT to address the below impairments and improve overall function.    OBJECTIVE IMPAIRMENTS: Abnormal gait, decreased balance, decreased endurance, difficulty walking, decreased ROM, decreased strength, increased muscle spasms, prosthetic dependency , and pain.   ACTIVITY LIMITATIONS: carrying, lifting, bending, standing, squatting, stairs, transfers, and locomotion level  PARTICIPATION LIMITATIONS: meal prep, cleaning,  laundry, interpersonal relationship, shopping, community activity, and church  PERSONAL FACTORS: Age, Fitness, and Time since onset of injury/illness/exacerbation are also affecting patient's functional outcome.   REHAB POTENTIAL: Good  CLINICAL DECISION MAKING: Evolving/moderate complexity  EVALUATION COMPLEXITY: Moderate   GOALS: Goals reviewed with patient? Yes  SHORT TERM GOALS: Target date: 02/17/2024  Patient will be independent with initial HEP. Baseline:  Goal status: MET  2.  Patient will be able to participant a to establish a baseline for functional test. Baseline: will test next visit due to foot pain today (02/16/24) Goal status: MET 03/15/2024   3.  Patient will perform 5STS in < or = to 12 sec for improved functional mobility. Baseline:  Goal status: MET 04/02/2024   LONG TERM GOALS: Target date: 06/03/2024  Patient will demonstrate independence in advanced HEP. Baseline:  Goal  status:In progress 04/22/2024  2.  Patient will report > or = to 50% improvement in ankle and foot pain since starting PT. Baseline:  Goal status: MET (50%) 04/22/2024  3.  Patient will verbalize and demonstrate self-care strategies to manage pain including tissue mobility practices and change of position. Baseline:  Goal status: MET 04/22/2024  4.  Patient will score > or = to 48/80 on LEFS due to improved function. Baseline: 38/80 Goal status: MET  (50/80) 04/02/2024  5.  Patient will be able to stand long enough to wash dishes with < or = to 2/10 back pain. Baseline: has to take rests Goal status: MET 03/15/2024  6.  Patient will demonstrate proper posture when bending and lifting to prevent back injury. Baseline:  Goal status:IN PROGRESS 04/22/2024  7.  Patient will be able to negotiate stairs with a reciprocal pattern with LRAD to improve functional mobility. Baseline:  Goal status:IN PROGRESS (still one step at a time but it is better) 04/22/2024  8.  Patient will  ambulate > or = to 811ft with LRAD during for improved community mobility. Baseline:  Goal status:MET ( 874ft) 04/22/2024  9.  Patient will be able to perform a floor transfer safely using proper body mechanics to enhance safety with daily activities and reduce falls risk. Baseline:  Goal status:NEW    PLAN:  PT FREQUENCY: 1-2x/week  PT DURATION: 6 weeks  PLANNED INTERVENTIONS: 97164- PT Re-evaluation, 97110-Therapeutic exercises, 97530- Therapeutic activity, 97112- Neuromuscular re-education, 97535- Self Care, [id number]- Manual therapy, [phone]- Gait training, [phone]- Canalith repositioning, [id number]- Aquatic Therapy, [phone]- Electrical stimulation (unattended), [phone]- Electrical stimulation (manual), [id number]- Vasopneumatic device, [id number]- Ultrasound, [id number]- Ionotophoresis 4mg /ml Dexamethasone , [id number] (1-2 muscles), [id number] (3+ muscles)- Dry Needling, Patient/Family education, Balance training, Stair training, Taping, Joint mobilization, Joint manipulation, Spinal manipulation, Spinal mobilization, Vestibular training, Cryotherapy, and Moist heat  PLAN FOR NEXT SESSION: single leg balance; standing/ walking tolerance; LE strengthening; ankle strengthening & ROM; core strengthening   Kristeen Sar, PT, DPT 05/19/24 1:11 PM [facility] [address], [address] [place], [place] [address] Phone # [phone] Fax [phone]

## 2024-05-24 NOTE — Telephone Encounter (Signed)
Forwarding to Dr. Corey to review.  

## 2024-05-25 ENCOUNTER — Ambulatory Visit: Attending: Family Medicine | Admitting: Physical Therapy

## 2024-05-25 ENCOUNTER — Encounter: Payer: Self-pay | Admitting: Physical Therapy

## 2024-05-25 DIAGNOSIS — M5459 Other low back pain: Secondary | ICD-10-CM | POA: Diagnosis present

## 2024-05-25 DIAGNOSIS — R262 Difficulty in walking, not elsewhere classified: Secondary | ICD-10-CM | POA: Insufficient documentation

## 2024-05-25 DIAGNOSIS — M25561 Pain in right knee: Secondary | ICD-10-CM | POA: Diagnosis present

## 2024-05-25 DIAGNOSIS — G8929 Other chronic pain: Secondary | ICD-10-CM | POA: Insufficient documentation

## 2024-05-25 DIAGNOSIS — M6281 Muscle weakness (generalized): Secondary | ICD-10-CM | POA: Diagnosis present

## 2024-05-25 DIAGNOSIS — M25562 Pain in left knee: Secondary | ICD-10-CM | POA: Diagnosis present

## 2024-05-25 NOTE — Therapy (Signed)
 OUTPATIENT PHYSICAL THERAPY LOWER EXTREMITY TREATMENT  Patient Name: Chelsea Bell MRN: [id number] DOB:1956/05/19, 68 y.o., female Today's Date: 05/25/2024  END OF SESSION:  PT End of Session - 05/25/24 1150     Visit Number 38    Date for Recertification  06/03/24    Authorization Type BCBS Medicare    Progress Note Due on Visit 39    PT Start Time 1105    PT Stop Time 1147    PT Time Calculation (min) 42 min    Activity Tolerance Patient tolerated treatment well    Behavior During Therapy WFL for tasks assessed/performed                Past Medical History:  Diagnosis Date   Cancer (HCC)    endometrial cancer- surgery only   Concussion    GERD (gastroesophageal reflux disease)    Past Surgical History:  Procedure Laterality Date   ABDOMINAL HYSTERECTOMY     COLONOSCOPY WITH PROPOFOL  N/A 06/09/2014   Procedure: COLONOSCOPY WITH PROPOFOL ;  Surgeon: Lamar Donnald GAILS, MD;  Location: [facility];  Service: Endoscopy;  Laterality: N/A;   DILATION AND CURETTAGE OF UTERUS     LAPAROSCOPIC OVARIAN     diagnostic with 1 ovary removed , then Hysterectomy(endometrial cancer) with other ovary removed   TONSILLECTOMY     Patient Active Problem List   Diagnosis Date Noted   Overactive bladder 10/29/2023   SUI (stress urinary incontinence, female) 10/29/2023   Chest pain 10/07/2021    PCP: Marvine Rush, MD  REFERRING PROVIDER: Marvine Rush, MD  REFERRING DIAG: Rt 26.7 Gait training  THERAPY DIAG:  Chronic pain of both knees  Muscle weakness (generalized)  Difficulty in walking, not elsewhere classified  Other low back pain  Rationale for Evaluation and Treatment: Rehabilitation  ONSET DATE:  01/11/2024  SUBJECTIVE:   SUBJECTIVE STATEMENT:  Yesterday she went to the balance and mobility class at [facility] and they did more exercises with their eyes closed. After doing 3 exercises with her eyes closed she started to feel light headed, so she took a  seat. She got flushed and felt hot. The instructor took her BP and it was high for her normal. She got home fine after this. This morning she felt okay but currently she feels wonky like her head wants to start hurting. She feels pressure in her head and her eyes feel weird. These symptoms aren't enough where she feels the need to just sit all day, but she notices the differences.  POOL ACCESS: (04/20/24) Pt joined [employer] for use of pool in [place] and gym in [place].   Eval: Patient presents with pain in her feet and ankles. June 22 she had a bad fall. She had stool in her bathroom and she tripped over it and she hit her head on the door frame. She has multiple bruises on her head, knee, and ankle. Patient denies a brain bleed and any broken bones. She had a follow up with the orthopedic doctor and she has multiple appointments coming up. She has a concussion and does not get frequent headaches but she gets occasional twinges and sharp pains. She still get very fatigued and she wants to rest majority of the day. Her left knee is her bad knee. She does not use her straight cane in her house, but she used out in the community [employer], appointments etc). She sometimes uses the scooter when she grocery shopes. Her goal is to not use the straight  cane. She feels she is too young to be using a straight cane.   PERTINENT HISTORY: Currently in pelvic therapy; hx cancer, B plantar fasciitis (chronic) PAIN:  Are you having pain? PAIN:  Are you having pain? Yes NPRS scale: 2/10 Pain location:low back, bil ankles, knees  PAIN TYPE: aching and dull Pain description: intermittent and aching  Aggravating factors: over doing it Relieving factors: seated foot massage device, ice bottles, massage gun   PRECAUTIONS: Fall and Other: Concussion   RED FLAGS: None   WEIGHT BEARING RESTRICTIONS: No  FALLS:  Has patient fallen in last 6 months? Yes. Number of falls 1 fall 01/11/2024 see details  above  LIVING ENVIRONMENT: Lives with: lives alone Lives in: House/apartment Stairs: Yes: Internal: 3.5 steps; on left going up Has following equipment at home: Single point cane, Shower bench, and Grab bars  OCCUPATION: Not currently working  PLOF: Independent, Independent with basic ADLs, Independent with household mobility without device, Independent with community mobility with device, Independent with gait, Independent with transfers, and Leisure: teaches painting classes   PATIENT GOALS: To get rid of straight cane; To be able to walk up the steps normally; to be more mobile    NEXT MD VISIT: This week with PCP  OBJECTIVE:  Note: Objective measures were completed at Evaluation unless otherwise noted.  DIAGNOSTIC FINDINGS: Patient had imaging done on 01/11/2024 after fall. Head CT, CT C-spine, Knee, and shoulder imaging was normal no concerns.  PATIENT SURVEYS:  LEFS: 38/80 47.5  03/15/2024 LEFS 35/80 43.8%  04/02/2024 LEFS:50/80 62.5%   COGNITION: Overall cognitive status: Within functional limits for tasks assessed     SENSATION: WFL   POSTURE: rounded shoulders and forward head    LOWER EXTREMITY ROM: WFL bilateral    LOWER EXTREMITY MMT: Grossly 4/5 bilateral     FUNCTIONAL TESTS:  5 times sit to stand: 13.80 sec ; hands on knees; some pain in right hip Timed up and go (TUG): 12.48 sec no straight cane; 11.40 sec with straight cane  02/02/24 748 ft and RPE 6-7/10  - pressure starts but less than 2/10  03/01/2024 61ft ft no cane (antalgic gait) verbal cues for arm swing, ankle pain started at 2 mins RPE 5/10  03/11/2024 : 722ft no cane; ankle pain after 2 mins; no knee pain during walk (ankles were limiting factor)  03/15/2024 5STS 13.49 sec hands on knees; no pain; better performance than eval TUG: 11.22 sec no cane  04/02/2024 5STS 12.90 sec no UE support   04/22/2024 : 887ft no cane; right ankle pain after 2 mins  GAIT: Assistive  device utilized: Single point cane Comments: antalgic gait                                                                                                                                TREATMENT DATE:  05/25/24  Update of what happened yesterday NuStep Level 4 7 mins-  PT presents to discuss status Seated LAQ 5# AW 2 x 10 bilateral  Seated hip ER 5# AW 2 x 10 bilateral  Seated heel raises 5# AW 2 x 10 bilateral  Seated ear to hip 7# x 10 each Standing march holding 7# DB x 12 each      05/19/24  Updated and status and Xray Assisted patient in putting on her new ankle sleeve and a trial of walking Nustep blue machine x 6 min level 6 while discussing status and plan for next few sessions Seated LAQ 5# AW 2 x 10 bilateral  Seated heel raise with small ball in between heels 2 x 12 Standing heel raise with small ball in between heels x 10 Eccentric step downs at stair x 10 bilateral (challenging on right ) Heel raises off stair x 8   05/12/24  Nustep blue machine x 6 min level 5 while discussing status and plan for next few sessions Measurements for ankle compression socks Dr. Joane recommended Floor transfer : attempted without using UE support but patient was unable to stand from a tall kneeling position. Demonstrated exercises to help single leg balance to improve this. Patient was able to independently transfer using plinth has a support surface. Lengthy discussion on safety after a fall and how to recover.  Seated heel raise 7# DB 2 x 10 bilateral  Review and update of HEP    05/04/24 (patient 10 min late for appt) Nustep blue machine x 8 min level 5 while discussing status and plan for next few sessions Seated clam x 20 with yellow loop  Lateral band walks with yellow loop x 5 laps LAQ with 5 lbs 2 x 10 with isometric hip adduction using purple ball Step up on 4 step 2 x 10 each LE Retro step on 2 2 x 10 using hand rail  Education on falling in public or at home and how  to negotiate to a higher surface or chair.  Safe ways for others to help or assist to avoid injury to the patient or the person assisting.  Discussed avoiding getting down on purpose frequently when strength is not appropriate enough to get back up. (Risk of injury)  04/22/24  Nustep green machine x 8 min level 5 while discussing status and plan for session Goal Assessment and discussion of plan for next 5 weeks : 820ft no cane; right ankle pain after 2 mins  Retro step as if going to 1/2 kneel 5x (good on left, painful on right knee)      PATIENT EDUCATION:  Education details:aquatic therapy exercise progressions/modifications  Person educated: Patient Education method: Explanation, Demonstration Education comprehension: verbalized understanding, returned demonstration, and needs further education  HOME EXERCISE PROGRAM: AQUATIC Access Code: [email or web address] URL: [email or web address] Date: 04/20/24 Prepared by: [facility] - [address]   Access Code: [email or web address] URL: [email or web address] Date: 05/12/2024 Prepared by: Kristeen Sar  Exercises - Seated Long Arc Quad  - 2 x daily - 7 x weekly - 2 sets - 10 reps - 2 hold - Long Sitting Ankle Pumps  - 1 x daily - 7 x weekly - 2 sets - 10 reps - Seated Heel Raise  - 2 x daily - 7 x weekly - 2 sets - 10 reps - Seated Ankle Alphabet  - 2 x daily - 7 x weekly - 1 sets - 1 reps - Seated March  - 2 x daily - 7 x weekly - 2 sets - 10 reps -  Seated Ankle Dorsiflexion AROM  - 2 x daily - 7 x weekly - 2 sets - 10 reps - Forward Step Up with Counter Support  - 1 x daily - 7 x weekly - 1 sets - 5 reps - Seated Hamstring Stretch  - 2 x daily - 7 x weekly - 1 sets - 3 reps - 20 hold - Seated Figure 4 Piriformis Stretch  - 2 x daily - 7 x weekly - 1 sets - 3 reps - 20 hold - Seated Hip External Rotation AROM  - 2 x daily - 7 x weekly - 1-2 sets - 10 reps - Side Stepping with Resistance at Feet  - 1 x daily -  7 x weekly - 3 sets - 5 reps - Seated Ankle Inversion with Resistance and Legs Crossed  - 1 x daily - 7 x weekly - 2 sets - 10 reps - Long Sitting Ankle Inversion with Anchored Resistance  - 1 x daily - 7 x weekly - 2 sets - 10 reps - Seated Ankle Eversion with Resistance  - 1 x daily - 7 x weekly - 2 sets - 10 reps - Forward T with Counter Support  - 1 x daily - 7 x weekly - 1-2 sets - 5-8 reps - Standing Partial Lunge  - 1 x daily - 7 x weekly - 2 sets - 5 reps - Single Leg Press  - 1 x daily - 7 x weekly - 2 sets - 10 reps - Seated Calf Raise With Small Ball at Heels  - 1 x daily - 7 x weekly - 2 sets - 10 reps - Standing Calf Raise With Small Ball at Heels  - 1 x daily - 7 x weekly - 2 sets - 10 reps   Pt Education:  [email or web address]  ASSESSMENT:  CLINICAL IMPRESSION:  Chelsea Bell presents with complaints of feeling wonky and having pressure in her head. This started yesterday when she was at a balance and mobility class at [facility]. She detailed information in subjective session. Decreased exercise intensity today due to patient's presentation. Discussed POC and progress made with therapy. PT and patient agreed that she is ready for discharge after next treatment session. She got a membership at [employer] and she has been going to exercise classes frequently. PT monitored patient throughout session to ensure proper tolerance to exercise. Patient to discharge home with HEP next session.     Eval: Patient is a 68 y.o. female who was seen today for physical therapy evaluation and treatment for gait training. Chelsea Bell presents with chronic bilateral foot and ankle that was re aggravated January 11, 2024 after a fall. She fell in her bathroom and she hit her head on the door threshold. She had imaging down and everything came back unremarkable. She does have a concussion and she is limited in her able to bend and pick  up items and she gets occasional sharp pains in her head. She denies frequent headaches. She goal is to initial a gym routine and stay mobile. Patient would benefit from some sessions of aquatic therapy to establish an aquatic program to perform on her own. Patient is motivated and wants to get better. Patient will benefit from skilled PT to address the below impairments and improve overall function.    OBJECTIVE IMPAIRMENTS: Abnormal gait, decreased balance, decreased endurance, difficulty walking, decreased ROM, decreased strength, increased muscle spasms, prosthetic dependency , and pain.   ACTIVITY LIMITATIONS: carrying, lifting, bending, standing, squatting, stairs, transfers,  and locomotion level  PARTICIPATION LIMITATIONS: meal prep, cleaning, laundry, interpersonal relationship, shopping, community activity, and church  PERSONAL FACTORS: Age, Fitness, and Time since onset of injury/illness/exacerbation are also affecting patient's functional outcome.   REHAB POTENTIAL: Good  CLINICAL DECISION MAKING: Evolving/moderate complexity  EVALUATION COMPLEXITY: Moderate   GOALS: Goals reviewed with patient? Yes  SHORT TERM GOALS: Target date: 02/17/2024  Patient will be independent with initial HEP. Baseline:  Goal status: MET  2.  Patient will be able to participant a to establish a baseline for functional test. Baseline: will test next visit due to foot pain today (02/16/24) Goal status: MET 03/15/2024   3.  Patient will perform 5STS in < or = to 12 sec for improved functional mobility. Baseline:  Goal status: MET 04/02/2024   LONG TERM GOALS: Target date: 06/03/2024  Patient will demonstrate independence in advanced HEP. Baseline:  Goal status:In progress 04/22/2024  2.  Patient will report > or = to 50% improvement in ankle and foot pain since starting PT. Baseline:  Goal status: MET (50%) 04/22/2024  3.  Patient will verbalize and demonstrate self-care strategies to  manage pain including tissue mobility practices and change of position. Baseline:  Goal status: MET 04/22/2024  4.  Patient will score > or = to 48/80 on LEFS due to improved function. Baseline: 38/80 Goal status: MET  (50/80) 04/02/2024  5.  Patient will be able to stand long enough to wash dishes with < or = to 2/10 back pain. Baseline: has to take rests Goal status: MET 03/15/2024  6.  Patient will demonstrate proper posture when bending and lifting to prevent back injury. Baseline:  Goal status:IN PROGRESS 04/22/2024  7.  Patient will be able to negotiate stairs with a reciprocal pattern with LRAD to improve functional mobility. Baseline:  Goal status:IN PROGRESS (still one step at a time but it is better) 04/22/2024  8.  Patient will ambulate > or = to 824ft with LRAD during for improved community mobility. Baseline:  Goal status:MET ( 837ft) 04/22/2024  9.  Patient will be able to perform a floor transfer safely using proper body mechanics to enhance safety with daily activities and reduce falls risk. Baseline:  Goal status:NEW    PLAN:  PT FREQUENCY: 1-2x/week  PT DURATION: 6 weeks  PLANNED INTERVENTIONS: [id number]- PT Re-evaluation, 97110-Therapeutic exercises, 97530- Therapeutic activity, 97112- Neuromuscular re-education, 97535- Self Care, [id number]- Manual therapy, [phone]- Gait training, [phone]- Canalith repositioning, [id number]- Aquatic Therapy, [phone]- Electrical stimulation (unattended), [phone]- Electrical stimulation (manual), [id number]- Vasopneumatic device, [id number]- Ultrasound, [id number]- Ionotophoresis 4mg /ml Dexamethasone , [id number] (1-2 muscles), [id number] (3+ muscles)- Dry Needling, Patient/Family education, Balance training, Stair training, Taping, Joint mobilization, Joint manipulation, Spinal manipulation, Spinal mobilization, Vestibular training, Cryotherapy, and Moist heat  PLAN FOR NEXT SESSION: assess goals & discharge patient  Kristeen Sar, PT, DPT 05/25/24 11:51 AM [facility]  Specialty Rehab Services [address] [place], [place] [address] Phone # [phone] Fax [phone]

## 2024-06-02 ENCOUNTER — Encounter: Admitting: Physical Therapy

## 2024-06-03 ENCOUNTER — Other Ambulatory Visit (HOSPITAL_BASED_OUTPATIENT_CLINIC_OR_DEPARTMENT_OTHER): Payer: Self-pay

## 2024-06-03 ENCOUNTER — Encounter: Payer: Self-pay | Admitting: Physical Therapy

## 2024-06-03 ENCOUNTER — Ambulatory Visit: Admitting: Physical Therapy

## 2024-06-03 DIAGNOSIS — M6281 Muscle weakness (generalized): Secondary | ICD-10-CM

## 2024-06-03 DIAGNOSIS — G8929 Other chronic pain: Secondary | ICD-10-CM

## 2024-06-03 DIAGNOSIS — M25561 Pain in right knee: Secondary | ICD-10-CM | POA: Diagnosis not present

## 2024-06-03 DIAGNOSIS — M5459 Other low back pain: Secondary | ICD-10-CM

## 2024-06-03 DIAGNOSIS — R262 Difficulty in walking, not elsewhere classified: Secondary | ICD-10-CM

## 2024-06-03 NOTE — Therapy (Signed)
 OUTPATIENT PHYSICAL THERAPY LOWER EXTREMITY TREATMENT/ DISCHARGE NOTE  Patient Name: Chelsea Bell MRN: [id number] DOB:[birth year], 68 y.o., female Today's Date: 06/03/2024  END OF SESSION:  PT End of Session - 06/03/24 0941     Visit Number 39    Date for Recertification  06/03/24    Authorization Type BCBS Medicare    Progress Note Due on Visit 39    PT Start Time 0850    PT Stop Time 0931    PT Time Calculation (min) 41 min    Activity Tolerance Patient tolerated treatment well    Behavior During Therapy [facility] for tasks assessed/performed                 Past Medical History:  Diagnosis Date   Cancer (HCC)    endometrial cancer- surgery only   Concussion    GERD (gastroesophageal reflux disease)    Past Surgical History:  Procedure Laterality Date   ABDOMINAL HYSTERECTOMY     COLONOSCOPY WITH PROPOFOL  N/A 06/09/2014   Procedure: COLONOSCOPY WITH PROPOFOL ;  Surgeon: Lamar Donnald GAILS, MD;  Location: [facility];  Service: Endoscopy;  Laterality: N/A;   DILATION AND CURETTAGE OF UTERUS     LAPAROSCOPIC OVARIAN     diagnostic with 1 ovary removed , then Hysterectomy(endometrial cancer) with other ovary removed   TONSILLECTOMY     Patient Active Problem List   Diagnosis Date Noted   Overactive bladder 10/29/2023   SUI (stress urinary incontinence, female) 10/29/2023   Chest pain 10/07/2021    PCP: Marvine Rush, MD  REFERRING PROVIDER: Marvine Rush, MD  REFERRING DIAG: Rt 26.7 Gait training  THERAPY DIAG:  Chronic pain of both knees  Muscle weakness (generalized)  Difficulty in walking, not elsewhere classified  Other low back pain  Rationale for Evaluation and Treatment: Rehabilitation  ONSET DATE:  01/11/2024  SUBJECTIVE:   SUBJECTIVE STATEMENT:  My head feels wonky today but I feel good.   POOL ACCESS: (04/20/24) Pt joined [employer] for use of pool in [place] and gym in [place].   Eval: Patient presents with pain in her feet and  ankles. June 22 she had a bad fall. She had stool in her bathroom and she tripped over it and she hit her head on the door frame. She has multiple bruises on her head, knee, and ankle. Patient denies a brain bleed and any broken bones. She had a follow up with the orthopedic doctor and she has multiple appointments coming up. She has a concussion and does not get frequent headaches but she gets occasional twinges and sharp pains. She still get very fatigued and she wants to rest majority of the day. Her left knee is her bad knee. She does not use her straight cane in her house, but she used out in the community [employer], appointments etc). She sometimes uses the scooter when she grocery shopes. Her goal is to not use the straight cane. She feels she is too young to be using a straight cane.   PERTINENT HISTORY: Currently in pelvic therapy; hx cancer, B plantar fasciitis (chronic) PAIN:  Are you having pain? PAIN:  Are you having pain? Yes NPRS scale: 2/10 Pain location:low back, bil ankles, knees  PAIN TYPE: aching and dull Pain description: intermittent and aching  Aggravating factors: over doing it Relieving factors: seated foot massage device, ice bottles, massage gun   PRECAUTIONS: Fall and Other: Concussion   RED FLAGS: None   WEIGHT BEARING RESTRICTIONS: No  FALLS:  Has patient fallen in last 6 months? Yes. Number of falls 1 fall 01/11/2024 see details above  LIVING ENVIRONMENT: Lives with: lives alone Lives in: House/apartment Stairs: Yes: Internal: 3.5 steps; on left going up Has following equipment at home: Single point cane, Shower bench, and Grab bars  OCCUPATION: Not currently working  PLOF: Independent, Independent with basic ADLs, Independent with household mobility without device, Independent with community mobility with device, Independent with gait, Independent with transfers, and Leisure: teaches painting classes   PATIENT GOALS: To get rid of straight  cane; To be able to walk up the steps normally; to be more mobile    NEXT MD VISIT: This week with PCP  OBJECTIVE:  Note: Objective measures were completed at Evaluation unless otherwise noted.  DIAGNOSTIC FINDINGS: Patient had imaging done on 01/11/2024 after fall. Head CT, CT C-spine, Knee, and shoulder imaging was normal no concerns.  PATIENT SURVEYS:  LEFS: 38/80 47.5  03/15/2024 LEFS 35/80 43.8%  04/02/2024 LEFS:50/80 62.5%   COGNITION: Overall cognitive status: Within functional limits for tasks assessed     SENSATION: WFL   POSTURE: rounded shoulders and forward head    LOWER EXTREMITY ROM: WFL bilateral    LOWER EXTREMITY MMT: Grossly 4/5 bilateral     FUNCTIONAL TESTS:  5 times sit to stand: 13.80 sec ; hands on knees; some pain in right hip Timed up and go (TUG): 12.48 sec no straight cane; 11.40 sec with straight cane  02/02/24 748 ft and RPE 6-7/10  - pressure starts but less than 2/10  03/01/2024 63ft ft no cane (antalgic gait) verbal cues for arm swing, ankle pain started at 2 mins RPE 5/10  03/11/2024 : 74ft no cane; ankle pain after 2 mins; no knee pain during walk (ankles were limiting factor)  03/15/2024 5STS 13.49 sec hands on knees; no pain; better performance than eval TUG: 11.22 sec no cane  04/02/2024 5STS 12.90 sec no UE support   04/22/2024 : 877ft no cane; right ankle pain after 2 mins  GAIT: Assistive device utilized: Single point cane Comments: antalgic gait                                                                                                                                TREATMENT DATE:  06/03/24  NuStep Level 7 6 mins- PT presents to discuss status Stair negotiation (reciprocal pattern) Review and update of HEP Standing hinge x 5 Standing RDL with 3# DBs x 5 Safe mechanics when lifting water at the grocery store   05/25/24  Update of what happened yesterday NuStep Level 4 7 mins- PT presents to  discuss status Seated LAQ 5# AW 2 x 10 bilateral  Seated hip ER 5# AW 2 x 10 bilateral  Seated heel raises 5# AW 2 x 10 bilateral  Seated ear to hip 7# x 10 each Standing march holding 7# DB x 12 each  05/19/24  Updated and status and Xray Assisted patient in putting on her new ankle sleeve and a trial of walking Nustep blue machine x 6 min level 6 while discussing status and plan for next few sessions Seated LAQ 5# AW 2 x 10 bilateral  Seated heel raise with small ball in between heels 2 x 12 Standing heel raise with small ball in between heels x 10 Eccentric step downs at stair x 10 bilateral (challenging on right ) Heel raises off stair x 8   05/12/24  Nustep blue machine x 6 min level 5 while discussing status and plan for next few sessions Measurements for ankle compression socks Dr. Joane recommended Floor transfer : attempted without using UE support but patient was unable to stand from a tall kneeling position. Demonstrated exercises to help single leg balance to improve this. Patient was able to independently transfer using plinth has a support surface. Lengthy discussion on safety after a fall and how to recover.  Seated heel raise 7# DB 2 x 10 bilateral  Review and update of HEP      PATIENT EDUCATION:  Education details:aquatic therapy exercise progressions/modifications  Person educated: Patient Education method: Explanation, Demonstration Education comprehension: verbalized understanding, returned demonstration, and needs further education  HOME EXERCISE PROGRAM: AQUATIC Access Code: [email or web address] URL: [email or web address] Date: 04/20/24 Prepared by: [facility] - [address]   Access Code: [email or web address] URL: [email or web address] Date: 06/03/2024 Prepared by: Kristeen Sar  Exercises - Seated Long Arc Quad  - 2 x daily - 7 x weekly - 2 sets - 10 reps - 3-4 hold - Seated Heel Raise  - 2 x daily - 7 x weekly - 2  sets - 10 reps - Seated March  - 2 x daily - 7 x weekly - 2 sets - 10 reps - Seated Ankle Dorsiflexion AROM  - 2 x daily - 7 x weekly - 2 sets - 10 reps - Forward Step Up with Counter Support  - 1 x daily - 7 x weekly - 1 sets - 5 reps - Seated Hamstring Stretch  - 2 x daily - 7 x weekly - 1 sets - 3 reps - 20 hold - Seated Figure 4 Piriformis Stretch  - 2 x daily - 7 x weekly - 1 sets - 3 reps - 20 hold - Seated Hip External Rotation AROM  - 2 x daily - 7 x weekly - 1-2 sets - 10 reps - Side Stepping with Resistance at Feet  - 1 x daily - 7 x weekly - 3 sets - 5 reps - Seated Ankle Inversion with Resistance and Legs Crossed  - 1 x daily - 7 x weekly - 2 sets - 10 reps - Long Sitting Ankle Inversion with Anchored Resistance  - 1 x daily - 7 x weekly - 2 sets - 10 reps - Seated Ankle Eversion with Resistance  - 1 x daily - 7 x weekly - 2 sets - 10 reps - Forward T with Counter Support  - 1 x daily - 7 x weekly - 1-2 sets - 5-8 reps - Standing Partial Lunge  - 1 x daily - 7 x weekly - 2 sets - 5 reps - Seated Calf Raise With Small Ball at Heels  - 1 x daily - 7 x weekly - 2 sets - 10 reps - Standing Calf Raise With Small Ball at Heels  - 1 x daily - 7 x weekly - 2 sets - 10 reps -  Resisted Sit-to-Stand With Dumbbell Held in Shoulder Flexion  - 1 x daily - 7 x weekly - 1-2 sets - 10 reps - Seated Diagonal Chops with Medicine Ball  - 1 x daily - 7 x weekly - 1 sets - 10 reps - Standing Hip Abduction with Counter Support  - 1 x daily - 7 x weekly - 2 sets - 10 reps - Standing Hip Extension with Counter Support  - 1 x daily - 7 x weekly - 2 sets - 10 reps - Shoulder T Hinging at Hips  - 1 x daily - 7 x weekly - 1-2 sets - 5 reps - Half Deadlift with Kettlebell  - 1 x daily - 7 x weekly - 1-2 sets - 5 reps Pt Education:  [email or web address]  ASSESSMENT:  CLINICAL IMPRESSION:  Nathanel has made great progress with  skilled therapy. She has met all of her goals at this time. She got a membership at [employer] and she has been going to exercise classes frequently. She still has lingering head pains occasionally, but she has an appointment with sports med provider tomorrow. She is independent with HEP and highly compliant. Reviewed exercises and updated it today. Patient to discharge home with HEP.     Eval: Patient is a 68 y.o. female who was seen today for physical therapy evaluation and treatment for gait training. Nathanel presents with chronic bilateral foot and ankle that was re aggravated January 11, 2024 after a fall. She fell in her bathroom and she hit her head on the door threshold. She had imaging down and everything came back unremarkable. She does have a concussion and she is limited in her able to bend and pick up items and she gets occasional sharp pains in her head. She denies frequent headaches. She goal is to initial a gym routine and stay mobile. Patient would benefit from some sessions of aquatic therapy to establish an aquatic program to perform on her own. Patient is motivated and wants to get better. Patient will benefit from skilled PT to address the below impairments and improve overall function.    OBJECTIVE IMPAIRMENTS: Abnormal gait, decreased balance, decreased endurance, difficulty walking, decreased ROM, decreased strength, increased muscle spasms, prosthetic dependency , and pain.   ACTIVITY LIMITATIONS: carrying, lifting, bending, standing, squatting, stairs, transfers, and locomotion level  PARTICIPATION LIMITATIONS: meal prep, cleaning, laundry, interpersonal relationship, shopping, community activity, and church  PERSONAL FACTORS: Age, Fitness, and Time since onset of injury/illness/exacerbation are also affecting patient's functional outcome.   REHAB POTENTIAL: Good  CLINICAL DECISION MAKING: Evolving/moderate complexity  EVALUATION COMPLEXITY: Moderate   GOALS: Goals  reviewed with patient? Yes  SHORT TERM GOALS: Target date: 02/17/2024  Patient will be independent with initial HEP. Baseline:  Goal status: MET  2.  Patient will be able to participant a to establish a baseline for functional test. Baseline: will test next visit due to foot pain today (02/16/24) Goal status: MET 03/15/2024   3.  Patient will perform 5STS in < or = to 12 sec for improved functional mobility. Baseline:  Goal status: MET 04/02/2024   LONG TERM GOALS: Target date: 06/03/2024  Patient will demonstrate independence in advanced HEP. Baseline:  Goal status:MET 06/03/2024  2.  Patient will report > or = to 50% improvement in ankle and foot pain since starting PT. Baseline:  Goal status: MET (50%) 04/22/2024  3.  Patient will verbalize and demonstrate self-care strategies to manage pain including tissue mobility practices and change of  position. Baseline:  Goal status: MET 04/22/2024  4.  Patient will score > or = to 48/80 on LEFS due to improved function. Baseline: 38/80 Goal status: MET  (50/80) 04/02/2024  5.  Patient will be able to stand long enough to wash dishes with < or = to 2/10 back pain. Baseline: has to take rests Goal status: MET 03/15/2024  6.  Patient will demonstrate proper posture when bending and lifting to prevent back injury. Baseline:  Goal status:MET 06/03/2024  7.  Patient will be able to negotiate stairs with a reciprocal pattern with LRAD to improve functional mobility. Baseline:  Goal status:MET 06/03/2024  8.  Patient will ambulate > or = to 825ft with LRAD during for improved community mobility. Baseline:  Goal status:MET ( 853ft) 04/22/2024  9.  Patient will be able to perform a floor transfer safely using proper body mechanics to enhance safety with daily activities and reduce falls risk. Baseline:  Goal status:MET 06/03/2024    PLAN:  PT FREQUENCY: 1-2x/week  PT DURATION: 6 weeks  PLANNED INTERVENTIONS: 97164- PT  Re-evaluation, 97110-Therapeutic exercises, 97530- Therapeutic activity, 97112- Neuromuscular re-education, 97535- Self Care, 02859- Manual therapy, [phone]- Gait training, [phone]- Canalith repositioning, [phone]- Aquatic Therapy, [phone]- Electrical stimulation (unattended), [phone]- Electrical stimulation (manual), [id number]- Vasopneumatic device, [id number]- Ultrasound, [id number]- Ionotophoresis 4mg /ml Dexamethasone , 79439 (1-2 muscles), [id number] (3+ muscles)- Dry Needling, Patient/Family education, Balance training, Stair training, Taping, Joint mobilization, Joint manipulation, Spinal manipulation, Spinal mobilization, Vestibular training, Cryotherapy, and Moist heat  PLAN FOR NEXT SESSION: patient to discharge home with HEP  Kristeen Sar, PT, DPT 06/03/24 9:42 AM [facility] [address], [address] [place], [place] [address] Phone # [phone] Fax [phone]  PHYSICAL THERAPY DISCHARGE SUMMARY  Visits from Start of Care: 39  Current functional level related to goals / functional outcomes: See above   Remaining deficits: Occasional ankle pain but that is improving   Education / Equipment: See above   Patient agrees to discharge. Patient goals were met. Patient is being discharged due to meeting the stated rehab goals.

## 2024-06-04 ENCOUNTER — Encounter: Payer: Self-pay | Admitting: Family Medicine

## 2024-06-04 ENCOUNTER — Ambulatory Visit (INDEPENDENT_AMBULATORY_CARE_PROVIDER_SITE_OTHER): Admitting: Family Medicine

## 2024-06-04 ENCOUNTER — Ambulatory Visit: Attending: Family Medicine

## 2024-06-04 ENCOUNTER — Other Ambulatory Visit: Payer: Self-pay | Admitting: Family Medicine

## 2024-06-04 VITALS — BP 122/82 | HR 61 | Ht 66.0 in | Wt 303.0 lb

## 2024-06-04 DIAGNOSIS — R42 Dizziness and giddiness: Secondary | ICD-10-CM

## 2024-06-04 DIAGNOSIS — R600 Localized edema: Secondary | ICD-10-CM

## 2024-06-04 DIAGNOSIS — M25571 Pain in right ankle and joints of right foot: Secondary | ICD-10-CM

## 2024-06-04 DIAGNOSIS — G8929 Other chronic pain: Secondary | ICD-10-CM | POA: Diagnosis not present

## 2024-06-04 DIAGNOSIS — M25572 Pain in left ankle and joints of left foot: Secondary | ICD-10-CM

## 2024-06-04 NOTE — Patient Instructions (Signed)
 Thank you for coming in today.   We've placed a referral back to your Cardiologist in [place], [place]. We've also requested a long-term heart monitor and echocardiogram.   Continue your current home exercise program and classes at the gym. OK to back off some if your symptoms start to flare. If your symptoms get too bad, I recommend going to the ED for evaluation.   See you back as needed.

## 2024-06-04 NOTE — Progress Notes (Signed)
 I, Chelsea Bell, CMA acting as a scribe for Chelsea Lloyd, MD.  Chelsea Bell is a 68 y.o. female who presents to [facility] at [facility] today to discuss returning to exercise. Pt was last seen by Dr. Lloyd on 05/11/24 for bilat ankle pain. Pt was d/c from PT yesterday, after completing 39 visits.  Today, pt reports some improvement of sx ankle sx with compression, but able to tolerate longer activities better now. Has been using Voltaren Gel. PCP was prescribed by [facility].    Has been doing classes at [employer] in [place], balance and mobility. Started noticing some dizziness and HA whie doing exercises with her eyes closed. The trainer took her BP and noticed that it was still elevated several minutes after her workout. Her HR has also occasionally been running into the 170s while doing cardio. Trainer recommended having eval with Dr. Lloyd to discuss a plan for going forward with working out, because she enjoys and wants to continue working out.   Pertinent review of systems: No fevers or chills  Relevant historical information: Overactive bladder.  Patient did have a pretty thorough cardiac workup in 2023 with an echocardiogram and a 30-day monitor that was unremarkable.   Exam:  BP 122/82   Pulse 61   Ht 5' 6 (1.676 m)   Wt (!) 303 lb (137.4 kg)   SpO2 99%   BMI 48.91 kg/m  General: Well Developed, well nourished, and in no acute distress.   MSK: Ankles bilaterally normal motion.  Normal gait.    Lab and Radiology Results No results found for this or any previous visit (from the past 72 hours). No results found.     Assessment and Plan: 68 y.o. female with improving ankle pain and an overall improving concussion symptoms.  She did have an exacerbation of her vestibular ocular symptoms after doing a very challenging balance class.  Plan to reduce the intensity at bed but continue overall working on balance and coordination.  She has had some  flushing and some tachycardia likely related to her postconcussion situation.  However I would like to proceed with a further cardiac workup.  She had an echocardiogram and a heart monitor 2-1/2 years ago.  Will go ahead and order repeat echocardiogram and long-term monitor and refer her back to her cardiologist in [place].  Recommend scheduling that cardiology appointment for after she has these test done.  If her symptoms worsen she should go to the emergency room.  She is aware that we discussed exercise protocol   PDMP not reviewed this encounter. Orders Placed This Encounter  Procedures   Ambulatory referral to Cardiology    Referral Priority:   Routine    Referral Type:   Consultation    Referral Reason:   Specialty Services Required    Referred to Provider:   Alvan Dorn FALCON, MD    Number of Visits Requested:   1   LONG TERM MONITOR (3-14 DAYS)    Standing Status:   Future    Number of Occurrences:   1    Expiration Date:   06/04/2025    Where should this test be performed?:   [facility]    Does the patient have an implanted cardiac device?:   No    Prescribed days of wear:   7    Type of enrollment:   Clinic Enrollment   ECHOCARDIOGRAM COMPLETE    Standing Status:   Future    Expiration Date:  06/04/2025    Where should this test be performed:   [facility]    Perflutren  DEFINITY  (image enhancing agent) should be administered unless hypersensitivity or allergy exist:   Administer Perflutren     Reason for exam-Echo:   Dyspnea  R06.00   No orders of the defined types were placed in this encounter.    Discussed warning signs or symptoms. Please see discharge instructions. Patient expresses understanding.   The above documentation has been reviewed and is accurate and complete Chelsea Bell, M.D.  Total encounter time 30 minutes including face-to-face time with the patient and, reviewing past medical record, and charting on the date of service.

## 2024-06-04 NOTE — Progress Notes (Unsigned)
 EP to read.

## 2024-06-07 NOTE — Telephone Encounter (Signed)
 Pt called noting she experienced high pulse rate readings on her Apple Watch on Friday while on the elliptical, 170-200bpm, however she felt totally fine. The gym staff checked her heart rate w/ a pulse ox and reading were WNL, 88-90 bpm. She is wondering if the heart tests are really needed, since she thinks the issue may just be her watch. Please advise.

## 2024-06-08 NOTE — Telephone Encounter (Signed)
 Pt called back wanting an answer and to report findings from going to the gym last night. Was advised to proceed w/ heart tests per Dr. Virgilio recommendation. Pt verbalized understanding.

## 2024-06-08 NOTE — Telephone Encounter (Signed)
 I do think proceeding with the testing is appropriate.

## 2024-06-08 NOTE — Telephone Encounter (Signed)
-----   Message from Ileana GORMAN Collet sent at 06/07/2024 10:02 AM EST -----

## 2024-06-14 ENCOUNTER — Other Ambulatory Visit (HOSPITAL_BASED_OUTPATIENT_CLINIC_OR_DEPARTMENT_OTHER): Payer: Self-pay

## 2024-06-21 ENCOUNTER — Ambulatory Visit: Attending: Family Medicine

## 2024-06-27 DIAGNOSIS — R600 Localized edema: Secondary | ICD-10-CM

## 2024-06-27 DIAGNOSIS — R42 Dizziness and giddiness: Secondary | ICD-10-CM | POA: Diagnosis not present

## 2024-06-28 ENCOUNTER — Ambulatory Visit: Payer: Self-pay | Admitting: Family Medicine

## 2024-06-28 NOTE — Progress Notes (Signed)
 Long-term monitor does not show any dangerous rhythms.

## 2024-07-06 ENCOUNTER — Ambulatory Visit: Attending: Family Medicine

## 2024-07-06 DIAGNOSIS — R600 Localized edema: Secondary | ICD-10-CM

## 2024-07-06 DIAGNOSIS — R42 Dizziness and giddiness: Secondary | ICD-10-CM | POA: Diagnosis not present

## 2024-07-06 LAB — ECHOCARDIOGRAM COMPLETE
AR max vel: 1.55 cm2
AV Area VTI: 1.81 cm2
AV Area mean vel: 1.68 cm2
AV Mean grad: 7 mmHg
AV Peak grad: 14.7 mmHg
AV Vena cont: 0.9 cm
Ao pk vel: 1.92 m/s
Area-P 1/2: 2.19 cm2
Calc EF: 53.7 %
MV VTI: 1.99 cm2
P 1/2 time: 797 ms
S' Lateral: 4 cm
Single Plane A2C EF: 57.3 %
Single Plane A4C EF: 52.8 %

## 2024-07-06 MED ORDER — PERFLUTREN LIPID MICROSPHERE
1.0000 mL | INTRAVENOUS | Status: AC | PRN
  Administered 2024-07-06: 14:00:00 8 mL via INTRAVENOUS

## 2024-07-07 NOTE — Progress Notes (Signed)
 Echocardiogram is not completely normal but it is not terrible either.  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  I am not confident this explains your symptoms well enough.  You have an appointment scheduled with cardiology on 14 January which I think is a good idea to keep.

## 2024-07-19 ENCOUNTER — Other Ambulatory Visit: Payer: Self-pay | Admitting: Internal Medicine

## 2024-08-04 ENCOUNTER — Encounter: Payer: Self-pay | Admitting: Cardiology

## 2024-08-04 ENCOUNTER — Encounter (HOSPITAL_COMMUNITY): Payer: Self-pay | Admitting: Internal Medicine

## 2024-08-04 ENCOUNTER — Telehealth (HOSPITAL_BASED_OUTPATIENT_CLINIC_OR_DEPARTMENT_OTHER): Payer: Self-pay

## 2024-08-04 ENCOUNTER — Encounter: Payer: Self-pay | Admitting: *Deleted

## 2024-08-04 ENCOUNTER — Ambulatory Visit: Attending: Cardiology | Admitting: Cardiology

## 2024-08-04 VITALS — BP 148/76 | HR 54 | Ht 66.0 in | Wt 305.8 lb

## 2024-08-04 DIAGNOSIS — G4733 Obstructive sleep apnea (adult) (pediatric): Secondary | ICD-10-CM

## 2024-08-04 DIAGNOSIS — R42 Dizziness and giddiness: Secondary | ICD-10-CM | POA: Diagnosis not present

## 2024-08-04 MED ORDER — FUROSEMIDE 20 MG PO TABS: 20.0000 mg | ORAL_TABLET | ORAL | 3 refills | Status: AC | PRN

## 2024-08-04 NOTE — Telephone Encounter (Signed)
" °  PT HAS APPT WITH DR. BRANCH TODAY AT 2:40 - I added PreOp clearance to appt notes        Pre-operative Risk Assessment    Patient Name: Chelsea Bell  DOB: Mar 21, 1956 MRN: [id number]   Date of last office visit: 12/24/2021 with Dr. Alvan Date of next office visit: 08/04/2024 with Dr. Alvan  Request for Surgical Clearance    Procedure:  Colonoscopy  Date of Surgery:  Clearance 08/17/24                                 Surgeon:  Dr. Estefana Keas Surgeon's Group or Practice Name:  [facility] Phone number:  [phone] Fax number:  [phone]   Type of Clearance Requested:   - Medical    Type of Anesthesia:  Propofol    Additional requests/questions:  colon cancer screening and family h/o colonic polyps   Signed, [name]   08/04/2024, 11:19 AM   "

## 2024-08-04 NOTE — Telephone Encounter (Signed)
 Patient has not been seen by our office since 2023. However, he has an appointment with Dr. Alvan scheduled for this afternoon. Therefore, pre-op evaluation can be addressed at that time.  Will remove from pre-op pool.

## 2024-08-04 NOTE — Patient Instructions (Signed)
 Medication Instructions:   Begin Lasix  20mg  as needed for swelling  Continue all other medications.     Labwork:  none  Testing/Procedures:  none  Follow-Up:  6 months   Any Other Special Instructions Will Be Listed Below (If Applicable).  You have been referred to:  Pulmonology   If you need a refill on your cardiac medications before your next appointment, please call your pharmacy.

## 2024-08-04 NOTE — Progress Notes (Signed)
 Chelsea Bell  Procedure Date: 08/17/24   PCP-Golding MD  Cardiologist-Branch MD Pulmonologist-n/a  EKG-10/08/21 Echo-07/06/24 Cath-n/a     Stress-n/a ICD/PM-n/a GLP1-n/a Blood Thinner-n/a   Patient History: hx of endometrial cancer, some ankle injury/pains. She was at sports med 06/04/24 for PT and the trainer noticed her HR getting high into 170s and had some dizziness so he brought that to MD attention. He ordered an echo and long term monitor both done he put the results in the notes. 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 He referred her back to cardiology and she has appt today @1430 . Anesthesia Review- Yes- add cardiac clearance to existing appt 1/14. Called GI office spoke with Inocente, she said she will send now.

## 2024-08-04 NOTE — Progress Notes (Signed)
 "     Clinical Summary Chelsea Bell is a 69 y.o.female seen today as a new consult, referred by Dr Joane for the folowing medical problems.     1.Dizziness  09/2021 echo LVEF 50-55%, no WMAs - 09/2021 30 day monitor: SR, rare PACs and PVCs, no significant arrhythmias   06/2024 echo: LVEF 55-60%, no WMAs, grade I dd, normal RV function 06/2024 monitor: rare ectopy, 8 runs of SVT short in duration. Overall no significant findings. Max HR 130, avg 71 - prior concussion fell June 22   - has been working at gym - while doing activity with eyes closed, started having some dizziness and lightheadedness. No other associated symptoms. Dizziness resolved just a few minutes - vitals were checked and elevated blood pressure - no recurrent systems - working to stay well hydrated, at least 2 liters of water daily    2. Aortic regurgitation - mild to mod by 06/2024 echo - ascending aorta 43 mm    3.Chest pain/dizzienss - prior evaluation in 2023 -admit 09/2021 with dizziness and chest pressure - negative workup for ACS with EKG and enzymes, ddimer neg - 09/2021 echo LVEF 50-55%, no WMAs - 09/2021 30 day monitor: SR, rare PACs and PVCs, no significant arrhythmias        4. Cardiac screening - coronary calcium score was 0 by outside provider 09/2021  5. Preoperative evaluation - plans for colonscopy   6. OSA screen - some daytime somnolence, +snoring  Past Medical History:  Diagnosis Date   Cancer (HCC)    endometrial cancer- surgery only   Concussion    GERD (gastroesophageal reflux disease)      Allergies[1]   Current Outpatient Medications  Medication Sig Dispense Refill   celecoxib (CELEBREX) 200 MG capsule Take 200 mg by mouth 2 (two) times daily as needed.     loratadine (CLARITIN REDITABS) 10 MG dissolvable tablet Take 10 mg by mouth daily.     mirabegron  ER (MYRBETRIQ ) 50 MG TB24 tablet Take 1 tablet (50 mg total) by mouth daily. 30 tablet 11   pantoprazole  (PROTONIX) 40 MG tablet Take 40 mg by mouth daily.     No current facility-administered medications for this visit.     Past Surgical History:  Procedure Laterality Date   ABDOMINAL HYSTERECTOMY     COLONOSCOPY WITH PROPOFOL  N/A 06/09/2014   Procedure: COLONOSCOPY WITH PROPOFOL ;  Surgeon: Lamar Donnald GAILS, MD;  Location: [facility];  Service: Endoscopy;  Laterality: N/A;   DILATION AND CURETTAGE OF UTERUS     LAPAROSCOPIC OVARIAN     diagnostic with 1 ovary removed , then Hysterectomy(endometrial cancer) with other ovary removed   TONSILLECTOMY       Allergies[2]    Family History  Problem Relation Age of Onset   Heart failure Mother    Hyperlipidemia Mother    Diabetes Mother    Hypertension Mother    Thyroid  disease Mother    Osteoporosis Mother    Arthritis Mother    Cancer Father        Ear   Cerebrovascular Accident Father    Melanoma Father    Parkinson's disease Father    Stroke Father    Neuropathy Father    Hyperlipidemia Sister    Asthma Sister    Hypertension Sister    Irritable bowel syndrome Sister    Diabetes Maternal Grandmother    Arthritis Maternal Grandmother    Osteoporosis Maternal Grandmother    Heart disease Paternal Grandfather  Breast cancer Paternal Uncle    Bladder Cancer Neg Hx    Renal cancer Neg Hx    Uterine cancer Neg Hx      Social History Chelsea Bell reports that she has never smoked. She has never used smokeless tobacco. Chelsea Bell reports no history of alcohol use.    Physical Examination Today's Vitals   08/04/24 1444  BP: (!) 148/76  Pulse: (!) 54  SpO2: 98%  Weight: (!) 305 lb 12.8 oz (138.7 kg)  Height: 5' 6 (1.676 m)   Body mass index is 49.36 kg/m.  Gen: resting comfortably, no acute distress HEENT: no scleral icterus, pupils equal round and reactive, no palptable cervical adenopathy,  CV: RRR, no m/rg, no jvd Resp: Clear to auscultation bilaterally GI: abdomen is soft, non-tender, non-distended,  normal bowel sounds, no hepatosplenomegaly MSK: extremities are warm, no edema.  Skin: warm, no rash Neuro:  no focal deficits Psych: appropriate affect     Assessment and Plan  1.Dizziness - no clear cardiac etiology. Echo and monitor were benign - orthostastics negative today, working to stay well hydrated - has not had significant recurrent symptoms - no further cardiac workup at this time, if recurrence consider neuro eval  2. Aortic regurgitation - repeat echo 2 years  3. Preoperative evaluation - ok to proceed with colonscopy from cardiac standpoint.   4. OSA screen - signs and symptoms of OSA, BMI 49 - refer to pulmonary for evaluation   Dorn PHEBE Ross, M.D     [1]  Allergies Allergen Reactions   Dilaudid [Hydromorphone Hcl] Itching   Oxycodone-Acetaminophen  Itching   Percocet [Oxycodone-Acetaminophen ] Itching   Amoxicillin Itching    + headache.    Codeine Itching    + Headache.    Nickel Dermatitis    itching, skin blisters  [2]  Allergies Allergen Reactions   Dilaudid [Hydromorphone Hcl] Itching   Oxycodone-Acetaminophen  Itching   Percocet [Oxycodone-Acetaminophen ] Itching   Amoxicillin Itching    + headache.    Codeine Itching    + Headache.    Nickel Dermatitis    itching, skin blisters   "

## 2024-08-04 NOTE — Telephone Encounter (Signed)
 Noted

## 2024-08-05 ENCOUNTER — Encounter: Payer: Self-pay | Admitting: General Practice

## 2024-08-05 NOTE — Telephone Encounter (Signed)
 Ok to proceed with colonscopy from cardiac standpoing  JINNY Ross MD

## 2024-08-09 ENCOUNTER — Encounter: Payer: Self-pay | Admitting: Cardiology

## 2024-08-13 ENCOUNTER — Telehealth (HOSPITAL_COMMUNITY): Payer: Self-pay

## 2024-08-13 NOTE — Telephone Encounter (Signed)
 Chelsea Bell was scheduled for Colonoscopy with Dr. Kriss on 08/17/24, at [facility] hospital.   Patient/or family called on 08/13/24 to cancel their procedure due to impending inclement weather.  Dr Kriss & office notified. Patient instructed to call physician's office to reschedule their procedure. Patient demonstrated understanding.

## 2024-08-17 ENCOUNTER — Ambulatory Visit (HOSPITAL_COMMUNITY): Admission: RE | Admit: 2024-08-17 | Source: Home / Self Care | Admitting: Internal Medicine

## 2024-08-17 ENCOUNTER — Encounter (HOSPITAL_COMMUNITY): Admission: RE | Payer: Self-pay | Source: Home / Self Care

## 2024-08-24 ENCOUNTER — Encounter: Payer: Self-pay | Admitting: Cardiology

## 2024-09-24 ENCOUNTER — Ambulatory Visit: Payer: Self-pay | Admitting: Allergy & Immunology

## 2024-09-29 ENCOUNTER — Ambulatory Visit: Admitting: Pulmonary Disease
